# Patient Record
Sex: Male | Born: 1935
Health system: Southern US, Community
[De-identification: ages and names within clinical notes are randomized; demographics above are authoritative.]

## PROBLEM LIST (undated history)

## (undated) DIAGNOSIS — M1990 Unspecified osteoarthritis, unspecified site: Secondary | ICD-10-CM

## (undated) DIAGNOSIS — I10 Essential (primary) hypertension: Secondary | ICD-10-CM

## (undated) DIAGNOSIS — I2510 Atherosclerotic heart disease of native coronary artery without angina pectoris: Secondary | ICD-10-CM

## (undated) DIAGNOSIS — I219 Acute myocardial infarction, unspecified: Secondary | ICD-10-CM

## (undated) DIAGNOSIS — I209 Angina pectoris, unspecified: Secondary | ICD-10-CM

## (undated) DIAGNOSIS — I4892 Unspecified atrial flutter: Secondary | ICD-10-CM

## (undated) DIAGNOSIS — N189 Chronic kidney disease, unspecified: Secondary | ICD-10-CM

## (undated) DIAGNOSIS — E785 Hyperlipidemia, unspecified: Secondary | ICD-10-CM

## (undated) DIAGNOSIS — E7800 Pure hypercholesterolemia, unspecified: Secondary | ICD-10-CM

## (undated) HISTORY — DX: Pure hypercholesterolemia, unspecified: E78.00

## (undated) HISTORY — DX: Atherosclerotic heart disease of native coronary artery without angina pectoris: I25.10

## (undated) HISTORY — PX: OTHER SURGICAL HISTORY: SHX169

## (undated) HISTORY — DX: Hyperlipidemia, unspecified: E78.5

## (undated) HISTORY — DX: Unspecified atrial flutter: I48.92

---

## 2004-12-29 ENCOUNTER — Ambulatory Visit: Payer: Self-pay | Admitting: Cardiology

## 2006-04-18 ENCOUNTER — Ambulatory Visit: Payer: Self-pay | Admitting: Cardiology

## 2006-05-11 ENCOUNTER — Emergency Department (HOSPITAL_COMMUNITY): Admission: EM | Admit: 2006-05-11 | Discharge: 2006-05-11 | Payer: Self-pay | Admitting: Emergency Medicine

## 2006-05-24 ENCOUNTER — Emergency Department (HOSPITAL_COMMUNITY): Admission: EM | Admit: 2006-05-24 | Discharge: 2006-05-24 | Payer: Self-pay | Admitting: *Deleted

## 2007-02-11 ENCOUNTER — Ambulatory Visit: Payer: Self-pay | Admitting: Cardiology

## 2007-02-12 ENCOUNTER — Encounter: Payer: Self-pay | Admitting: Cardiology

## 2008-06-24 ENCOUNTER — Encounter: Payer: Self-pay | Admitting: Physician Assistant

## 2008-06-24 ENCOUNTER — Ambulatory Visit: Payer: Self-pay | Admitting: Cardiology

## 2008-06-30 ENCOUNTER — Encounter: Payer: Self-pay | Admitting: Physician Assistant

## 2008-09-22 ENCOUNTER — Encounter: Payer: Self-pay | Admitting: Cardiology

## 2009-10-18 ENCOUNTER — Ambulatory Visit: Payer: Self-pay | Admitting: Cardiology

## 2009-10-18 DIAGNOSIS — E7800 Pure hypercholesterolemia, unspecified: Secondary | ICD-10-CM

## 2009-10-18 DIAGNOSIS — I2510 Atherosclerotic heart disease of native coronary artery without angina pectoris: Secondary | ICD-10-CM

## 2009-10-25 ENCOUNTER — Encounter: Payer: Self-pay | Admitting: Cardiology

## 2009-10-28 ENCOUNTER — Encounter: Payer: Self-pay | Admitting: Cardiology

## 2010-01-03 ENCOUNTER — Encounter: Payer: Self-pay | Admitting: Cardiology

## 2010-01-03 ENCOUNTER — Encounter (INDEPENDENT_AMBULATORY_CARE_PROVIDER_SITE_OTHER): Payer: Self-pay | Admitting: *Deleted

## 2010-01-10 ENCOUNTER — Encounter: Payer: Self-pay | Admitting: Cardiology

## 2010-04-12 ENCOUNTER — Encounter: Payer: Self-pay | Admitting: Cardiology

## 2010-05-22 ENCOUNTER — Encounter: Payer: Self-pay | Admitting: Cardiology

## 2010-09-26 NOTE — Miscellaneous (Signed)
Summary: remove caduet  Clinical Lists Changes  Medications: Removed medication of CADUET 10-20 MG TABS (AMLODIPINE-ATORVASTATIN) Take 1 tablet by mouth once a day

## 2010-09-26 NOTE — Miscellaneous (Signed)
Summary: rx - amlodipine, lipitor  Clinical Lists Changes  Medications: Changed medication from AMLODIPINE BESYLATE 5 MG TABS (AMLODIPINE BESYLATE) Take 1 tablet by mouth once a day to AMLODIPINE BESYLATE 5 MG TABS (AMLODIPINE BESYLATE) Take 1 tablet by mouth once a day - Signed Added new medication of LIPITOR 80 MG TABS (ATORVASTATIN CALCIUM) Take 1 tab by mouth at bedtime - Signed Rx of AMLODIPINE BESYLATE 5 MG TABS (AMLODIPINE BESYLATE) Take 1 tablet by mouth once a day;  #30 x 6;  Signed;  Entered by: Hoover Brunette, LPN;  Authorized by: Lewayne Bunting, MD, [facility];  Method used: Electronically to [facility] [phone]*, [address] [place], [place], [place]  [address], Ph: [phone] or [phone], Fax: [phone] Rx of LIPITOR 80 MG TABS (ATORVASTATIN CALCIUM) Take 1 tab by mouth at bedtime;  #30 x 6;  Signed;  Entered by: Hoover Brunette, LPN;  Authorized by: Lewayne Bunting, MD, [facility];  Method used: Electronically to [facility] [phone]*, [address], [place], [place], [place]  [address], Ph: [phone] or [phone], Fax: [phone]    Prescriptions: LIPITOR 80 MG TABS (ATORVASTATIN CALCIUM) Take 1 tab by mouth at bedtime  #30 x 6   Entered by:   Hoover Brunette, LPN   Authorized by:   Lewayne Bunting, MD, [facility]   Signed by:   Hoover Brunette, LPN on 36/14/4315   Method used:   Electronically to        [facility] [phone]* (retail)       [address] [place], [place]  [address]       Ph: [phone] or [phone]       Fax: [phone]   RxID:   [id number] AMLODIPINE BESYLATE 5 MG TABS (AMLODIPINE BESYLATE) Take 1 tablet by mouth once a day  #30 x 6   Entered by:   Hoover Brunette, LPN   Authorized by:   Lewayne Bunting, MD, [facility]   Signed by:   Hoover Brunette, LPN on 92/42/6834   Method used:   Electronically to        [facility] [phone]* (retail)       [address] [place], [place]  [address]       Ph:  [phone] or [phone]       Fax: [phone]   RxID:   [id number]

## 2010-09-26 NOTE — Assessment & Plan Note (Signed)
Summary: 6 mo fu that was due in april   Visit Type:  Follow-up Primary Provider:  none  CC:  follow-up visit.  History of Present Illness: the patient is 77 year old male with multiple cardiac risk factors. In 2008 the patient an abnormal stress Cardiolite study but refused cardiac catheterization on multiple occasions. From cardiac standpoint is actually been doing quite well. He denies any recurrent chest pain shortness of breath orthopnea PND. The patient continues to work and experienced no limitations in his lifestyle. He reports no palpitations presyncope or syncope. The patient has not had any recent blood work. His vital signs today were stable. He does report that he has difficulty affording Caduet  Preventive Screening-Counseling & Management  Alcohol-Tobacco     Smoking Status: never  Current Problems (verified): 1)  Pure Hypercholesterolemia  (ICD-272.0) 2)  Coronary Atherosclerosis Native Coronary Artery  (ICD-414.01)  Current Medications (verified): 1)  Amlodipine Besylate 5 Mg Tabs (Amlodipine Besylate) .... Take 1 Tablet By Mouth Once A Day 2)  Caduet 10-20 Mg Tabs (Amlodipine-Atorvastatin) .... Take 1 Tablet By Mouth Once A Day 3)  Hydrochlorothiazide 25 Mg Tabs (Hydrochlorothiazide) .... Take 1 Tablet By Mouth Once A Day 4)  Isosorbide Dinitrate 20 Mg Tabs (Isosorbide Dinitrate) .... Take 1 Tablet By Mouth Twice A Day 5)  Metoprolol Tartrate 50 Mg Tabs (Metoprolol Tartrate) .... Take 1 Tablet By Mouth Twice A Day 6)  Fish Oil 1000 Mg Caps (Omega-3 Fatty Acids) .... Take 2 Tablet By Mouth Two Times A Day 7)  Aspir-Low 81 Mg Tbec (Aspirin) .... Take 1 Tablet By Mouth Once A Day 8)  Allopurinol 300 Mg Tabs (Allopurinol) .... Take 1 Tablet By Mouth Once A Day 9)  Doxazosin Mesylate 8 Mg Tabs (Doxazosin Mesylate) .... Take 1 Tablet By Mouth Once A Day  Allergies (verified): No Known Drug Allergies  Comments:  Nurse/Medical Assistant: The patient's medications and  allergies were reviewed with the patient and were updated in the Medication and Allergy Lists. List reviewed.  Past History:  Past Medical History: Last updated: 06/23/2008   Abnormal Cardiolite stress study with presumed coronary artery disease, asymptomatic patient declines cardiac catheterization in the past hypertension, uncontrolled obesity chronic venous insufficiency history of alcohol use.  Family History: noncontributory  Social History: Tobacco Use - No.  Smoking Status:  never  Review of Systems  The patient denies fatigue, malaise, fever, weight gain/loss, vision loss, decreased hearing, hoarseness, chest pain, palpitations, shortness of breath, prolonged cough, wheezing, sleep apnea, coughing up blood, abdominal pain, blood in stool, nausea, vomiting, diarrhea, heartburn, incontinence, blood in urine, muscle weakness, joint pain, leg swelling, rash, skin lesions, headache, fainting, dizziness, depression, anxiety, enlarged lymph nodes, easy bruising or bleeding, and environmental allergies.    Vital Signs:  Patient profile:   77 year old male Height:      71 inches Weight:      305 pounds BMI:     42.69 Pulse rate:   72 / minute BP sitting:   136 / 78  (left arm) Cuff size:   large  Vitals Entered By: Carlye Grippe (October 18, 2009 10:58 AM)  Nutrition Counseling: Patient's BMI is greater than 25 and therefore counseled on weight management options. CC: follow-up visit   Physical Exam  Additional Exam:  General: Well-developed, well-nourished in no distress head: Normocephalic and atraumatic eyes PERRLA/EOMI intact, conjunctiva and lids normal nose: No deformity or lesions mouth normal dentition, normal posterior pharynx neck: Supple, no JVD.  No masses,  thyromegaly or abnormal cervical nodes lungs: Normal breath sounds bilaterally without wheezing.  Normal percussion heart: regular rate and rhythm with normal S1 and S2, no S3 or S4.  PMI is normal.   No pathological murmurs abdomen: Normal bowel sounds, abdomen is soft and nontender without masses, organomegaly or hernias noted.  No hepatosplenomegaly musculoskeletal: Back normal, normal gait muscle strength and tone normal pulsus: Pulse is normal in all 4 extremities Extremities: No peripheral pitting edema neurologic: Alert and oriented x 3 skin: Intact without lesions or rashes cervical nodes: No significant adenopathy psychologic: Normal affect    EKG  Procedure date:  10/18/2009  Findings:      normal sinus rhythm. Heart rate 66 beats per minute nonspecific ST-T wave changes.  Impression & Recommendations:  Problem # 1:  CORONARY ATHEROSCLEROSIS NATIVE CORONARY ARTERY (ICD-414.01) patient had several years ago and abnormal cardioid study but he denies any chest pain. He continues to decline cardiac catheterization although there is no clear in addition to proceed with one currently. His updated medication list for this problem includes:    Amlodipine Besylate 5 Mg Tabs (Amlodipine besylate) .Marland Kitchen... Take 1 tablet by mouth once a day    Isosorbide Dinitrate 20 Mg Tabs (Isosorbide dinitrate) .Marland Kitchen... Take 1 tablet by mouth twice a day    Metoprolol Tartrate 50 Mg Tabs (Metoprolol tartrate) .Marland Kitchen... Take 1 tablet by mouth twice a day    Aspir-low 81 Mg Tbec (Aspirin) .Marland Kitchen... Take 1 tablet by mouth once a day  Orders: T-CBC No Diff ([id number]) T-Comprehensive Metabolic Panel (14782-95621) T-PSA (30865-78469) T-TSH (62952-84132) T-Lipid Profile (44010-27253)  Problem # 2:  PURE HYPERCHOLESTEROLEMIA (ICD-272.0) the patient did not have blood work in at least a year. He also does not have a primary care physician. I ordered blood work as listed below. His updated medication list for this problem includes:    Caduet 10-20 Mg Tabs (Amlodipine-atorvastatin) .Marland Kitchen... Take 1 tablet by mouth once a day  Orders: T-CBC No Diff ([id number]) T-Comprehensive Metabolic Panel  (59563-87564) T-PSA (33295-18841) T-TSH (66063-01601) T-Lipid Profile (09323-55732)  Other Orders: EKG w/ Interpretation (93000)  Patient Instructions: 1)  Your physician recommends that you continue on your current medications as directed. Please refer to the Current Medication list given to you today. 2)  Labs - can do next week.   3)  Follow up in  1 year.

## 2010-09-26 NOTE — Miscellaneous (Signed)
Summary: Orders Update - FLP,LFT  Clinical Lists Changes  Orders: Added new Test order of T-Lipid Profile (80061-22930) - Signed Added new Test order of T-Hepatic Function (80076-22960) - Signed 

## 2010-09-26 NOTE — Miscellaneous (Signed)
Summary: Orders Update - FLP/LFT  Clinical Lists Changes  Orders: Added new Test order of T-Lipid Profile (80061-22930) - Signed Added new Test order of T-Hepatic Function (80076-22960) - Signed 

## 2010-09-26 NOTE — Letter (Signed)
Summary: Generic Engineer, agricultural at [facility] [address]   [place], [place] [address]   Phone: [phone]  Fax: [phone]        Jan 03, 2010 MRN: [id number]    [facility] [address] [place], [place]  [address]    Dear [name],  According to our records, it is now time for your follow up lab work.  Please take the enclosed order to the [facility] at your earliest convenience.   Reminder:  Nothing to eat or drink after 12 midnight prior to labs.       Sincerely,  Hoover Brunette, LPN  This letter has been electronically signed by your physician.

## 2011-01-04 ENCOUNTER — Emergency Department (INDEPENDENT_AMBULATORY_CARE_PROVIDER_SITE_OTHER): Payer: Medicare Other

## 2011-01-04 ENCOUNTER — Emergency Department (HOSPITAL_BASED_OUTPATIENT_CLINIC_OR_DEPARTMENT_OTHER)
Admission: EM | Admit: 2011-01-04 | Discharge: 2011-01-04 | Disposition: A | Discharge status: Other Acute Inpatient Hospital | Payer: Medicare Other | Source: Home / Self Care | Attending: Emergency Medicine | Admitting: Emergency Medicine

## 2011-01-04 ENCOUNTER — Inpatient Hospital Stay (HOSPITAL_COMMUNITY)
Admission: AD | Admit: 2011-01-04 | Discharge: 2011-01-06 | DRG: 872 | Disposition: A | Discharge status: Home / Self Care | Payer: Medicare Other | Source: Other Acute Inpatient Hospital | Attending: Internal Medicine | Admitting: Internal Medicine

## 2011-01-04 DIAGNOSIS — R0602 Shortness of breath: Secondary | ICD-10-CM | POA: Insufficient documentation

## 2011-01-04 DIAGNOSIS — N400 Enlarged prostate without lower urinary tract symptoms: Secondary | ICD-10-CM | POA: Diagnosis present

## 2011-01-04 DIAGNOSIS — N390 Urinary tract infection, site not specified: Secondary | ICD-10-CM | POA: Insufficient documentation

## 2011-01-04 DIAGNOSIS — R509 Fever, unspecified: Secondary | ICD-10-CM | POA: Insufficient documentation

## 2011-01-04 DIAGNOSIS — A419 Sepsis, unspecified organism: Secondary | ICD-10-CM | POA: Diagnosis present

## 2011-01-04 DIAGNOSIS — I10 Essential (primary) hypertension: Secondary | ICD-10-CM | POA: Insufficient documentation

## 2011-01-04 DIAGNOSIS — M109 Gout, unspecified: Secondary | ICD-10-CM | POA: Diagnosis present

## 2011-01-04 DIAGNOSIS — R55 Syncope and collapse: Secondary | ICD-10-CM | POA: Insufficient documentation

## 2011-01-04 DIAGNOSIS — J984 Other disorders of lung: Secondary | ICD-10-CM | POA: Diagnosis present

## 2011-01-04 DIAGNOSIS — E876 Hypokalemia: Secondary | ICD-10-CM | POA: Diagnosis present

## 2011-01-04 DIAGNOSIS — E785 Hyperlipidemia, unspecified: Secondary | ICD-10-CM | POA: Diagnosis present

## 2011-01-04 DIAGNOSIS — I2510 Atherosclerotic heart disease of native coronary artery without angina pectoris: Secondary | ICD-10-CM | POA: Diagnosis present

## 2011-01-04 DIAGNOSIS — E860 Dehydration: Secondary | ICD-10-CM | POA: Diagnosis present

## 2011-01-04 DIAGNOSIS — A4151 Sepsis due to Escherichia coli [E. coli]: Principal | ICD-10-CM | POA: Diagnosis present

## 2011-01-04 DIAGNOSIS — R5381 Other malaise: Secondary | ICD-10-CM | POA: Diagnosis present

## 2011-01-04 LAB — COMPREHENSIVE METABOLIC PANEL
Albumin: 3.5 g/dL (ref 3.5–5.2)
Alkaline Phosphatase: 72 U/L (ref 39–117)
BUN: 16 mg/dL (ref 6–23)
Creatinine, Ser: 1.1 mg/dL (ref 0.4–1.5)
Glucose, Bld: 136 mg/dL — ABNORMAL HIGH (ref 70–99)
Potassium: 3.1 mEq/L — ABNORMAL LOW (ref 3.5–5.1)
Total Bilirubin: 2.1 mg/dL — ABNORMAL HIGH (ref 0.3–1.2)
Total Protein: 7.6 g/dL (ref 6.0–8.3)

## 2011-01-04 LAB — CBC
HCT: 39.6 % (ref 39.0–52.0)
MCH: 31 pg (ref 26.0–34.0)
MCV: 91 fL (ref 78.0–100.0)
Platelets: 156 10*3/uL (ref 150–400)
RDW: 13.6 % (ref 11.5–15.5)

## 2011-01-04 LAB — DIFFERENTIAL
Eosinophils Absolute: 0 10*3/uL (ref 0.0–0.7)
Eosinophils Relative: 0 % (ref 0–5)
Lymphocytes Relative: 10 % — ABNORMAL LOW (ref 12–46)
Lymphs Abs: 1.3 10*3/uL (ref 0.7–4.0)
Monocytes Absolute: 1.8 10*3/uL — ABNORMAL HIGH (ref 0.1–1.0)
Monocytes Relative: 13 % — ABNORMAL HIGH (ref 3–12)

## 2011-01-04 LAB — URINALYSIS, ROUTINE W REFLEX MICROSCOPIC
Glucose, UA: NEGATIVE mg/dL
Hgb urine dipstick: NEGATIVE
Protein, ur: 100 mg/dL — AB
Specific Gravity, Urine: 1.033 — ABNORMAL HIGH (ref 1.005–1.030)
Urobilinogen, UA: 2 mg/dL — ABNORMAL HIGH (ref 0.0–1.0)

## 2011-01-04 LAB — URINE MICROSCOPIC-ADD ON

## 2011-01-05 LAB — CBC
HCT: 38.1 % — ABNORMAL LOW (ref 39.0–52.0)
Hemoglobin: 12.7 g/dL — ABNORMAL LOW (ref 13.0–17.0)
MCH: 30.8 pg (ref 26.0–34.0)
MCV: 92.5 fL (ref 78.0–100.0)
RBC: 4.12 MIL/uL — ABNORMAL LOW (ref 4.22–5.81)
WBC: 11.8 10*3/uL — ABNORMAL HIGH (ref 4.0–10.5)

## 2011-01-05 LAB — BASIC METABOLIC PANEL
BUN: 14 mg/dL (ref 6–23)
Calcium: 8.4 mg/dL (ref 8.4–10.5)
Creatinine, Ser: 0.98 mg/dL (ref 0.4–1.5)
GFR calc non Af Amer: >60 mL/min (ref >60)
Glucose, Bld: 103 mg/dL — ABNORMAL HIGH (ref 70–99)
Potassium: 3 mEq/L — ABNORMAL LOW (ref 3.5–5.1)

## 2011-01-05 LAB — HEPATIC FUNCTION PANEL
ALT: 13 U/L (ref 0–53)
AST: 16 U/L (ref 0–37)
Alkaline Phosphatase: 67 U/L (ref 39–117)
Bilirubin, Direct: 0.4 mg/dL — ABNORMAL HIGH (ref 0.0–0.3)
Indirect Bilirubin: 1 mg/dL — ABNORMAL HIGH (ref 0.3–0.9)
Total Bilirubin: 1.4 mg/dL — ABNORMAL HIGH (ref 0.3–1.2)

## 2011-01-05 LAB — POTASSIUM: Potassium: 3.1 mEq/L — ABNORMAL LOW (ref 3.5–5.1)

## 2011-01-06 LAB — CBC
HCT: 39 % (ref 39.0–52.0)
Platelets: 157 10*3/uL (ref 150–400)
RBC: 4.17 MIL/uL — ABNORMAL LOW (ref 4.22–5.81)
RDW: 13.4 % (ref 11.5–15.5)
WBC: 6.9 10*3/uL (ref 4.0–10.5)

## 2011-01-06 LAB — BASIC METABOLIC PANEL
Chloride: 104 mEq/L (ref 96–112)
GFR calc non Af Amer: >60 mL/min (ref >60)
Glucose, Bld: 86 mg/dL (ref 70–99)
Potassium: 3.4 mEq/L — ABNORMAL LOW (ref 3.5–5.1)
Sodium: 140 mEq/L (ref 135–145)

## 2011-01-06 LAB — URINE CULTURE: Colony Count: >=100000

## 2011-01-09 NOTE — Assessment & Plan Note (Signed)
[facility]                          [facility] OFFICE NOTE   NAME:Marcus [name], Marcus [name]                        MRN:          [id number]  DATE:06/24/2008                            DOB:          November 06, 1932    PRIMARY CARDIOLOGIST:  Learta Codding, MD, [facility]   REASON FOR VISIT:  Annual followup.   Marcus [name] denies any interim development of angina pectoris, since last  seen here in June 2008.  He has multiple cardiac risk factors and  presumed CAD, with history of an abnormal stress Cardiolite study.  However, he has refused a cardiac catheterization on multiple occasions.   Of note, Marcus [name] has never smoked tobacco.  He has not had any blood  work since his last office visit.  At that time, he had normal  electrolytes and mild renal insufficiency with a creatinine of 1.4.  TSH  was normal.  Hemoglobin was normal, as well.   EKG today reveals NSR at 63 bpm with left axis deviation and nonspecific  ST abnormalities; no significant change from his previous study.   CURRENT MEDICATIONS:  1. Isosorbide 20 b.i.d.  2. Hydrochlorothiazide 25 daily.  3. Fish oil 1000 b.i.d.  4. Ecotrin 500 b.i.d.  5. Allopurinol 300 daily.  6. Caduet 10/20 daily.  7. Doxycin daily.  8. Metoprolol 50.   PHYSICAL EXAMINATION:  Blood pressure 153/79, pulse 69, regular, weight  309 (down 4).  GENERAL:  A 77 year old male, obese, sitting upright, in no distress.  HEENT:  Normocephalic, atraumatic.  NECK:  Palpable carotid pulses without bruits; no JVD at 90 degrees.  LUNGS:  Clear to auscultation in all fields.  HEART:  Regular rate and rhythm.  No significant murmurs.  No rubs.  ABDOMEN:  Protuberant, intact bowel sounds.  EXTREMITIES:  Palpable posterior tibialis pulses with 1+, bilateral  nonpitting edema.  NEUROLOGIC:  No focal deficit.   IMPRESSION:  1. Presumed coronary artery disease, quiescent.      a.     History of abnormal stress Cardiolite, June, 1998;  refused       cardiac catheterization on multiple occasions.  2. Hypertension.  3. Dyslipidemia.  4. Chronic venous insufficiency.  5. Obesity.   PLAN:  1. Add amlodipine 5 mg daily for more aggressive blood pressure      control.  2. Surveillance blood work with CMET, CBC, and fasting lipid profile.  3. Schedule return clinic and followup with myself and Dr. Andee Lineman in 6      months.      Gene Serpe, PA-C  Electronically Signed      Learta Codding, MD,FACC  Electronically Signed   GS/MedQ  DD: 06/24/2008  DT: 06/25/2008  Job #: [id number]

## 2011-01-09 NOTE — Assessment & Plan Note (Signed)
[facility]                          [facility] NOTE   NAME:Marcus [name], Marcus [name]                        MRN:          [id number]  DATE:02/11/2007                            DOB:          1933/08/03    HISTORY OF PRESENT ILLNESS:  The patient is a 77 year old male with a  history of abnormal Cardiolite study. The patient has presumed  underlying coronary artery disease but has refused on multiple occasions  a cardiac catheterization.  Again today, I raised the question about  repeat catheterization, the patient adamantly refuses.  He denies  however, any shortness of breath.  He has no chest pain.  His EKG in the  office demonstrated normal sinus rhythm, nonspecific ST-T wave changes.   MEDICATIONS:  1. Isosorbide mononitrate 20 mg p.o. b.i.d.  2. Metoprolol 100 mg p.o. q. day.  3. Hydrochlorothiazide 25 q. day.  4. Fish oil __________ p.o. q. day.  5. Ecotrin __________ mg p.o. b.i.d.  6. Allopurinol 300 mg q. day.  7. Caduet 10/20 daily.  8. Doxazosin 8 mg p.o. q. day.   PHYSICAL EXAMINATION:  VITAL SIGNS:  Blood pressure is 168/84, heart is  74 beats per minute.  Weight is 113 pounds.  NECK:  Normal carotid upstroke and carotid bruits.  LUNGS:  Clear breath sounds bilaterally.  HEART:  Regular rate and rhythm, normal S1, S2.  No murmurs, rubs or  gallops.  ABDOMEN:  Soft and tender. No rebound or guarding.  Good bowel sounds.  EXTREMITIES:  No cyanosis, clubbing or edema.   PROBLEM LIST:  1. Abnormal Cardiolite stress study with presumed coronary artery      disease, asymptomatic.  2. Patient declined catheterization in the past.  3. Hypertension, uncontrolled.  4. Obesity.  5. Chronic venous insufficiency.  6. History of alcohol use.   PLAN:  1. I have increased the patient's Imdur to 40 mg p.o. b.i.d. given his      elevated blood pressure.  2. The patient continues to decline any further workup with cardiac  catheterization.      Laboratory work will be done today to check the patient's liver      function tests and lipid panel.     Learta Codding, MD,FACC  Electronically Signed    GED/MedQ  DD: 02/11/2007  DT: 02/12/2007  Job #: [id number]

## 2011-01-10 LAB — CULTURE, BLOOD (ROUTINE X 2)
Culture  Setup Time: 201205101345
Culture: NO GROWTH
Culture: NO GROWTH

## 2011-01-11 NOTE — Discharge Summary (Signed)
NAME:  Marcus [name], Marcus [name]                 ACCOUNT NO.:  [id number]  MEDICAL RECORD NO.:  [id number]           PATIENT TYPE:  I  LOCATION:  1426                         FACILITY:  [facility]  PHYSICIAN:  Thad Ranger, MD       DATE OF BIRTH:  Feb 10, 1933  DATE OF ADMISSION:  01/04/2011 DATE OF DISCHARGE:                        DISCHARGE SUMMARY - REFERRING   PRIMARY CARE PHYSICIAN:  J. Darreld Mclean, M.D.  UROLOGIST:  Excell Seltzer. Annabell Howells, M.D.  DISCHARGE DIAGNOSES: 1. Escherichia coli urinary tract infection. 2. SIRS, improved . 3. Hypertension. 4. Hypokalemia. 5. History of coronary disease. 6. Benign prostatic hypertrophy. 7. History of gout. 8. Hypertension. 9. Dyslipidemia.  MEDICATIONS: 1. Keflex 500 mg p.o. b.i.d. for another 5 days. 2. Allopurinol 300 mg p.o. daily. 3. Amlodipine 5 mg p.o. daily. 4. Aspirin 81 mg p.o. daily. 5. Atorvastatin 80 mg p.o. daily. 6. Doxazosin 8 mg p.o. daily. 7. Fish oil 4 capsules p.o. b.i.d. 8. Metoprolol 50 mg p.o. b.i.d. 9. Ciprofloxacin 500 mg p.o. b.i.d. for 5 days for the sensitivities. 10.Isosorbide 20 mg p.o. b.i.d.  The patient was counseled to hold the following medication until his followup with the primary care physician:  Hydrochlorothiazide.  HISTORY OF PRESENT ILLNESS:  At the time of admission, Marcus [name] is a 77- year-old gentleman with past medical history of CAD, BPH who was having chills with fevers for the last 2 days prior to admission.  He had a temperature of 102.3 the day before the admission.  On the day of admission, he woke up and felt really weak to the point that he fell down.  He denied any loss of consciousness.  He remembered falling down due to weakness.  He also reported dysuria and urinary frequency for 2 to 3 days prior to the admission.  For details, please refer to admission note dictated by Dr. Zannie Cove on Jan 04, 2011.  RADIOLOGICAL DATA:  Chest x-ray, Jan 04, 2011, elevated right hemidiaphragm with  right base atelectasis, mild cardiomegaly.  LABORATORY DIAGNOSTIC DATA:  At the time of admission, CBC showed white count of 14.1, hemoglobin 13.5, hematocrit of 39.6, platelets 156.  CMP, sodium 134, potassium 3.1, BUN 16, and creatinine 1.1.  At the time of discharge, sodium 140, potassium 3.4, BUN 14, creatinine 0.9.  CBC showed improved white count at 6.9, hemoglobin 12.7, hematocrit 39.0 and platelets 157.  Blood culture remained negative till date.  Urine culture showed more than 100,000 colonies of E-coli pansensitive.  BRIEF HOSPITALIZATION COURSE:  Marcus [name] is a 77 year old male who presented with urinary tract infection and severe acute respiratory syndrome. 1. SIRS:.  The patient was noted to have UTI and with elevated white count and fevers.  He was admitted to     the monitored floor and also placed on IV fluids. 2. Escherichia coli UTI.  The patient was continued on Rocephin.     Urine culture did show E coli pansensitive and for sensitivities,     the patient will be discharged on ciprofloxacin for another 5 daysto complete the course for 7 days due to his history of BPH.  I     also recommended him to follow up with Dr. Annabell Howells within next 1 to 2     weeks. 3. Hypokalemia.  This was replaced. 4. Hypertension.  During the hospitalization, hydrochlorothiazide and     Imdur were placed on hold.  DISCHARGE FOLLOWUP:  With Dr. Hilda Lias within the next 1 to 2 weeks and Dr. Annabell Howells within next 1 to 2 weeks.  DISCHARGE PHYSICAL EXAMINATION:  VITAL SIGNS:  At the time of discharge, temperature 97.8, pulse  64, respirations 18, blood pressure 124/67, O2 sats 97% on room air. GENERAL:  The patient is alert, awake and oriented x3, not in acute distress. HEENT:  Anicteric sclerae.  Pink conjunctivae.  Pupils reactive to light and accommodation.  EOMI. NECK:  Supple.  No lymphadenopathy, no JVD. CVS:  S1, S2.  Clear.  Regular rate and rhythm. CHEST:  Clear to auscultation  bilaterally. ABDOMEN:  Soft, nontender, nondistended.  Normal bowel sounds. EXTREMITIES:  No cyanosis, clubbing or edema noted in upper or lower extremities bilaterally. NEURO:  No focal neurological deficits noted.  DISCHARGE TIME:  35 minutes.     Thad Ranger, MD     RR/MEDQ  D:  01/06/2011  T:  01/06/2011  Job:  [id number]  cc:   Teola Bradley, M.D. Fax: [phone]  Excell Seltzer. Annabell Howells, M.D. Fax: [phone]  Electronically Signed by Bharat Antillon  on 01/11/2011 01:34:59 PM

## 2011-01-12 NOTE — Assessment & Plan Note (Signed)
[facility]                            [facility]ICE NOTE   NAME:Marcus [name], Marcus [name]                        MRN:          [id number]  DATE:04/18/2006                            DOB:          03-05-1933    HISTORY OF PRESENT ILLNESS:  The patient is a 77 year old gentleman who  never smoked, who has been followed by Arnette Felts in the past.  It is  presumed that he has underlying coronary artery disease based on an abnormal  exercise Cardiolite study in 1998.  The patient, however, denies any  substernal chest pain.  He has no shortness of breath.  He does report lower  extremity edema.  His blood pressure is poorly controlled in the office  today and is measured 150/90.  His EKG, however, showed no acute ischemic  changes.   MEDICATIONS:  1. Isordil 20 mg p.o. t.i.d.  2. Toprol XL 100 mg p.o. q. day.  3. Allopurinol.  4. Fish oil.  5. Ecotrin.  6. Caduet 10/20.   PHYSICAL EXAMINATION:  VITAL SIGNS:  Blood pressure 150/90, heart rate 59.  NECK:  Normal carotid upstrokes, no carotid bruits.  LUNGS:  Clear breath sounds bilaterally.  HEART:  Regular rate and rhythm, normal S1, S2.  ABDOMEN:  Soft.  EXTREMITIES:  2+ peripheral pitting edema.  NEURO:  Patient is alert, grossly nonfocal.   EKG:  Sinus bradycardia, [place] Q-waves in V1, no acute ishemic change.   PROBLEM:  1. Probable coronary artery disease, asymptomatic.  2. Dyslipidemia.  3. Hypertension, uncontrolled.  4. Obesity.  5. Gout.  6. Chronic venous insufficiency.  7. History of alcohol use.   PLAN:  1. The patient is doing quite well.  His functional status is good.  He      has no chest pain or shortness of breath.  2. The patient continues to decline further workup based on his prior      abnormal Cardiolite stress study.  3. His blood pressure is poorly controlled, and I have added      hydrochlorothiazide to his medical regimen today at 25 mg a day.  The      next  visit, we can potentially discontinue Caduet and change it to a      generic medication, given his financial situation.   PLAN:  The patient will follow up with [place] in 6 months.                                   Learta Codding, MD, [facility]   GED/MedQ  DD:  04/18/2006  DT:  04/18/2006  Job #:  [id number]

## 2011-01-15 ENCOUNTER — Other Ambulatory Visit: Payer: Self-pay | Admitting: *Deleted

## 2011-01-15 MED ORDER — AMLODIPINE BESYLATE 5 MG PO TABS: 5.0000 mg | ORAL_TABLET | Freq: Every day | ORAL | Status: DC

## 2011-01-15 MED ORDER — ATORVASTATIN CALCIUM 80 MG PO TABS: 80.0000 mg | ORAL_TABLET | Freq: Every day | ORAL | Status: DC

## 2011-01-15 NOTE — H&P (Signed)
NAME:  Molstad, Nesanel                 ACCOUNT NO.:  [id number]  MEDICAL RECORD NO.:  [id number]           PATIENT TYPE:  I  LOCATION:  1426                         FACILITY:  [facility]  PHYSICIAN:  Zannie Cove, MD     DATE OF BIRTH:  [birth year]  DATE OF ADMISSION:  01/04/2011 DATE OF DISCHARGE:                             HISTORY & PHYSICAL   PRIMARY CARE PHYSICIAN:  J. Darreld Mclean, MD  CARDIOLOGIST:  Learta Codding, MD, [facility]  UROLOGIST:  Excell Seltzer. Annabell Howells, MD  CHIEF COMPLAINT:  Fever and weakness.  HISTORY OF PRESENT ILLNESS:  Marcus [name] is a 77 year old gentleman with a past history of presumed CAD, BPH, who for the last 2 days has been having severe chills associated with fevers.  He had a temperature of 102.3 yesterday, took Tylenol, and went to bed.  This morning, woke up and was feeling really weak, too weak to the point that he fell down. He denies loss of consciousness.  He remembers falling down due to weakness.  He also reports dysuria and urinary frequency for the last 2- 3 days and change in the color of his urine.  He has prior history of BPH and is followed by Dr. Bjorn Pippin for this.  In addition, he also reports lower back pain for the last couple of days.  PAST MEDICAL HISTORY: 1. Suspected CAD. 2. Hypertension. 3. Dyslipidemia. 4. BPH. 5. Gout.  MEDICATIONS: 1. Allopurinol 300 mg daily. 2. Doxazosin 8 mg daily. 3. Fish oil over-the-counter 4 capsules b.i.d. 4. Atorvastatin 80 mg daily. 5. Hydrochlorothiazide 25 mg daily. 6. Metoprolol tartrate 50 mg b.i.d. 7. Amlodipine 5 mg daily. 8. Isosorbide dinitrate 20 mg p.o. b.i.d. 9. Aspirin 81 mg daily.  ALLERGIES:  No known drug allergies.  SOCIAL HISTORY:  He is married, lives at home with his wife.  No history of alcohol or tobacco use.  FAMILY HISTORY:  Noncontributory.  REVIEW OF SYSTEMS:  Negative except per HPI.  PHYSICAL EXAMINATION:  VITAL SIGNS:  T-max was 101.4, blood pressure 145/57, the  lowest in the [facility] was 97/50, heart rate 92, respirations 20s, and saturating 95% on 2 L. GENERAL:  He is an obese gentleman laying in bed in no acute distress. HEENT:  Pupils round and reactive to light.  Extraocular movements intact.  Oral mucosa dry. NECK:  No JVD or lymphadenopathy. CARDIOVASCULAR SYSTEM:  S1-S2.  Regular rate and rhythm. LUNGS:  Clear to auscultation bilaterally. ABDOMEN:  Soft, obese, nontender with positive bowel sounds.  No organomegaly. EXTREMITIES:  No edema, clubbing, or cyanosis.  No CVA or flank tenderness. NEURO:  Nonfocal.  LABORATORY DATA:  White count of 14.1 with a left shift.  BMET with sodium of 134, potassium 3.1, chloride 94, bicarb 26, BUN 16, creatinine 1.1, and glucose 136.  Total bili is 2.1.  Calcium 9.5.  Lactic acid and procalcitonin within normal limits.  Urine is cloudy with positive nitrite, leukocyte esterase, numerous wbc's, and many bacteria.  Chest x-ray shows elevated right hemidiaphragm with right base atelectasis and mild cardiomegaly.  ASSESSMENT AND PLAN:  Mr. Dames  is a 77 year old gentleman with: 1. Sepsis. 2. Urinary tract infection. 3. Hypertension. 4. Coronary artery disease. 5. Hypokalemia.  PLAN:  We will start him on IV ceftriaxone along with IV fluids.  Check urine culture and tailor antibiotics appropriately.  We will continue most of his antihypertensives except hold his Imdur for now.  Also continue doxazosin.  Once he has clinically improved, will need followup with his urologist, Dr. Annabell Howells.  Further management as condition evolves.     Zannie Cove, MD     PJ/MEDQ  D:  01/04/2011  T:  01/04/2011  Job:  [id number]  cc:   Excell Seltzer. Annabell Howells, M.D. Teola Bradley, M.D.  Electronically Signed by Zannie Cove  on 01/15/2011 08:06:55 PM

## 2011-02-02 ENCOUNTER — Other Ambulatory Visit: Payer: Self-pay | Admitting: Cardiology

## 2011-02-14 ENCOUNTER — Other Ambulatory Visit: Payer: Self-pay | Admitting: Cardiology

## 2011-02-23 ENCOUNTER — Other Ambulatory Visit: Payer: Self-pay | Admitting: Cardiology

## 2011-02-27 ENCOUNTER — Other Ambulatory Visit: Payer: Self-pay | Admitting: *Deleted

## 2011-02-27 MED ORDER — ATORVASTATIN CALCIUM 80 MG PO TABS: 80.0000 mg | ORAL_TABLET | Freq: Every day | ORAL | Status: DC

## 2011-03-04 ENCOUNTER — Other Ambulatory Visit: Payer: Self-pay | Admitting: Cardiology

## 2011-03-26 ENCOUNTER — Other Ambulatory Visit: Payer: Self-pay | Admitting: *Deleted

## 2011-03-26 MED ORDER — ISOSORBIDE DINITRATE 20 MG PO TABS: 20.0000 mg | ORAL_TABLET | Freq: Two times a day (BID) | ORAL | Status: DC

## 2011-04-02 ENCOUNTER — Other Ambulatory Visit: Payer: Self-pay | Admitting: *Deleted

## 2011-04-02 MED ORDER — ATORVASTATIN CALCIUM 80 MG PO TABS: 80.0000 mg | ORAL_TABLET | Freq: Every day | ORAL | Status: DC

## 2011-04-25 ENCOUNTER — Encounter: Payer: Self-pay | Admitting: Cardiology

## 2011-04-26 ENCOUNTER — Encounter: Payer: Self-pay | Admitting: Cardiology

## 2011-04-26 ENCOUNTER — Ambulatory Visit (INDEPENDENT_AMBULATORY_CARE_PROVIDER_SITE_OTHER): Payer: Medicare Other | Admitting: Cardiology

## 2011-04-26 DIAGNOSIS — I2510 Atherosclerotic heart disease of native coronary artery without angina pectoris: Secondary | ICD-10-CM

## 2011-04-26 DIAGNOSIS — E7800 Pure hypercholesterolemia, unspecified: Secondary | ICD-10-CM

## 2011-04-26 DIAGNOSIS — R9430 Abnormal result of cardiovascular function study, unspecified: Secondary | ICD-10-CM

## 2011-04-26 NOTE — Patient Instructions (Addendum)
Continue all current medications. Your physician wants you to follow up in:  1 year.  You will receive a reminder letter in the mail one-two months in advance.  If you don't receive a letter, please call our office to schedule the follow up appointment   

## 2011-04-29 ENCOUNTER — Other Ambulatory Visit: Payer: Self-pay | Admitting: Cardiology

## 2011-04-29 DIAGNOSIS — R9430 Abnormal result of cardiovascular function study, unspecified: Secondary | ICD-10-CM | POA: Insufficient documentation

## 2011-04-29 NOTE — Assessment & Plan Note (Signed)
Continue risk factor modification including diet, therapeutic lifestyle changes and management of dyslipidemia per his primary care physician.

## 2011-04-29 NOTE — Assessment & Plan Note (Signed)
No definite diagnosis of coronary artery disease. The patient never had a cardiac catheterization and always has declined this

## 2011-04-29 NOTE — Progress Notes (Signed)
HPI The patient is a 77 year old male with multiple cardiac risk factors. In 2008 the patient an abnormal stress Cardiolite study did refuse cardiac catheterization on multiple occasions. The patient continues to decline a cardiac catheterization. Fortunately he has never smoked. He also reports no chest pain. He does have chronic dyspnea on exertion which could in large part be due to significant obesity. The patient did lose about 10 pounds since last year but still weighing 296 pounds.  We did a bedside echocardiogram today. The patient his ejection fraction is entirely within normal limits actually around 60-65%. There are no gross valvular abnormalities and interestingly his right heart structures are also within normal limits.  No Known Allergies  Current Outpatient Prescriptions on File Prior to Visit  Medication Sig Dispense Refill  . amLODipine (NORVASC) 5 MG tablet TAKE ONE TABLET BY MOUTH ONE TIME DAILY  30 tablet  1  . atorvastatin (LIPITOR) 80 MG tablet Take 1 tablet (80 mg total) by mouth daily.  30 tablet  0  . isosorbide dinitrate (ISORDIL) 20 MG tablet Take 1 tablet (20 mg total) by mouth 2 (two) times daily.  60 tablet  0  . metoprolol (LOPRESSOR) 50 MG tablet TAKE ONE TABLET BY MOUTH TWICE DAILY  60 tablet  1    Past Medical History  Diagnosis Date  . Coronary atherosclerosis of native coronary artery   . Pure hypercholesterolemia   . Nonspecific abnormal unspecified cardiovascular function study   . Other and unspecified hyperlipidemia     No past surgical history on file.  No family history on file.  History   Social History  . Marital Status: Married    Spouse Name: [name]    Number of Children: N/A  . Years of Education: N/A   Occupational History  . RETIRED    Social History Main Topics  . Smoking status: Never Smoker   . Smokeless tobacco: Never Used  . Alcohol Use: Not on file  . Drug Use: Not on file  . Sexually Active: Not on file   Other Topics  Concern  . Not on file   Social History Narrative  . No narrative on file   [id number] positives as outlined above. The remainder of the 18  point review of systems is negative  PHYSICAL EXAM BP 149/83  Pulse 66  Ht 5\' 11"  (1.803 m)  Wt 296 lb (134.265 kg)  BMI 41.28 kg/m2  General: Well-developed, well-nourished in no distress Head: Normocephalic and atraumatic Eyes:PERRLA/EOMI intact, conjunctiva and lids normal Ears: No deformity or lesions Mouth:normal dentition, normal posterior pharynx Neck: Supple, no JVD.  No masses, thyromegaly or abnormal cervical nodes Lungs: Normal breath sounds bilaterally without wheezing.  Normal percussion Cardiac: regular rate and rhythm with normal S1 and S2, no S3 or S4.  PMI is normal.  No pathological murmurs Abdomen: Obesity, Normal bowel sounds, abdomen is soft and nontender without masses, organomegaly or hernias noted.  No hepatosplenomegaly, midline hernia MSK: Back normal, normal gait muscle strength and tone normal Vascular: Pulse is normal in all 4 extremities Extremities: No peripheral pitting edema Neurologic: Alert and oriented x 3 Skin: Intact without lesions or rashes Lymphatics: No significant adenopathy Psychologic: Normal affect    ECG:NA  ASSESSMENT AND PLAN

## 2011-04-29 NOTE — Assessment & Plan Note (Signed)
Abnormal Cardiolite study: Patient declined cardiac catheterization ejection fraction within normal limits with no wall motion abnormalities and no chest pain. Patient is medically catheterization for many years. His ejection fraction however remains normal. I suspect a large part is dyspnea is secondary to deconditioning and obesity. Fortunately the patient does not smoke.

## 2011-05-28 ENCOUNTER — Other Ambulatory Visit: Payer: Self-pay | Admitting: Cardiology

## 2011-08-07 ENCOUNTER — Telehealth: Payer: Self-pay | Admitting: Oncology

## 2011-08-07 NOTE — Telephone Encounter (Signed)
Put patient's [employer] disability papers on nurse's desk

## 2011-08-22 ENCOUNTER — Encounter (HOSPITAL_BASED_OUTPATIENT_CLINIC_OR_DEPARTMENT_OTHER): Payer: Self-pay | Admitting: Student

## 2011-08-22 ENCOUNTER — Emergency Department (HOSPITAL_BASED_OUTPATIENT_CLINIC_OR_DEPARTMENT_OTHER)
Admission: EM | Admit: 2011-08-22 | Discharge: 2011-08-22 | Disposition: A | Discharge status: Home / Self Care | Payer: Medicare Other | Attending: Emergency Medicine | Admitting: Emergency Medicine

## 2011-08-22 DIAGNOSIS — E7800 Pure hypercholesterolemia, unspecified: Secondary | ICD-10-CM | POA: Insufficient documentation

## 2011-08-22 DIAGNOSIS — I2510 Atherosclerotic heart disease of native coronary artery without angina pectoris: Secondary | ICD-10-CM | POA: Insufficient documentation

## 2011-08-22 DIAGNOSIS — J111 Influenza due to unidentified influenza virus with other respiratory manifestations: Secondary | ICD-10-CM | POA: Insufficient documentation

## 2011-08-22 DIAGNOSIS — Z79899 Other long term (current) drug therapy: Secondary | ICD-10-CM | POA: Insufficient documentation

## 2011-08-22 DIAGNOSIS — I10 Essential (primary) hypertension: Secondary | ICD-10-CM | POA: Insufficient documentation

## 2011-08-22 HISTORY — DX: Essential (primary) hypertension: I10

## 2011-08-22 MED ORDER — OSELTAMIVIR PHOSPHATE 75 MG PO CAPS: 75.0000 mg | ORAL_CAPSULE | Freq: Two times a day (BID) | ORAL | Status: AC

## 2011-08-22 NOTE — ED Provider Notes (Addendum)
History     CSN: [id number]  Arrival date & time 08/22/11  7829   First MD Initiated Contact with Patient 08/22/11 1023      Chief Complaint  Patient presents with  . Influenza    (Consider location/radiation/quality/duration/timing/severity/associated sxs/prior treatment) The history is provided by the patient.   patient is 77 year old male with acute onset of flulike symptoms to include mild headache fever cough congestion cough is dry nonproductive mild sore throat no significant body aches. No nausea vomiting or diarrhea. Patient did not have flu shot this year.  Past Medical History  Diagnosis Date  . Coronary atherosclerosis of native coronary artery   . Pure hypercholesterolemia   . Nonspecific abnormal unspecified cardiovascular function study   . Other and unspecified hyperlipidemia   . Hypertension   . Gout     History reviewed. No pertinent past surgical history.  History reviewed. No pertinent family history.  History  Substance Use Topics  . Smoking status: Never Smoker   . Smokeless tobacco: Never Used  . Alcohol Use: Not on file      Review of Systems  Constitutional: Positive for fever.  HENT: Positive for congestion. Negative for neck pain.   Eyes: Negative for visual disturbance.  Respiratory: Positive for cough. Negative for shortness of breath.   Cardiovascular: Negative for chest pain.  Gastrointestinal: Negative for nausea, vomiting, abdominal pain and diarrhea.  Genitourinary: Negative for dysuria and hematuria.  Musculoskeletal: Negative for back pain.  Skin: Negative for rash.  Neurological: Positive for headaches.  Psychiatric/Behavioral: Negative for confusion.    Allergies  Review of patient's allergies indicates no known allergies.  Home Medications   Current Outpatient Rx  Name Route Sig Dispense Refill  . ALLOPURINOL 300 MG PO TABS Oral Take 300 mg by mouth daily.      Marland Kitchen AMLODIPINE BESYLATE 5 MG PO TABS  TAKE ONE TABLET BY  MOUTH ONE TIME DAILY 30 tablet 6  . ASPIRIN 81 MG PO TABS Oral Take 81 mg by mouth daily.      . ATORVASTATIN CALCIUM 80 MG PO TABS  TAKE ONE TABLET BY MOUTH ONE TIME DAILY 30 tablet 6  . DOXAZOSIN MESYLATE 8 MG PO TABS Oral Take 4-8 mg by mouth daily.     . OMEGA-3 FATTY ACIDS 1000 MG PO CAPS Oral Take 2 capsules by mouth 2 (two) times daily.      . ISOSORBIDE DINITRATE 20 MG PO TABS  TAKE ONE TABLET BY MOUTH TWICE DAILY 60 tablet 6  . METOPROLOL TARTRATE 50 MG PO TABS  TAKE ONE TABLET BY MOUTH TWICE DAILY 60 tablet 6  . OSELTAMIVIR PHOSPHATE 75 MG PO CAPS Oral Take 1 capsule (75 mg total) by mouth every 12 (twelve) hours. 10 capsule 0    BP 194/77  Pulse 72  Temp(Src) 98.1 F (36.7 C) (Oral)  Resp 16  Ht 5\' 11"  (1.803 m)  Wt 290 lb (131.543 kg)  BMI 40.45 kg/m2  SpO2 94%  Physical Exam  Nursing note and vitals reviewed. Constitutional: He is oriented to person, place, and time. He appears well-developed and well-nourished. No distress.  HENT:  Head: Normocephalic and atraumatic.  Mouth/Throat: Oropharynx is clear and moist.  Eyes: Conjunctivae and EOM are normal. Pupils are equal, round, and reactive to light.  Neck: Normal range of motion. Neck supple.  Cardiovascular: Normal rate, regular rhythm and normal heart sounds.   No murmur heard. Pulmonary/Chest: Effort normal and breath sounds normal.  Abdominal: Soft. Bowel  sounds are normal. There is no tenderness.  Musculoskeletal: Normal range of motion.  Lymphadenopathy:    He has no cervical adenopathy.  Neurological: He is oriented to person, place, and time. No cranial nerve deficit. Coordination normal.  Skin: Skin is warm. No rash noted. He is not diaphoretic.    ED Course  Procedures (including critical care time)  Labs Reviewed - No data to display No results found.   1. Influenza       MDM   Symptoms consistent with influenza no acute distress nontoxic, Since symptoms just started yesterday  Tamiflu may  be of benefit. Other treatment will be symptomatic, rest increase fluids over-the-counter Robitussin-DM for cough and/or Theraflu.         Shelda Jakes, MD 08/22/11 1203  Shelda Jakes, MD 08/22/11 [phone]

## 2011-08-22 NOTE — ED Notes (Signed)
Cold s/sx flu like s/sx

## 2011-08-23 ENCOUNTER — Encounter (HOSPITAL_BASED_OUTPATIENT_CLINIC_OR_DEPARTMENT_OTHER): Payer: Self-pay

## 2011-08-23 ENCOUNTER — Emergency Department (INDEPENDENT_AMBULATORY_CARE_PROVIDER_SITE_OTHER): Payer: Medicare Other

## 2011-08-23 ENCOUNTER — Emergency Department (HOSPITAL_BASED_OUTPATIENT_CLINIC_OR_DEPARTMENT_OTHER)
Admission: EM | Admit: 2011-08-23 | Discharge: 2011-08-23 | Disposition: A | Discharge status: Home / Self Care | Payer: Medicare Other | Attending: Emergency Medicine | Admitting: Emergency Medicine

## 2011-08-23 ENCOUNTER — Other Ambulatory Visit: Payer: Self-pay

## 2011-08-23 DIAGNOSIS — R51 Headache: Secondary | ICD-10-CM

## 2011-08-23 DIAGNOSIS — E7800 Pure hypercholesterolemia, unspecified: Secondary | ICD-10-CM | POA: Insufficient documentation

## 2011-08-23 DIAGNOSIS — R55 Syncope and collapse: Secondary | ICD-10-CM

## 2011-08-23 DIAGNOSIS — R42 Dizziness and giddiness: Secondary | ICD-10-CM

## 2011-08-23 DIAGNOSIS — I10 Essential (primary) hypertension: Secondary | ICD-10-CM | POA: Insufficient documentation

## 2011-08-23 DIAGNOSIS — E785 Hyperlipidemia, unspecified: Secondary | ICD-10-CM | POA: Insufficient documentation

## 2011-08-23 LAB — DIFFERENTIAL
Basophils Absolute: 0 10*3/uL (ref 0.0–0.1)
Basophils Relative: 0 % (ref 0–1)
Eosinophils Absolute: 0.1 10*3/uL (ref 0.0–0.7)
Eosinophils Relative: 3 % (ref 0–5)
Monocytes Absolute: 0.8 10*3/uL (ref 0.1–1.0)
Monocytes Relative: 19 % — ABNORMAL HIGH (ref 3–12)
Neutro Abs: 2.1 10*3/uL (ref 1.7–7.7)

## 2011-08-23 LAB — CBC
HCT: 39.1 % (ref 39.0–52.0)
MCH: 29.4 pg (ref 26.0–34.0)
MCHC: 32.7 g/dL (ref 30.0–36.0)
MCV: 89.7 fL (ref 78.0–100.0)
RDW: 13.6 % (ref 11.5–15.5)

## 2011-08-23 LAB — COMPREHENSIVE METABOLIC PANEL
AST: 19 U/L (ref 0–37)
Albumin: 3.3 g/dL — ABNORMAL LOW (ref 3.5–5.2)
BUN: 12 mg/dL (ref 6–23)
Calcium: 8.9 mg/dL (ref 8.4–10.5)
Creatinine, Ser: 1 mg/dL (ref 0.50–1.35)
Total Protein: 6.8 g/dL (ref 6.0–8.3)

## 2011-08-23 MED ORDER — SODIUM CHLORIDE 0.9 % IV SOLN
Freq: Once | INTRAVENOUS | Status: AC
  Administered 2011-08-23: 14:00:00 via INTRAVENOUS

## 2011-08-23 NOTE — ED Notes (Signed)
Secondary Assessment-  Pt reports he was seen in the ED yesterday, d/c'd home and has been taking PO meds as prescribed.  He took "the DM cough medicine" last pm and went to bed.  States he took "DM" again at 0930 this morning, took a nap and awakened confused and "flailing arms".  Wife reports he was ambulatory to living room, confused and she assisted him to the chair.  She states episode lasted a few minutes and "he just didn't act right".  Denies incontinence and no injuries occurred.  Pt reports a headache earlier today but denies at present time.

## 2011-08-23 NOTE — ED Provider Notes (Signed)
History     CSN: [id number]  Arrival date & time 08/23/11  1206   First MD Initiated Contact with Patient 08/23/11 1343      No chief complaint on file.   (Consider location/radiation/quality/duration/timing/severity/associated sxs/prior treatment) HPI Comments: Was seen yesterday for "flu-like symptoms".  Was given tamiflu and discharged.  This morning patient took meds and went back to sleep.  Woke up then became dizzy and felt like he was going to pass out.  Had a headache and felt disoriented.  Now feels back to normal.    The history is provided by the patient.    Past Medical History  Diagnosis Date  . Coronary atherosclerosis of native coronary artery   . Pure hypercholesterolemia   . Nonspecific abnormal unspecified cardiovascular function study   . Other and unspecified hyperlipidemia   . Hypertension   . Gout     History reviewed. No pertinent past surgical history.  No family history on file.  History  Substance Use Topics  . Smoking status: Never Smoker   . Smokeless tobacco: Never Used  . Alcohol Use: Not on file      Review of Systems  All other systems reviewed and are negative.    Allergies  Review of patient's allergies indicates no known allergies.  Home Medications   Current Outpatient Rx  Name Route Sig Dispense Refill  . ROBITUSSIN DM PO Oral Take by mouth.      . ALLOPURINOL 300 MG PO TABS Oral Take 300 mg by mouth daily.      Marland Kitchen AMLODIPINE BESYLATE 5 MG PO TABS  TAKE ONE TABLET BY MOUTH ONE TIME DAILY 30 tablet 6  . ASPIRIN 81 MG PO TABS Oral Take 81 mg by mouth daily.      . ATORVASTATIN CALCIUM 80 MG PO TABS  TAKE ONE TABLET BY MOUTH ONE TIME DAILY 30 tablet 6  . DOXAZOSIN MESYLATE 8 MG PO TABS Oral Take 4-8 mg by mouth daily.     . OMEGA-3 FATTY ACIDS 1000 MG PO CAPS Oral Take 2 capsules by mouth 2 (two) times daily.      . ISOSORBIDE DINITRATE 20 MG PO TABS  TAKE ONE TABLET BY MOUTH TWICE DAILY 60 tablet 6  . METOPROLOL TARTRATE  50 MG PO TABS  TAKE ONE TABLET BY MOUTH TWICE DAILY 60 tablet 6  . OSELTAMIVIR PHOSPHATE 75 MG PO CAPS Oral Take 1 capsule (75 mg total) by mouth every 12 (twelve) hours. 10 capsule 0    BP 123/63  Pulse 67  Temp(Src) 99.3 F (37.4 C) (Oral)  Resp 21  Ht 5\' 11"  (1.803 m)  Wt 290 lb (131.543 kg)  BMI 40.45 kg/m2  SpO2 93%  Physical Exam  Constitutional: He is oriented to person, place, and time. He appears well-developed and well-nourished. No distress.  HENT:  Head: Normocephalic and atraumatic.  Mouth/Throat: Oropharynx is clear and moist.  Eyes: EOM are normal. Pupils are equal, round, and reactive to light.  Neck: Normal range of motion. Neck supple.  Cardiovascular: Normal rate and regular rhythm.   No murmur heard. Pulmonary/Chest: Effort normal and breath sounds normal. No respiratory distress.  Abdominal: Soft. Bowel sounds are normal. He exhibits no distension.  Musculoskeletal: Normal range of motion. He exhibits no edema.  Neurological: He is alert and oriented to person, place, and time.  Skin: Skin is warm and dry. He is not diaphoretic.    ED Course  Procedures (including critical care time)  Labs Reviewed  CBC  DIFFERENTIAL  COMPREHENSIVE METABOLIC PANEL   No results found.   No diagnosis found.    MDM  Labs, EKG, Head CT all look okay.  Patient feels fine now.  I am unsure of cause of patient's symptoms, but sounds vasovagal.  Will discharge to home.          Geoffery Lyons, MD 08/23/11 [phone]

## 2011-08-23 NOTE — ED Notes (Signed)
Wife states pt woke up today was shaking, felt dizzy and almost fell out pf chair-pt states she took all of his meds-took a nap-felt "skaky, couldn't hardly stand and my left eye hurts"-pt is A/O at this time

## 2011-08-23 NOTE — ED Notes (Signed)
MD at bedside. 

## 2011-11-15 ENCOUNTER — Inpatient Hospital Stay (HOSPITAL_COMMUNITY)
Admission: EM | Admit: 2011-11-15 | Discharge: 2011-11-30 | DRG: 234 | Disposition: A | Discharge status: Home Health | Payer: Medicare Other | Source: Ambulatory Visit | Attending: Cardiothoracic Surgery | Admitting: Cardiothoracic Surgery

## 2011-11-15 ENCOUNTER — Emergency Department (HOSPITAL_COMMUNITY): Payer: Medicare Other

## 2011-11-15 ENCOUNTER — Encounter (HOSPITAL_COMMUNITY): Payer: Self-pay | Admitting: Emergency Medicine

## 2011-11-15 ENCOUNTER — Other Ambulatory Visit: Payer: Self-pay

## 2011-11-15 DIAGNOSIS — M109 Gout, unspecified: Secondary | ICD-10-CM | POA: Diagnosis present

## 2011-11-15 DIAGNOSIS — R5381 Other malaise: Secondary | ICD-10-CM | POA: Diagnosis present

## 2011-11-15 DIAGNOSIS — D62 Acute posthemorrhagic anemia: Secondary | ICD-10-CM | POA: Diagnosis not present

## 2011-11-15 DIAGNOSIS — E7800 Pure hypercholesterolemia, unspecified: Secondary | ICD-10-CM | POA: Diagnosis present

## 2011-11-15 DIAGNOSIS — I4891 Unspecified atrial fibrillation: Secondary | ICD-10-CM | POA: Diagnosis present

## 2011-11-15 DIAGNOSIS — Z6837 Body mass index (BMI) 37.0-37.9, adult: Secondary | ICD-10-CM

## 2011-11-15 DIAGNOSIS — R0989 Other specified symptoms and signs involving the circulatory and respiratory systems: Secondary | ICD-10-CM | POA: Diagnosis not present

## 2011-11-15 DIAGNOSIS — Z79899 Other long term (current) drug therapy: Secondary | ICD-10-CM

## 2011-11-15 DIAGNOSIS — R079 Chest pain, unspecified: Secondary | ICD-10-CM

## 2011-11-15 DIAGNOSIS — I480 Paroxysmal atrial fibrillation: Secondary | ICD-10-CM

## 2011-11-15 DIAGNOSIS — I214 Non-ST elevation (NSTEMI) myocardial infarction: Principal | ICD-10-CM | POA: Diagnosis present

## 2011-11-15 DIAGNOSIS — I200 Unstable angina: Secondary | ICD-10-CM

## 2011-11-15 DIAGNOSIS — J90 Pleural effusion, not elsewhere classified: Secondary | ICD-10-CM | POA: Diagnosis not present

## 2011-11-15 DIAGNOSIS — N400 Enlarged prostate without lower urinary tract symptoms: Secondary | ICD-10-CM | POA: Diagnosis present

## 2011-11-15 DIAGNOSIS — I2510 Atherosclerotic heart disease of native coronary artery without angina pectoris: Secondary | ICD-10-CM | POA: Diagnosis present

## 2011-11-15 DIAGNOSIS — M129 Arthropathy, unspecified: Secondary | ICD-10-CM | POA: Diagnosis present

## 2011-11-15 DIAGNOSIS — E119 Type 2 diabetes mellitus without complications: Secondary | ICD-10-CM | POA: Diagnosis present

## 2011-11-15 DIAGNOSIS — Z9981 Dependence on supplemental oxygen: Secondary | ICD-10-CM

## 2011-11-15 DIAGNOSIS — I10 Essential (primary) hypertension: Secondary | ICD-10-CM

## 2011-11-15 DIAGNOSIS — M7989 Other specified soft tissue disorders: Secondary | ICD-10-CM | POA: Diagnosis present

## 2011-11-15 DIAGNOSIS — E669 Obesity, unspecified: Secondary | ICD-10-CM | POA: Diagnosis present

## 2011-11-15 DIAGNOSIS — N189 Chronic kidney disease, unspecified: Secondary | ICD-10-CM | POA: Diagnosis present

## 2011-11-15 DIAGNOSIS — E785 Hyperlipidemia, unspecified: Secondary | ICD-10-CM | POA: Diagnosis present

## 2011-11-15 DIAGNOSIS — N289 Disorder of kidney and ureter, unspecified: Secondary | ICD-10-CM | POA: Diagnosis not present

## 2011-11-15 DIAGNOSIS — R0609 Other forms of dyspnea: Secondary | ICD-10-CM | POA: Diagnosis not present

## 2011-11-15 DIAGNOSIS — Z7982 Long term (current) use of aspirin: Secondary | ICD-10-CM

## 2011-11-15 DIAGNOSIS — I129 Hypertensive chronic kidney disease with stage 1 through stage 4 chronic kidney disease, or unspecified chronic kidney disease: Secondary | ICD-10-CM | POA: Diagnosis present

## 2011-11-15 HISTORY — DX: Acute myocardial infarction, unspecified: I21.9

## 2011-11-15 HISTORY — DX: Chronic kidney disease, unspecified: N18.9

## 2011-11-15 HISTORY — DX: Unspecified osteoarthritis, unspecified site: M19.90

## 2011-11-15 HISTORY — DX: Angina pectoris, unspecified: I20.9

## 2011-11-15 LAB — CARDIAC PANEL(CRET KIN+CKTOT+MB+TROPI)
CK, MB: 3 ng/mL (ref 0.3–4.0)
CK, MB: 4.5 ng/mL — ABNORMAL HIGH (ref 0.3–4.0)
CK, MB: 3.4 ng/mL (ref 0.3–4.0)
Relative Index: 3 — ABNORMAL HIGH (ref 0.0–2.5)
Relative Index: 4.5 — ABNORMAL HIGH (ref 0.0–2.5)
Relative Index: INVALID (ref 0.0–2.5)
Total CK: 100 U/L (ref 7–232)
Total CK: 100 U/L (ref 7–232)
Total CK: 96 U/L (ref 7–232)
Troponin I: <0.3 ng/mL (ref <0.30)
Troponin I: 1.08 ng/mL (ref <0.30)

## 2011-11-15 LAB — COMPREHENSIVE METABOLIC PANEL
ALT: 11 U/L (ref 0–53)
Albumin: 3.4 g/dL — ABNORMAL LOW (ref 3.5–5.2)
BUN: 11 mg/dL (ref 6–23)
Calcium: 9.1 mg/dL (ref 8.4–10.5)
GFR calc Af Amer: >90 mL/min (ref >90)
Glucose, Bld: 95 mg/dL (ref 70–99)
Sodium: 139 mEq/L (ref 135–145)
Total Protein: 6.9 g/dL (ref 6.0–8.3)

## 2011-11-15 LAB — DIFFERENTIAL
Lymphocytes Relative: 23 % (ref 12–46)
Monocytes Absolute: 0.6 10*3/uL (ref 0.1–1.0)
Monocytes Relative: 11 % (ref 3–12)
Neutro Abs: 3.4 10*3/uL (ref 1.7–7.7)

## 2011-11-15 LAB — CBC
HCT: 39.7 % (ref 39.0–52.0)
Hemoglobin: 13.9 g/dL (ref 13.0–17.0)
MCV: 88.8 fL (ref 78.0–100.0)
WBC: 5.3 10*3/uL (ref 4.0–10.5)

## 2011-11-15 LAB — D-DIMER, QUANTITATIVE: D-Dimer, Quant: 1.16 ug/mL-FEU — ABNORMAL HIGH (ref 0.00–0.48)

## 2011-11-15 LAB — POCT I-STAT TROPONIN I: Troponin i, poc: 0.07 ng/mL (ref 0.00–0.08)

## 2011-11-15 MED ORDER — SODIUM CHLORIDE 0.9 % IJ SOLN
3.0000 mL | Freq: Two times a day (BID) | INTRAMUSCULAR | Status: DC
  Administered 2011-11-15 – 2011-11-19 (×9): 3 mL via INTRAVENOUS

## 2011-11-15 MED ORDER — SODIUM CHLORIDE 0.9 % IJ SOLN: 3.0000 mL | INTRAMUSCULAR | Status: DC | PRN

## 2011-11-15 MED ORDER — ASPIRIN EC 81 MG PO TBEC
81.0000 mg | DELAYED_RELEASE_TABLET | Freq: Every day | ORAL | Status: DC
  Administered 2011-11-15: 81 mg via ORAL
  Filled 2011-11-15 (×2): qty 1

## 2011-11-15 MED ORDER — SODIUM CHLORIDE 0.9 % IJ SOLN
3.0000 mL | Freq: Two times a day (BID) | INTRAMUSCULAR | Status: DC
  Administered 2011-11-15: 3 mL via INTRAVENOUS

## 2011-11-15 MED ORDER — ZOLPIDEM TARTRATE 5 MG PO TABS
5.0000 mg | ORAL_TABLET | Freq: Every evening | ORAL | Status: DC | PRN
  Administered 2011-11-19: 5 mg via ORAL
  Filled 2011-11-15: qty 1

## 2011-11-15 MED ORDER — METOPROLOL TARTRATE 50 MG PO TABS
50.0000 mg | ORAL_TABLET | Freq: Two times a day (BID) | ORAL | Status: DC
  Administered 2011-11-15 – 2011-11-19 (×9): 50 mg via ORAL
  Filled 2011-11-15 (×11): qty 1

## 2011-11-15 MED ORDER — ASPIRIN 81 MG PO CHEW
324.0000 mg | CHEWABLE_TABLET | ORAL | Status: AC
  Administered 2011-11-16: 324 mg via ORAL
  Filled 2011-11-15: qty 4

## 2011-11-15 MED ORDER — DOXAZOSIN MESYLATE 8 MG PO TABS
8.0000 mg | ORAL_TABLET | Freq: Every day | ORAL | Status: DC
  Administered 2011-11-15 – 2011-11-19 (×5): 8 mg via ORAL
  Filled 2011-11-15 (×6): qty 1

## 2011-11-15 MED ORDER — HEPARIN (PORCINE) IN NACL 100-0.45 UNIT/ML-% IJ SOLN
1550.0000 [IU]/h | INTRAMUSCULAR | Status: DC
  Administered 2011-11-15 – 2011-11-16 (×2): 1650 [IU]/h via INTRAVENOUS
  Administered 2011-11-16: 1550 [IU]/h via INTRAVENOUS
  Filled 2011-11-15 (×3): qty 250

## 2011-11-15 MED ORDER — HEPARIN BOLUS VIA INFUSION
5000.0000 [IU] | Freq: Once | INTRAVENOUS | Status: AC
  Administered 2011-11-15: 5000 [IU] via INTRAVENOUS

## 2011-11-15 MED ORDER — SODIUM CHLORIDE 0.9 % IV SOLN
250.0000 mL | INTRAVENOUS | Status: DC | PRN
  Administered 2011-11-18: 1000 mL via INTRAVENOUS

## 2011-11-15 MED ORDER — DIAZEPAM 5 MG PO TABS
5.0000 mg | ORAL_TABLET | ORAL | Status: AC
  Administered 2011-11-16: 5 mg via ORAL
  Filled 2011-11-15: qty 1

## 2011-11-15 MED ORDER — ONDANSETRON HCL 4 MG/2ML IJ SOLN: 4.0000 mg | Freq: Four times a day (QID) | INTRAMUSCULAR | Status: DC | PRN

## 2011-11-15 MED ORDER — ALLOPURINOL 300 MG PO TABS
300.0000 mg | ORAL_TABLET | Freq: Every day | ORAL | Status: DC
  Administered 2011-11-15 – 2011-11-19 (×5): 300 mg via ORAL
  Filled 2011-11-15 (×6): qty 1

## 2011-11-15 MED ORDER — OMEGA-3 FATTY ACIDS 1000 MG PO CAPS: 2.0000 | ORAL_CAPSULE | Freq: Two times a day (BID) | ORAL | Status: DC

## 2011-11-15 MED ORDER — OMEGA-3-ACID ETHYL ESTERS 1 G PO CAPS
2.0000 g | ORAL_CAPSULE | Freq: Two times a day (BID) | ORAL | Status: DC
  Administered 2011-11-15 – 2011-11-18 (×7): 2 g via ORAL
  Filled 2011-11-15 (×9): qty 2

## 2011-11-15 MED ORDER — DILTIAZEM HCL 100 MG IV SOLR: 5.0000 mg/h | INTRAVENOUS | Status: DC

## 2011-11-15 MED ORDER — ISOSORBIDE DINITRATE 20 MG PO TABS
20.0000 mg | ORAL_TABLET | Freq: Two times a day (BID) | ORAL | Status: DC
  Administered 2011-11-15 – 2011-11-19 (×9): 20 mg via ORAL
  Filled 2011-11-15 (×11): qty 1

## 2011-11-15 MED ORDER — SODIUM CHLORIDE 0.9 % IV SOLN: 250.0000 mL | INTRAVENOUS | Status: DC | PRN

## 2011-11-15 MED ORDER — ATORVASTATIN CALCIUM 80 MG PO TABS
80.0000 mg | ORAL_TABLET | Freq: Every day | ORAL | Status: DC
  Administered 2011-11-15 – 2011-11-18 (×4): 80 mg via ORAL
  Filled 2011-11-15 (×5): qty 1

## 2011-11-15 MED ORDER — DILTIAZEM HCL 50 MG/10ML IV SOLN
25.0000 mg | Freq: Once | INTRAVENOUS | Status: AC
  Administered 2011-11-15: 25 mg via INTRAVENOUS
  Filled 2011-11-15 (×2): qty 5

## 2011-11-15 MED ORDER — ASPIRIN 81 MG PO TABS: 81.0000 mg | ORAL_TABLET | Freq: Every day | ORAL | Status: DC

## 2011-11-15 MED ORDER — ALPRAZOLAM 0.25 MG PO TABS: 0.2500 mg | ORAL_TABLET | Freq: Two times a day (BID) | ORAL | Status: DC | PRN

## 2011-11-15 MED ORDER — NITROGLYCERIN 0.4 MG SL SUBL: 0.4000 mg | SUBLINGUAL_TABLET | SUBLINGUAL | Status: DC | PRN

## 2011-11-15 MED ORDER — SODIUM CHLORIDE 0.9 % IV SOLN
INTRAVENOUS | Status: DC
  Administered 2011-11-15: 23:00:00 via INTRAVENOUS

## 2011-11-15 MED ORDER — DILTIAZEM HCL 100 MG IV SOLR
5.0000 mg/h | INTRAVENOUS | Status: DC
  Administered 2011-11-15 – 2011-11-20 (×7): 5 mg/h via INTRAVENOUS
  Filled 2011-11-15 (×9): qty 100

## 2011-11-15 MED ORDER — ACETAMINOPHEN 325 MG PO TABS: 650.0000 mg | ORAL_TABLET | ORAL | Status: DC | PRN

## 2011-11-15 NOTE — ED Notes (Signed)
Pt states he has been having chest pain off and on for the past three days.  States it comes and goes, states it feels like indigestion and discomfort.

## 2011-11-15 NOTE — ED Notes (Signed)
Pharmacist verified cardizem and heparin are IV compatible

## 2011-11-15 NOTE — Progress Notes (Signed)
ANTICOAGULATION CONSULT NOTE - Follow Up  Pharmacy Consult for  Heparin Indication:  Atrial Fib/RVR and Chest Pain  No Known Allergies  Patient Measurements: Height: 5\' 11"  (180.3 cm) Weight: 290 lb 9.1 oz (131.8 kg) IBW/kg (Calculated) : 75.3  Heparin Dosing Weight: 92 kg  Vital Signs: Temp: 99.4 F (37.4 C) (03/21 2212) Temp src: Oral (03/21 1627) BP: 160/83 mmHg (03/21 2300) Pulse Rate: 80  (03/21 2300)  Labs:  Basename 11/15/11 2228 11/15/11 1720 11/15/11 0909 11/15/11 0856  HGB -- -- -- 13.9  HCT -- -- -- 39.7  PLT -- -- -- 185  APTT -- -- -- --  LABPROT -- -- -- --  INR -- -- -- --  HEPARINUNFRC 0.64 -- -- --  CREATININE -- -- -- 0.83  CKTOTAL 96 100 100 --  CKMB 3.4 4.5* 3.0 --  TROPONINI 0.97* 1.08* <0.30 --   Estimated Creatinine Clearance: 103.2 ml/min (by C-G formula based on Cr of 0.83).  Medical History: Past Medical History  Diagnosis Date  . Coronary atherosclerosis of native coronary artery   . Pure hypercholesterolemia   . Nonspecific abnormal unspecified cardiovascular function study   . Other and unspecified hyperlipidemia   . Hypertension   . Gout   . Angina   . Myocardial infarction   . Dysrhythmia     atrial fibrilation  . Shortness of breath   . Chronic kidney disease     hx of BPH  . Arthritis     Medications:  Allopurinol 300 mg daily Amlodipine 5 mg daily Aspirin 81 mg daily Doxazosin 8 mg daily Fish Oil-Omega-3 fatty acids 2000 mg twice daily Isordil 20 mg twice daily Metoprolol 50 mg twice daily  Assessment: 78 yo male admitted with complaints of chest pain and found to have atrial fibrillation with RVR.  Heparin level (0.64) is at-goal on 1650 units/hr.   Goal of Therapy:  Heparin level 0.3-0.7 units/ml   Plan:  1. Continue IV heparin at 1650 units/hr.  2. Follow-up AM labs.   Lorre Munroe, PharmD 11/15/2011,11:36 PM

## 2011-11-15 NOTE — ED Notes (Signed)
Pharmacy called again requesting cardizem

## 2011-11-15 NOTE — H&P (Signed)
History and Physical   Patient ID: Marcus [name] MRN: [id number], DOB/AGE: 03-12-1933   Admit date: 11/15/2011 Date of Consult: 11/15/2011   Primary Physician: No primary provider on file. Primary Cardiologist: Marcus [name], Marcus Piper, MD  Pt. Profile: Marcus [name] is a 78yo male with PMHx significant for suspected CAD (not definitively diagnosed s/p abnormal stress Cardiolite study in 2008, refused cardiac cath multiple times), obesity, HTN, HL and gout who presents to [facility] ED today with complaints of chest pain, found to be in atrial fibrillation with RVR.   He had last seen Dr. Earnestine [name] on 04/26/11. He endorsed chronic DOE at that time believed to be secondary to deconditioning and obesity. A bedside echo was performed revealing LVEF 60-65% without gross valvular or structural abnormalities.   Problem List: Past Medical History  Diagnosis Date  . Coronary atherosclerosis of native coronary artery   . Pure hypercholesterolemia   . Nonspecific abnormal unspecified cardiovascular function study   . Other and unspecified hyperlipidemia   . Hypertension   . Gout     History reviewed. No pertinent past surgical history.   Allergies: No Known Allergies  HPI:   He reports a 3-4 day history of intermittent, dull SSCP chest pain lasting >1 hour at a times without radiation rated at a 3/10. This is associated with shortness of breath noting that this has worsened over the last week or so. He also reports associated DOE over the last month to the point where he cannot walk more than several yards or climb a ladder without becoming short of breath. This has also been associated with worsening weakness. He does endorse occasional palpitations that have remained unchanged and occur at anytime. Alleviating factors include sitting up on the side of the bed. Aggravating factors include expiration and after meals. He awoke around 4am this morning experiencing "indigestion-type hurting in the upper chest" described as  dull, rated at a 3-4 and constant with associated diaphoresis. He reports R>L swelling associated with a work injury. He notices this at the end of the day and upon waking in the morning the swelling is gone. He denies lightheadedness, n/v, fevers, chills, orthopnea, PND, cough, no urinary or bowel changes, tobacco, elicit drug use, sick contacts or unintentional weight loss. He states he drinks only water, and occasionally coffee. No history of blood clots.   Upon arrival to ED, EKG and telemetry revealed atrial fibrillation with RVR, HR in the 140s. CEs neg x 1. No electrolyte abnormalities. CBC WNL. pBNP elevated at 946.2. CXR revealing right basilar atelectasis related to R hemidiaphragm elevation, no acute findings otherwise. He was given a bolus of Cardizem and infusion with adequate rate-control.   Home Medications: Prior to Admission medications   Medication Sig Start Date End Date Taking? Authorizing Provider  allopurinol (ZYLOPRIM) 300 MG tablet Take 300 mg by mouth daily.     Yes Historical Provider, MD  amLODipine (NORVASC) 5 MG tablet Take 5 mg by mouth daily.   Yes Historical Provider, MD  aspirin 81 MG tablet Take 81 mg by mouth daily.     Yes Historical Provider, MD  atorvastatin (LIPITOR) 80 MG tablet Take 80 mg by mouth daily.   Yes Historical Provider, MD  doxazosin (CARDURA) 8 MG tablet Take 8 mg by mouth daily.    Yes Historical Provider, MD  fish oil-omega-3 fatty acids 1000 MG capsule Take 2 capsules by mouth 2 (two) times daily.     Yes Historical Provider, MD  isosorbide  dinitrate (ISORDIL) 20 MG tablet Take 20 mg by mouth 2 (two) times daily.   Yes Historical Provider, MD  metoprolol (LOPRESSOR) 50 MG tablet Take 50 mg by mouth 2 (two) times daily.   Yes Historical Provider, MD    Inpatient Medications:     . diltiazem  25 mg Intravenous Once    (Not in a hospital admission)  Family History  Problem Relation Age of Onset  . Colon cancer Son      History    Social History  . Marital Status: Married    Spouse Name: Marcus [name]    Number of Children: N/A  . Years of Education: N/A   Occupational History  . works with "heavy equipment"    Social History Main Topics  . Smoking status: Never Smoker   . Smokeless tobacco: Never Used  . Alcohol Use: Yes     Occasional   . Drug Use: No  . Sexually Active: Not on file   Other Topics Concern  . Not on file   Social History Narrative  . No narrative on file     Review of Systems: General: negative for chills, fever, night sweats or weight changes.  Cardiovascular: positive for chest pain, shortness of breath, dyspnea on exertion, edema, palpitations, negative for orthopnea, paroxysmal nocturnal dyspnea Dermatological: negative for rash Respiratory: negative for cough or wheezing Urologic: negative for hematuria Abdominal: negative for nausea, vomiting, diarrhea, bright red blood per rectum, melena, or hematemesis Neurologic: negative for visual changes, syncope, or dizziness All other systems reviewed and are otherwise negative except as noted above.  Physical Exam: Blood pressure 150/77, pulse 87, temperature 98.6 F (37 C), resp. rate 19, SpO2 93.00%.    General: Obese, in no acute distress. Head: Normocephalic, atraumatic, sclera non-icteric, no xanthomas Neck: Negative for carotid bruits. JVD not elevated. Lungs: Clear bilaterally to auscultation without wheezes, rales, or rhonchi. Breathing is unlabored. Heart: Irregularly irregular with clear S1 S2. No murmurs, rubs, or gallops appreciated. Abdomen: Soft, non-tender, non-distended with normoactive bowel sounds. No hepatomegaly. No rebound/guarding. No obvious abdominal masses. Msk: Strength and tone appears normal for age. Extremities: R LE edema, nonpitting. Round ecchymosis 4 x 4 cm on R posterior LE. No clubbing or cyanosis.  Distal pedal pulses are 2+ and equal bilaterally. Neuro: Alert and oriented X 3. Moves all extremities  spontaneously. Psych: Responds to questions appropriately with a normal affect.  Labs: Recent Labs  [facility] 11/15/11 0856   WBC 5.3   HGB 13.9   HCT 39.7   MCV 88.8   PLT 185    Lab 11/15/11 0856  NA 139  K 3.6  CL 102  CO2 26  BUN 11  CREATININE 0.83  CALCIUM 9.1  PROT 6.9  BILITOT 0.9  ALKPHOS 95  ALT 11  AST 18  AMYLASE --  LIPASE --  GLUCOSE 95    Recent Labs  Basename 11/15/11 0909   CKTOTAL 100   CKMB 3.0   CKMBINDEX --   TROPONINI <0.30    Radiology/Studies: Dg Chest Portable 1 View  11/15/2011  *RADIOLOGY REPORT*  Clinical Data: Chest pain for 3 days.  Hypertension and right leg swelling.  PORTABLE CHEST - 1 VIEW  Comparison: 01/04/2011.  Findings: 0926 hours.  There are persistent low lung volumes with asymmetric elevation of the right hemidiaphragm, similar to prior study.  Right basilar atelectasis appears unchanged.  There is no new airspace disease, pleural effusion or definite edema.  Heart size and mediastinal contours  are stable.  IMPRESSION: Stable examination with right basilar atelectasis related to right hemidiaphragm elevation.  No acute findings identified.  Original Report Authenticated By: Gerrianne Scale, M.D.    EKG:  11/15/11: atrial fibrillation with RVR, 135 bpm, LAD, ST depression V5, V6, I  08/23/11: sinus bradycardia, TWI V5, V6, aVL  ASSESSMENT AND PLAN:   1. Atrial fibrillation with RVR- noted on ED presentation, currently rate-controlled. Patient denies a history of arrhythmias. Notes occasional palpitations which has unchanged. He reports worsening chest pain with associated shortness of breath, worsening DOE (below). Sinus bradycardia noted on prior admission in 2012. Unsure as to the chronicity or exacerbation of the patient's atrial fibrillation- may be secondary to OHS/OSA, HTN or thyroid abnormality. He denies recent illness, alcohol/caffeine/drug use. Patient does have a suspected history of CAD evidenced by an abnormal  Cardiolite study in 2009. CHADSVASC score of of 3 currently.   - Admit to telemetry  - Start on heparin per pharmacy  - Continue Cardizem gtt  - 2D echocardiogram with contrast  - Heart healthy diet     2. Chest pain- noted on HPI, dull, intermittent occurring over the last 3-4 days. He reports associated dyspnea on exertion worsening over the last month exacerbated by walking short distances or climbing a ladder, which he had previously been able to perform. Cardiac work-up limited as he has declined cardiac catheterizations in the past. EKG in the ED revealed lateral ST depressions in the setting of a fib with RVR. CEs neg x 1. pBNP is elevated. CXR without evidence of pulmonary edema. His symptoms are concerning for unstable angina. He has some significant cardiac risk factors including HTN, HL and obesity. With limited cardiac work-up in the past, am more inclined to schedule for cardiac cath.   - Schedule for cath tomorrow  - Cycle CEs  - Daily ASA, continue BB, statin  - NTG SL PRN   3. RLE swelling- likely secondary to work-related injury. Chest pain non-pleuritic on description. Not tachypneic, O2 sat > 94-95%.   - Check D-dimer  3. HTN- related well-controlled with rate-control  - Continue Lopressor  4. HL  - Continue statin  Signed, R. Hurman Horn, PA-C 11/15/2011, 2:56 PM   Patient seen and examined.  Agree with findings of R. Arguello. Briefly, patient is a 78 year old with a history of suspected CAD (abnormal cardiolite, has refused cath in past) Presents with Increasing SOB/DOE and chest tightness.  Found to be in rapid afib.  Currently he denies CP On exam HR in 80s (afib, on IV diltiazem) Cardiac:  irreg irreg  No S3  Lungs are clear.  Ext show trace edema EKG with atrial fibrillation with RVR.  ST T wave changes concerning for ischemia.  I have discussed patients situation with patient and his family It would be important to r/o ischemia which may be exacerbated  by afib.  I would recomm L heart cath to define anatomy.  This has been suggested in past and patient has refused.  It will be important now since he will be placed on long term anticoagulation  IN regards to afib.  Rates are now controlled  Continue dilt for now.  Get echo.  Check TSH.  Heparin for now with eventual switch to oral agent.  Check lipids.  Keep on statin.

## 2011-11-15 NOTE — ED Provider Notes (Signed)
I saw and evaluated the patient, reviewed the resident's note and I agree with the findings and plan.   .Face to face Exam:  General:  Awake HEENT:  Atraumatic Resp:  Normal effort Abd:  Nondistended Neuro:No focal weakness Lymph: No adenopathy   CRITICAL CARE Performed by: Nelva Nay L   Total critical care time: 30 min  Critical care time was exclusive of separately billable procedures and treating other patients.  Critical care was necessary to treat or prevent imminent or life-threatening deterioration.  Critical care was time spent personally by me on the following activities: development of treatment plan with patient and/or surrogate as well as nursing, discussions with consultants, evaluation of patient's response to treatment, examination of patient, obtaining history from patient or surrogate, ordering and performing treatments and interventions, ordering and review of laboratory studies, ordering and review of radiographic studies, pulse oximetry and re-evaluation of patient's condition.   Nelia Shi, MD 11/15/11 1005

## 2011-11-15 NOTE — Progress Notes (Signed)
Pt arrived to 4731-1 via stretcher from ED. Heparin infusing at 16.73ml/hr and Cardizem infusing at 5 ml/hr.  Pt oriented to rm and hospital policies.  Pt has no complaints.  Will con't to monitor pt and will carry out new orders.

## 2011-11-15 NOTE — Progress Notes (Signed)
ANTICOAGULATION CONSULT NOTE - Initial Consult  Pharmacy Consult for  Heparin Indication:  Atrial Fib/RVR and Chest Pain  No Known Allergies  Patient Measurements: Height: 5\' 11"  (180.3 cm) Weight: 290 lb (131.543 kg) IBW/kg (Calculated) : 75.3  Heparin Dosing Weight: 92 kg  Vital Signs: Temp: 98.6 F (37 C) (03/21 0837) BP: 150/77 mmHg (03/21 1430) Pulse Rate: 87  (03/21 1430)  Labs:  Basename 11/15/11 0909 11/15/11 0856  HGB -- 13.9  HCT -- 39.7  PLT -- 185  APTT -- --  LABPROT -- --  INR -- --  HEPARINUNFRC -- --  CREATININE -- 0.83  CKTOTAL 100 --  CKMB 3.0 --  TROPONINI <0.30 --   Estimated Creatinine Clearance: 103.1 ml/min (by C-G formula based on Cr of 0.83).  Medical History: Past Medical History  Diagnosis Date  . Coronary atherosclerosis of native coronary artery   . Pure hypercholesterolemia   . Nonspecific abnormal unspecified cardiovascular function study   . Other and unspecified hyperlipidemia   . Hypertension   . Gout     Medications:  Allopurinol 300 mg daily Amlodipine 5 mg daily Aspirin 81 mg daily Doxazosin 8 mg daily Fish Oil-Omega-3 fatty acids 2000 mg twice daily Isordil 20 mg twice daily Metoprolol 50 mg twice daily  Assessment: 78 yo male admitted with complaints of chest pain and found to have atrial fibrillation with RVR.  He has a history of CAD, hypercholesterolemia, hypertension as well.  Per the patient, he has had no bleeding complications over the last couple of weeks and has no history of GI issues.  He is morbidly obese and we will use weight adjusted dosing for him.  Goal of Therapy:  Heparin level 0.3-0.7 units/ml   Plan:  Will give a Heparin bolus of 5000 units IV x 1, begin IV heparin drip at 1650 units/hr.   Obtain a heparin level 6 hours after starting to ensure therapeutic response.  Nadara Mustard, Ilda Basset D., MS Clinical Pharmacist Pager:  [phone]  11/15/2011,3:19 PM

## 2011-11-15 NOTE — ED Notes (Signed)
Date: 11/15/2011  Rate: 135  Rhythm: atrial fibrillation  QRS Axis: normal  Intervals: normal  ST/T Wave abnormalities: nonspecific ST/T changes  Conduction Disutrbances:none  Narrative Interpretation: Atrial fibrillation with rapid ventricular response low voltage. Poor R wave progression with Q waves in V1 and V2 consistent with old anteroseptal myocardial infarction. ST depression in the inferior oral leads with nonspecific T wave flattening consistent with ischemia. Ischemic changes may be related to rate. When compared with ECG of 08/23/2011, atrial fibrillation and ST depression are new.  Old EKG Reviewed: changes noted    Dione Booze, MD 11/15/11 [phone]

## 2011-11-15 NOTE — ED Notes (Signed)
Chest pain x 3 days has edema  Rt leg some sob

## 2011-11-15 NOTE — ED Provider Notes (Signed)
History     CSN: [id number]  Arrival date & time 11/15/11  2130   First MD Initiated Contact with Patient 11/15/11 [phone]      Chief Complaint  Patient presents with  . Chest Pain    (Consider location/radiation/quality/duration/timing/severity/associated sxs/prior treatment) HPI The patient is a 78 yo man, history of possible CAD (abnl cardiolite, no prior cath), HTN, HL, and gout, presenting with chest pain.  The patient notes a 2-3 day history of "indigestion", described as a midsternal dull burning sensation, 3/10 in severity, no radiation, which comes and goes throughout the day 3-4 times/day in 30-min to 3-hr episodes.  The pain is not worsened by exertion, and is not associated with nausea or diaphoresis.  The patient reports some worsening SOB with exertion, but the SOB does not necessarily correlate with the chest pain.  The pain is worse during the exhalation phase of deep breathing, and appears to be worsened by food.  It is alleviated by lying down.  It seemed to be worse this morning than usual, prompting admission.  The patient also notes occasional palpitations from time to time, but is not experiencing palpitations now.  No fevers, chills, abd pain, n/v.  The patient follows with [facility], and is noted to have an abnormal stress cardioview in 2008, but has always declined catheterization.  Echo shows EF 60-65%, no valvular abnormalities.  The patient also notes a multiple-year history of SOB, which has gradually worsened over the last month, previously able to walk 2 blocks, now only able to walk across the room.  He also notes a 1-week history of right leg edema, following a work injury, with focal medial right lower leg swelling, discoloration, and pain, as well as generalized right lower extremity edema to the knee.  He has no history of DVT.    Past Medical History  Diagnosis Date  . Coronary atherosclerosis of native coronary artery   . Pure hypercholesterolemia     . Nonspecific abnormal unspecified cardiovascular function study   . Other and unspecified hyperlipidemia   . Hypertension   . Gout     History reviewed. No pertinent past surgical history.  No family history on file. No family history of MI or CAD  History  Substance Use Topics  . Smoking status: Never Smoker   . Smokeless tobacco: Never Used  . Alcohol Use: Not on file     Review of Systems ROS General: no fevers, chills, changes in weight, changes in appetite Skin: no rash HEENT: no blurry vision, hearing changes, sore throat Pulm: no dyspnea, coughing, wheezing CV: see HPI Abd: no abdominal pain, nausea/vomiting, diarrhea/constipation GU: no dysuria, hematuria, polyuria Ext: no arthralgias, myalgias Neuro: no weakness, numbness, or tingling  Allergies  Review of patient's allergies indicates no known allergies.  Home Medications   Current Outpatient Rx  Name Route Sig Dispense Refill  . ALLOPURINOL 300 MG PO TABS Oral Take 300 mg by mouth daily.      Marland Kitchen AMLODIPINE BESYLATE 5 MG PO TABS Oral Take 5 mg by mouth daily.    . ASPIRIN 81 MG PO TABS Oral Take 81 mg by mouth daily.      . ATORVASTATIN CALCIUM 80 MG PO TABS Oral Take 80 mg by mouth daily.    Marland Kitchen DOXAZOSIN MESYLATE 8 MG PO TABS Oral Take 8 mg by mouth daily.     . OMEGA-3 FATTY ACIDS 1000 MG PO CAPS Oral Take 2 capsules by mouth 2 (two) times daily.      Marland Kitchen  ISOSORBIDE DINITRATE 20 MG PO TABS Oral Take 20 mg by mouth 2 (two) times daily.    Marland Kitchen METOPROLOL TARTRATE 50 MG PO TABS Oral Take 50 mg by mouth 2 (two) times daily.      BP 158/97  Pulse 146  Temp 98.6 F (37 C)  Resp 20  SpO2 96%  Physical Exam General: alert, cooperative, and in no apparent distress HEENT: pupils equal round and reactive to light, vision grossly intact, oropharynx clear and non-erythematous  Neck: supple, no lymphadenopathy Lungs: clear to ascultation bilaterally, normal work of respiration, no wheezes, rales, ronchi Heart:  tachycardic, irregular rhythm, no m/g/r Abdomen: soft, non-tender, non-distended, normal bowel sounds Extremities: RLE with 2+ pitting edema to the level of the knee, also with medial area of increased edema, hyperpigmentation, and pain with deep palpation.  LLE with non-pitting edema. Neurologic: alert & oriented X3, cranial nerves II-XII intact, strength grossly intact, sensation intact to light touch  ED Course  Procedures (including critical care time)   Labs Reviewed  COMPREHENSIVE METABOLIC PANEL  CBC  DIFFERENTIAL  TSH  PRO B NATRIURETIC PEPTIDE  CARDIAC PANEL(CRET KIN+CKTOT+MB+TROPI)   No results found.   No diagnosis found.  EKG: afib with RVR, possible ST depressions seen V4-V6  MDM  The patient is a 78 yo man, history of possible CAD, HTN, HL, and gout, presenting with chest pain, found to be in afib with RVR.  # afib with RVR/chest pain - patient presents with afib on EKG, with HR in the 140's.  BP stable/elevated.  Patient notes atypical chest pain, though has possible ST depressions seen on EKG anterior leads.  Patient has history of abnormal cardiolite study in past, but no prior cath or regional wall motion abnormalities on echo.  Risk factors of HTN, HL, and age.  No smoking or FH. -diltiazem injection; consider infusion after assessing response to injection -cbc, cmp, CE, pro-BNP  # SOB/LE edema - may represent component of CHF (though EF = 60-65%).  Focal LE edema may represent hematoma vs DVT (Wells score for DVT = 3, indicating high probability). -will focus on acute afib at this time, will defer decision for LE doppler or CTA for now -pro-BNP  # Dispo - will observe and obtain initial labs, likely admit.   Linward Headland, MD 11/15/11 1610  Linward Headland, MD 11/15/11 [phone]

## 2011-11-16 ENCOUNTER — Other Ambulatory Visit: Payer: Self-pay

## 2011-11-16 ENCOUNTER — Encounter (HOSPITAL_COMMUNITY)
Admission: EM | Disposition: A | Discharge status: Home Health | Payer: Self-pay | Source: Ambulatory Visit | Attending: Cardiothoracic Surgery

## 2011-11-16 DIAGNOSIS — I2510 Atherosclerotic heart disease of native coronary artery without angina pectoris: Secondary | ICD-10-CM

## 2011-11-16 HISTORY — PX: LEFT AND RIGHT HEART CATHETERIZATION WITH CORONARY ANGIOGRAM: SHX5449

## 2011-11-16 LAB — BASIC METABOLIC PANEL
BUN: 9 mg/dL (ref 6–23)
GFR calc non Af Amer: 77 mL/min — ABNORMAL LOW (ref >90)
Glucose, Bld: 130 mg/dL — ABNORMAL HIGH (ref 70–99)
Potassium: 3.5 mEq/L (ref 3.5–5.1)

## 2011-11-16 LAB — POCT I-STAT 3, VENOUS BLOOD GAS (G3P V)
Acid-base deficit: 4 mmol/L — ABNORMAL HIGH (ref 0.0–2.0)
Bicarbonate: 22.7 mEq/L (ref 20.0–24.0)
TCO2: 24 mmol/L (ref 0–100)
pH, Ven: 7.274 (ref 7.250–7.300)

## 2011-11-16 LAB — HEPARIN LEVEL (UNFRACTIONATED): Heparin Unfractionated: 0.75 IU/mL — ABNORMAL HIGH (ref 0.30–0.70)

## 2011-11-16 LAB — LIPID PANEL
Cholesterol: 107 mg/dL (ref 0–200)
HDL: 38 mg/dL — ABNORMAL LOW (ref >39)
Total CHOL/HDL Ratio: 2.8 RATIO
Triglycerides: 96 mg/dL (ref <150)

## 2011-11-16 LAB — CBC
HCT: 36.4 % — ABNORMAL LOW (ref 39.0–52.0)
Hemoglobin: 12.3 g/dL — ABNORMAL LOW (ref 13.0–17.0)
MCHC: 33.8 g/dL (ref 30.0–36.0)

## 2011-11-16 LAB — CARDIAC PANEL(CRET KIN+CKTOT+MB+TROPI): Total CK: 83 U/L (ref 7–232)

## 2011-11-16 LAB — POCT I-STAT 3, ART BLOOD GAS (G3+)
Acid-Base Excess: 2 mmol/L (ref 0.0–2.0)
O2 Saturation: 99 %
pO2, Arterial: 125 mmHg — ABNORMAL HIGH (ref 80.0–100.0)

## 2011-11-16 SURGERY — LEFT AND RIGHT HEART CATHETERIZATION WITH CORONARY ANGIOGRAM
Anesthesia: LOCAL

## 2011-11-16 MED ORDER — ACETAMINOPHEN 325 MG PO TABS: 650.0000 mg | ORAL_TABLET | ORAL | Status: DC | PRN

## 2011-11-16 MED ORDER — HEPARIN (PORCINE) IN NACL 2-0.9 UNIT/ML-% IJ SOLN
INTRAMUSCULAR | Status: AC
  Filled 2011-11-16: qty 2000

## 2011-11-16 MED ORDER — FENTANYL CITRATE 0.05 MG/ML IJ SOLN
INTRAMUSCULAR | Status: AC
  Filled 2011-11-16: qty 2

## 2011-11-16 MED ORDER — SODIUM CHLORIDE 0.9 % IV SOLN
INTRAVENOUS | Status: AC
  Administered 2011-11-16: 12:00:00 via INTRAVENOUS

## 2011-11-16 MED ORDER — NITROGLYCERIN 0.2 MG/ML ON CALL CATH LAB
INTRAVENOUS | Status: AC
  Filled 2011-11-16: qty 1

## 2011-11-16 MED ORDER — LIDOCAINE HCL (PF) 1 % IJ SOLN
INTRAMUSCULAR | Status: AC
  Filled 2011-11-16: qty 30

## 2011-11-16 MED ORDER — ONDANSETRON HCL 4 MG/2ML IJ SOLN: 4.0000 mg | Freq: Four times a day (QID) | INTRAMUSCULAR | Status: DC | PRN

## 2011-11-16 MED ORDER — ASPIRIN EC 325 MG PO TBEC
325.0000 mg | DELAYED_RELEASE_TABLET | Freq: Every day | ORAL | Status: DC
  Administered 2011-11-17 – 2011-11-18 (×2): 325 mg via ORAL
  Filled 2011-11-16 (×4): qty 1

## 2011-11-16 MED ORDER — MIDAZOLAM HCL 2 MG/2ML IJ SOLN
INTRAMUSCULAR | Status: AC
  Filled 2011-11-16: qty 2

## 2011-11-16 MED ORDER — HEPARIN (PORCINE) IN NACL 100-0.45 UNIT/ML-% IJ SOLN
1550.0000 [IU]/h | INTRAMUSCULAR | Status: DC
  Administered 2011-11-16 – 2011-11-20 (×6): 1550 [IU]/h via INTRAVENOUS
  Filled 2011-11-16 (×9): qty 250

## 2011-11-16 NOTE — Progress Notes (Signed)
ANTICOAGULATION CONSULT NOTE - Follow Up  Pharmacy Consult for  Heparin Indication:  Atrial Fib/RVR and Chest Pain  No Known Allergies  Patient Measurements: Height: 5\' 11"  (180.3 cm) Weight: 281 lb 1.4 oz (127.5 kg) (scale C) IBW/kg (Calculated) : 75.3  Heparin Dosing Weight: 92 kg  Vital Signs: Temp: 98.6 F (37 C) (03/22 0449) Temp src: Oral (03/22 0449) BP: 135/75 mmHg (03/22 0449) Pulse Rate: 66  (03/22 0449)  Labs:  Basename 11/16/11 0412 11/15/11 2228 11/15/11 1720 11/15/11 0856  HGB 12.3* -- -- 13.9  HCT 36.4* -- -- 39.7  PLT 172 -- -- 185  APTT -- -- -- --  LABPROT 15.1 -- -- --  INR 1.17 -- -- --  HEPARINUNFRC 0.75* 0.64 -- --  CREATININE 0.99 -- -- 0.83  CKTOTAL 83 96 100 --  CKMB 2.7 3.4 4.5* --  TROPONINI 0.62* 0.97* 1.08* --   Estimated Creatinine Clearance: 85 ml/min (by C-G formula based on Cr of 0.99).  Medical History: Past Medical History  Diagnosis Date  . Coronary atherosclerosis of native coronary artery   . Pure hypercholesterolemia   . Nonspecific abnormal unspecified cardiovascular function study   . Other and unspecified hyperlipidemia   . Hypertension   . Gout   . Angina   . Myocardial infarction   . Dysrhythmia     atrial fibrilation  . Shortness of breath   . Chronic kidney disease     hx of BPH  . Arthritis     Medications:  Allopurinol 300 mg daily Amlodipine 5 mg daily Aspirin 81 mg daily Doxazosin 8 mg daily Fish Oil-Omega-3 fatty acids 2000 mg twice daily Isordil 20 mg twice daily Metoprolol 50 mg twice daily  Assessment: 78 yo male admitted with complaints of chest pain and found to have atrial fibrillation with RVR.  Heparin level (0.75) is above-goal on 1650 units/hr.   Goal of Therapy:  Heparin level 0.3-0.7 units/ml   Plan:  1. Decrease IV heparin to 1550 units/hr. 2. Heparin level in 8 hours.   Lorre Munroe, PharmD 11/16/2011,5:16 AM

## 2011-11-16 NOTE — CV Procedure (Signed)
Cardiac Catheterization Procedure Note  Name: Marcus [name] MRN: [id number] DOB: 1933-04-16  Procedure: Right Heart Cath, Left Heart Cath, Selective Coronary Angiography, LV angiography  Indication: Elevated troponin, progressive angina, atrial fib with RVR. This 78 year old gentleman has a history of presumed coronary artery disease based on anginal symptoms and abnormal stress test in the past. He has refused cardiac catheterization up until now. He presented this hospital admission with atrial fibrillation and worsening chest pain. He ruled in for a non-ST elevation infarction with an elevated troponin.   Procedural Details: The right groin was prepped, draped, and anesthetized with 1% lidocaine. Using the modified Seldinger technique a 5 French sheath was placed in the right femoral artery and a 7 French sheath was placed in the right femoral vein. A Swan-Ganz catheter was used for the right heart catheterization. Standard protocol was followed for recording of right heart pressures and sampling of oxygen saturations. Fick cardiac output was calculated. Standard Judkins catheters were used for selective coronary angiography and left ventriculography. There were no immediate procedural complications. The patient was transferred to the post catheterization recovery area for further monitoring.  Procedural Findings: Hemodynamics RA 9 RV 30/9 PA 34/13 with a mean of 24 PCWP  LV 112/9 AO 109/71 with a mean of 87  Oxygen saturations: PA 59% AO 99%  Cardiac Output (Fick) 4.9  Cardiac Index (Fick) 2.0   Coronary angiography: Coronary dominance: right  Left mainstem: Widely patent with mild irregularity and nonobstructive distal left main stenosis.  Left anterior descending (LAD): The LAD is diffusely irregular. The mid LAD beyond a large first diagonal branch has a severe 95-99% stenosis with an ulcerated area on the distal portion of the lesion. Beyond that the LAD is an excellent  target vessel with diffuse irregularity but is a fairly large caliber vessel that reaches the left ventricular apex.  Left circumflex (LCx): The left circumflex is patent. The vessel has diffuse irregularity with nonobstructive stenosis in the range of 50% throughout the midportion of the circumflex. It gives off a large obtuse marginal branch and a second OM distribution.  Right coronary artery (RCA): The right coronary artery is dominant. The vessel has severe proximal stenosis of 75%. Just after the origin of the acute marginal branch there is subtotal occlusion of the right coronary artery with 99% stenosis. The vessel has severe diffuse disease beyond that with TIMI 2 antegrade flow. The PDA and posterolateral branches are patent.  Left ventriculography: Left ventricular systolic function is normal, LVEF is estimated at 55-65%, there is no significant mitral regurgitation   Final Conclusions:   1. Severe mid LAD stenosis 2. Severe diffuse mid RCA stenosis with subtotal occlusion in the mid vessel 3. Mild to moderate nonobstructive left circumflex stenosis 4. Preserved left ventricular function  Recommendations: The patient has severe two-vessel coronary artery disease with preserved LV function. Importantly, he also has newly diagnosed atrial fibrillation and will require long-term anticoagulation for prevention of thromboembolism. He has a history of poor medical compliance and has not wanted to proceed with cardiac catheterization in the past despite recommendations to do so. I am concerned about his ability to adhere to a long-term program of dual antiplatelet therapy or anticoagulation therapy. While PCI would be technically feasible, I think that this patient may be better served with coronary bypass surgery for complete revascularization and this would also take away the need for a combination of both antiplatelet and anticoagulant therapy following revascularization. I going to request a  cardiac surgery consultation for review. Because of his critical coronary disease, he will be restarted on IV heparin following the procedure. Of note, the patient is not having active chest discomfort.   Tonny Bollman 11/16/2011, 11:54 AM

## 2011-11-16 NOTE — Progress Notes (Signed)
Patient's heart rate increased to the 150s non-sustaining. No complaints and no signs and symptoms of pain. Patient was up using the urinal. PA notified and no new orders given. Will continue to monitor patient for further changes in condition.

## 2011-11-16 NOTE — Progress Notes (Signed)
[name] was 46 & asympmatic.  Dr. Mayford Knife notified.  Will send DR.  To round on him in the morning.  Marcus [name]

## 2011-11-16 NOTE — Progress Notes (Signed)
[name] 160 denies any pain or discomfort.  BP `132/82, P74  Dr. Mayford Knife informed,  Will continue to monitor.

## 2011-11-16 NOTE — Progress Notes (Signed)
ANTICOAGULATION CONSULT NOTE - Follow Up Consult  Pharmacy Consult for heparin Indication: Afib, CP, s/p cath awaiting possible OHS  No Known Allergies  Patient Measurements: Height: 5\' 11"  (180.3 cm) Weight: 281 lb 1.4 oz (127.5 kg) (scale C) IBW/kg (Calculated) : 75.3  Heparin Dosing Weight: 92kg  Vital Signs: Temp: 98.6 F (37 C) (03/22 0449) Temp src: Oral (03/22 0449) BP: 143/93 mmHg (03/22 0933) Pulse Rate: 62  (03/22 1108)  Labs:  Basename 11/16/11 0412 11/15/11 2228 11/15/11 1720 11/15/11 0856  HGB 12.3* -- -- 13.9  HCT 36.4* -- -- 39.7  PLT 172 -- -- 185  APTT -- -- -- --  LABPROT 15.1 -- -- --  INR 1.17 -- -- --  HEPARINUNFRC 0.75* 0.64 -- --  CREATININE 0.99 -- -- 0.83  CKTOTAL 83 96 100 --  CKMB 2.7 3.4 4.5* --  TROPONINI 0.62* 0.97* 1.08* --   Estimated Creatinine Clearance: 85 ml/min (by C-G formula based on Cr of 0.99).  Assessment: 38 yom admitted with complaints of CP and found have be in afib w/ RVR. Pt started on IV heparin. This AM heparin level was high and dose was adjusted. Pt was taken to the cath lab prior to being able to assess new dose. Now s/p cath. MD reported pt with severe 2-vessel coronary artery disease with preserved LV function. Due to this along with history of medical non-compliance, plan to consult cardiac surgery for possible CABG. Heparin is to restart 8 hours post-sheath removal.   Goal of Therapy:  Heparin level 0.3-0.7 units/ml   Plan:  1. Restart heparin at previous rate of 1550units/hr at 2100 tonight 2. Check a heparin level at 0500 on 3/23 3. Daily heparin level and CBC 4. F/u surgery plans  Marcus [name], Drake Leach 11/16/2011,1:58 PM

## 2011-11-16 NOTE — Interval H&P Note (Signed)
History and Physical Interval Note:  11/16/2011 11:10 AM  Marcus [name]  has presented today for surgery, with the diagnosis of Chest pain  The various methods of treatment have been discussed with the patient and family. After consideration of risks, benefits and other options for treatment, the patient has consented to  Procedure(s) (LRB): LEFT AND RIGHT HEART CATHETERIZATION WITH CORONARY ANGIOGRAM (N/A) as a surgical intervention .  The patients' history has been reviewed, patient examined, no change in status, stable for surgery.  I have reviewed the patients' chart and labs.  Questions were answered to the patient's satisfaction.     Tonny Bollman  11/16/2011 11:10 AM

## 2011-11-16 NOTE — Progress Notes (Signed)
Pt had 2.35 second pause.  Asymptomatic . Dr.  Mayford Knife notified,  Will cont. To monitor.  Marcus [name].

## 2011-11-16 NOTE — Progress Notes (Signed)
UR Completed. Simmons, Charlise Giovanetti F [phone]  

## 2011-11-17 DIAGNOSIS — Z01810 Encounter for preprocedural cardiovascular examination: Secondary | ICD-10-CM

## 2011-11-17 DIAGNOSIS — I2510 Atherosclerotic heart disease of native coronary artery without angina pectoris: Secondary | ICD-10-CM

## 2011-11-17 DIAGNOSIS — I200 Unstable angina: Secondary | ICD-10-CM

## 2011-11-17 LAB — CBC
HCT: 36.8 % — ABNORMAL LOW (ref 39.0–52.0)
Hemoglobin: 12.4 g/dL — ABNORMAL LOW (ref 13.0–17.0)
MCH: 30.5 pg (ref 26.0–34.0)
MCV: 90.6 fL (ref 78.0–100.0)
RBC: 4.06 MIL/uL — ABNORMAL LOW (ref 4.22–5.81)

## 2011-11-17 LAB — BASIC METABOLIC PANEL
BUN: 8 mg/dL (ref 6–23)
CO2: 27 mEq/L (ref 19–32)
Calcium: 8.7 mg/dL (ref 8.4–10.5)
Creatinine, Ser: 0.99 mg/dL (ref 0.50–1.35)
Glucose, Bld: 91 mg/dL (ref 70–99)

## 2011-11-17 LAB — GLUCOSE, CAPILLARY

## 2011-11-17 MED ORDER — ASPIRIN 81 MG PO CHEW
CHEWABLE_TABLET | ORAL | Status: AC
  Filled 2011-11-17: qty 3

## 2011-11-17 MED ORDER — LISINOPRIL 5 MG PO TABS
5.0000 mg | ORAL_TABLET | Freq: Every day | ORAL | Status: DC
  Administered 2011-11-17 – 2011-11-18 (×2): 5 mg via ORAL
  Filled 2011-11-17 (×5): qty 1

## 2011-11-17 NOTE — Progress Notes (Signed)
Subjective:  Patient had cath yesterday.  Will need CABG.  Awaiting surgery consult. No chest pain overnight.  Remains on IV heparin.  Objective:  Vital Signs in the last 24 hours: Temp:  [98 F (36.7 C)-98.4 F (36.9 C)] 98.2 F (36.8 C) (03/23 0525) Pulse Rate:  [62-101] 85  (03/23 0525) Resp:  [18-20] 20  (03/22 2104) BP: (122-170)/(72-97) 142/82 mmHg (03/23 0544) SpO2:  [92 %-98 %] 92 % (03/23 0525) Weight:  [127.279 kg (280 lb 9.6 oz)] 127.279 kg (280 lb 9.6 oz) (03/23 0525)  Intake/Output from previous day: 03/22 0701 - 03/23 0700 In: 1388 [P.O.:840; I.V.:548] Out: 901 [Urine:900; Stool:1] Intake/Output from this shift: Total I/O In: 320 [P.O.:320] Out: -      . allopurinol  300 mg Oral Daily  . aspirin EC  325 mg Oral Daily  . atorvastatin  80 mg Oral q1800  . diazepam  5 mg Oral On Call  . doxazosin  8 mg Oral Daily  . fentaNYL      . heparin      . isosorbide dinitrate  20 mg Oral BID  . lidocaine      . metoprolol  50 mg Oral BID  . midazolam      . nitroGLYCERIN      . omega-3 acid ethyl esters  2 g Oral BID  . sodium chloride  3 mL Intravenous Q12H  . DISCONTD: aspirin EC  81 mg Oral Daily  . DISCONTD: sodium chloride  3 mL Intravenous Q12H      . sodium chloride 50 mL/hr at 11/16/11 1215  . diltiazem (CARDIZEM) infusion 5 mg/hr (11/16/11 2338)  . heparin 1,550 Units/hr (11/17/11 0709)  . DISCONTD: sodium chloride 100 mL/hr at 11/15/11 2254  . DISCONTD: heparin 1,550 Units/hr (11/16/11 1610)    Physical Exam: The patient appears to be in no distress.  Head and neck exam reveals that the pupils are equal and reactive.  The extraocular movements are full.  There is no scleral icterus.  Mouth and pharynx are benign.  No lymphadenopathy.  No carotid bruits.  The jugular venous pressure is normal.  Thyroid is not enlarged or tender.  Chest is clear to percussion and auscultation.  No rales or rhonchi.  Expansion of the chest is symmetrical.  Heart  reveals no abnormal lift or heave.  First and second heart sounds are normal.  There is no murmur gallop rub or click.  The abdomen is soft and nontender.  Bowel sounds are normoactive.  There is no hepatosplenomegaly or mass.  There are no abdominal bruits.  No groin hematoma.   Extremities reveal no phlebitis or edema.  Pedal pulses are good.  There is no cyanosis or clubbing.  Neurologic exam is normal strength and no lateralizing weakness.  No sensory deficits.  Integument reveals no rash  Lab Results:  Basename 11/17/11 0625 11/16/11 0412  WBC 5.4 5.9  HGB 12.4* 12.3*  PLT 167 172    Basename 11/17/11 0625 11/16/11 0412  NA 140 138  K 3.6 3.5  CL 106 103  CO2 27 28  GLUCOSE 91 130*  BUN 8 9  CREATININE 0.99 0.99    Basename 11/16/11 0412 11/15/11 2228  TROPONINI 0.62* 0.97*   Hepatic Function Panel  Basename 11/15/11 0856  PROT 6.9  ALBUMIN 3.4*  AST 18  ALT 11  ALKPHOS 95  BILITOT 0.9  BILIDIR --  IBILI --    Basename 11/16/11 0412  CHOL 107  No results found for this basename: PROTIME in the last 72 hours  Imaging: Imaging results have been reviewed  Cardiac Studies: See cath report. Telemetry shows atrial fib. Assessment/Plan:  Patient Active Hospital Problem List: Atrial fibrillation with RVR (11/15/2011)   Assessment: Rate controlled   Plan: Continue IV heparin Pure hypercholesterolemia (10/18/2009)   Assessment: Continue lipitor  HTN (hypertension) (11/15/2011)   Assessment: Stable   Plan: Continue metoprolol. Add ACEi Chest pain (11/15/2011)   Assessment: No pain overnight   Plan: CABG   LOS: 2 days    Cassell Clement 11/17/2011, 10:39 AM

## 2011-11-17 NOTE — Consult Note (Signed)
CARDIOTHORACIC SURGERY CONSULTATION REPORT  PCP is No primary provider on file. Referring Provider is Tonny Bollman, MD   Reason for consultation:  Severe 2-vessel CAD  HPI:  Patient is a 78 year old male from [place], [place] with no previous history of coronary artery disease although he has been suspected to have it based upon an abnormal stress Cardiolite exam in 2008. The patient also has risk factors including history of hypertension and hyperlipidemia.  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 The patient does admit that for several months he has had progressive exertional shortness of breath. Over the last several days prior to admission the patient developed increased exertional shortness of breath as well as intermittent episodes of substernal chest discomfort. Presented to the emergency department 2 days ago with ongoing chest discomfort and was found to be in atrial fibrillation with rapid ventricular response.  He was admitted to the hospital and ruled out for acute myocardial infarction based on serial cardiac enzymes, although troponin levels were mildly elevated. Atrial fibrillation was treated with intravenous heparin and Cardizem drip for rate control, and the patient's symptoms resolved although he remains in atrial fibrillation at this time. The patient underwent cardiac catheterization yesterday by Dr. Excell Seltzer and was found to have severe two-vessel coronary artery disease with preserved left ventricular systolic function. The patient is felt to be relatively poor candidate for percutaneous coronary intervention do to concerns regarding whether or not he would remain compliant with antiplatelet therapy and given the potential concurrent need for anticoagulation with Coumadin because of atrial fibrillation. Cardiothoracic surgical consultation has been requested.  Past  Medical History  Diagnosis Date  . Coronary atherosclerosis of native coronary artery   . Pure hypercholesterolemia   . Nonspecific abnormal unspecified cardiovascular function study   . Other and unspecified hyperlipidemia   . Hypertension   . Gout   . Angina   . Myocardial infarction   . Dysrhythmia     atrial fibrilation  . Shortness of breath   . Chronic kidney disease     hx of BPH  . Arthritis     Past Surgical History  Procedure Date  . Cataracts     Family History  Problem Relation Age of Onset  . Colon cancer Son     Social History History  Substance Use Topics  . Smoking status: Never Smoker   . Smokeless tobacco: Never Used  . Alcohol Use: Yes     Occasional     Prior to Admission medications   Medication Sig Start Date End Date Taking? Authorizing Provider  allopurinol (ZYLOPRIM) 300 MG tablet Take 300 mg by mouth daily.     Yes Historical Provider, MD  amLODipine (NORVASC) 5 MG tablet Take 5 mg by mouth daily.   Yes Historical Provider, MD  aspirin 81 MG tablet Take 81 mg by mouth daily.     Yes Historical Provider, MD  atorvastatin (LIPITOR) 80 MG tablet Take 80 mg by mouth daily.   Yes Historical Provider, MD  doxazosin (CARDURA) 8 MG tablet Take 8 mg by mouth daily.    Yes Historical Provider, MD  fish oil-omega-3 fatty acids 1000 MG capsule Take 2 capsules by mouth 2 (two) times daily.     Yes Historical Provider, MD  isosorbide dinitrate (ISORDIL) 20 MG tablet Take 20 mg by mouth 2 (two) times daily.   Yes  Historical Provider, MD  metoprolol (LOPRESSOR) 50 MG tablet Take 50 mg by mouth 2 (two) times daily.   Yes Historical Provider, MD    Current Facility-Administered Medications  Medication Dose Route Frequency Provider Last Rate Last Dose  . 0.9 %  sodium chloride infusion  250 mL Intravenous PRN Roger A Arguello, PA-C      . 0.9 %  sodium chloride infusion   Intravenous Continuous Tonny Bollman, MD 50 mL/hr at 11/16/11 1215    . acetaminophen  (TYLENOL) tablet 650 mg  650 mg Oral Q4H PRN Roger A Arguello, PA-C      . allopurinol (ZYLOPRIM) tablet 300 mg  300 mg Oral Daily Roger A Arguello, PA-C   300 mg at 11/17/11 0946  . ALPRAZolam Prudy Feeler) tablet 0.25 mg  0.25 mg Oral BID PRN Roger A Arguello, PA-C      . aspirin 81 MG chewable tablet           . aspirin EC tablet 325 mg  325 mg Oral Daily Tonny Bollman, MD      . atorvastatin (LIPITOR) tablet 80 mg  80 mg Oral q1800 Roger A Arguello, PA-C   80 mg at 11/16/11 1714  . diltiazem (CARDIZEM) 100 mg in dextrose 5 % 100 mL infusion  5 mg/hr Intravenous Titrated Nelia Shi, MD 5 mL/hr at 11/16/11 2338 5 mg/hr at 11/16/11 2338  . doxazosin (CARDURA) tablet 8 mg  8 mg Oral Daily Roger A Arguello, PA-C   8 mg at 11/17/11 0946  . heparin ADULT infusion 100 units/mL (25000 units/250 mL)  1,550 Units/hr Intravenous Continuous Drake Leach Rumbarger, PHARMD 15.5 mL/hr at 11/17/11 0709 1,550 Units/hr at 11/17/11 0709  . isosorbide dinitrate (ISORDIL) tablet 20 mg  20 mg Oral BID Roger A Arguello, PA-C   20 mg at 11/17/11 0947  . lisinopril (PRINIVIL,ZESTRIL) tablet 5 mg  5 mg Oral Daily Cassell Clement, MD      . metoprolol (LOPRESSOR) tablet 50 mg  50 mg Oral BID Roger A Arguello, PA-C   50 mg at 11/17/11 0947  . nitroGLYCERIN (NITROSTAT) SL tablet 0.4 mg  0.4 mg Sublingual Q5 Min x 3 PRN Roger A Arguello, PA-C      . omega-3 acid ethyl esters (LOVAZA) capsule 2 g  2 g Oral BID Roger A Arguello, PA-C   2 g at 11/17/11 0948  . ondansetron (ZOFRAN) injection 4 mg  4 mg Intravenous Q6H PRN Roger A Arguello, PA-C      . sodium chloride 0.9 % injection 3 mL  3 mL Intravenous Q12H Roger A Arguello, PA-C   3 mL at 11/17/11 0954  . sodium chloride 0.9 % injection 3 mL  3 mL Intravenous PRN Roger A Arguello, PA-C      . zolpidem (AMBIEN) tablet 5 mg  5 mg Oral QHS PRN Roger A Arguello, PA-C        No Known Allergies  Review of Systems:  General:  normal appetite, normal energy   Respiratory:  no  cough, no wheezing, no hemoptysis, no pain with inspiration or cough, + exertional shortness of breath   Cardiac:  + chest pain or tightness prior to admission but none since, + exertional SOB, no resting SOB, no PND, no orthopnea, + chronic LE edema R>L, no palpitations, no syncope  GI:   no difficulty swallowing, no hematochezia, no hematemesis, no melena, + constipation, no diarrhea   GU:   no dysuria, no urgency, no frequency  Musculoskeletal: + mild arthritis right hand, left shoulder, hips - ambulates well without any assistance  Vascular:  no pain suggestive of claudication   Neuro:   no symptoms suggestive of TIA's, no seizures, no headaches, no peripheral neuropathy   Endocrine:  Negative   HEENT:  no loose teeth or painful teeth,  no recent vision changes  Psych:   no anxiety, no depression    Physical Exam:   BP 109/60  Pulse 56  Temp(Src) 98.1 F (36.7 C) (Oral)  Resp 20  Ht 5\' 11"  (1.803 m)  Wt 127.279 kg (280 lb 9.6 oz)  BMI 39.14 kg/m2  SpO2 92%  General:    well-appearing  HEENT:  Unremarkable   Neck:   no JVD, no bruits, no adenopathy   Chest:   clear to auscultation, symmetrical breath sounds, no wheezes, no rhonchi   CV:   Irregular rate and rhythm, no murmur   Abdomen:  soft, non-tender, no masses   Extremities:  warm, well-perfused, pulses not palpable, bilateral LE edema  Rectal/GU  Deferred  Neuro:   Grossly non-focal and symmetrical throughout  Skin:   Clean and dry, no rashes, no breakdown  Diagnostic Tests:   Cardiac Catheterization Procedure Note  Name: Marcus [name]  MRN: [id number]  DOB: 1933/07/31  Procedure: Right Heart Cath, Left Heart Cath, Selective Coronary Angiography, LV angiography  Indication: Elevated troponin, progressive angina, atrial fib with RVR. This 78 year old gentleman has a history of presumed coronary artery disease based on anginal symptoms and abnormal stress test in the past. He has refused cardiac catheterization up until  now. He presented this hospital admission with atrial fibrillation and worsening chest pain. He ruled in for a non-ST elevation infarction with an elevated troponin.  Procedural Details: The right groin was prepped, draped, and anesthetized with 1% lidocaine. Using the modified Seldinger technique a 5 French sheath was placed in the right femoral artery and a 7 French sheath was placed in the right femoral vein. A Swan-Ganz catheter was used for the right heart catheterization. Standard protocol was followed for recording of right heart pressures and sampling of oxygen saturations. Fick cardiac output was calculated. Standard Judkins catheters were used for selective coronary angiography and left ventriculography. There were no immediate procedural complications. The patient was transferred to the post catheterization recovery area for further monitoring.  Procedural Findings:  Hemodynamics  RA 9  RV 30/9  PA 34/13 with a mean of 24  PCWP  LV 112/9  AO 109/71 with a mean of 87  Oxygen saturations:  PA 59%  AO 99%  Cardiac Output (Fick) 4.9  Cardiac Index (Fick) 2.0  Coronary angiography:  Coronary dominance: right  Left mainstem: Widely patent with mild irregularity and nonobstructive distal left main stenosis.  Left anterior descending (LAD): The LAD is diffusely irregular. The mid LAD beyond a large first diagonal branch has a severe 95-99% stenosis with an ulcerated area on the distal portion of the lesion. Beyond that the LAD is an excellent target vessel with diffuse irregularity but is a fairly large caliber vessel that reaches the left ventricular apex.  Left circumflex (LCx): The left circumflex is patent. The vessel has diffuse irregularity with nonobstructive stenosis in the range of 50% throughout the midportion of the circumflex. It gives off a large obtuse marginal branch and a second OM distribution.  Right coronary artery (RCA): The right coronary artery is dominant. The vessel has  severe proximal stenosis of 75%. Just after the origin  of the acute marginal branch there is subtotal occlusion of the right coronary artery with 99% stenosis. The vessel has severe diffuse disease beyond that with TIMI 2 antegrade flow. The PDA and posterolateral branches are patent.  Left ventriculography: Left ventricular systolic function is normal, LVEF is estimated at 55-65%, there is no significant mitral regurgitation  Final Conclusions:  1. Severe mid LAD stenosis  2. Severe diffuse mid RCA stenosis with subtotal occlusion in the mid vessel  3. Mild to moderate nonobstructive left circumflex stenosis  4. Preserved left ventricular function     Impression:  Severe two-vessel coronary artery disease with preserved left ventricular function. The patient presents with unstable angina in the setting of new onset persistent atrial fibrillation. Based upon coronary anatomy percutaneous coronary intervention would be feasible. However, I agree that surgical revascularization would probably provide better long-term result both in terms of symptomatic relief and improved long-term survival. Concomitant Maze procedure could be considered at the time of surgery. Overall the patient appears to be a reasonably good candidate for surgical revascularization.  Plan:  I reviewed at length the indications, risks, and potential benefits of coronary artery bypass grafting with the patient and his entire family this afternoon. Alternative treatment strategies been discussed in detail including a detailed comparison between percutaneous coronary intervention and coronary artery bypass grafting. The possibility of concomitant Maze procedure was discussed. All of their questions been addressed. The patient is interested in proceeding with surgery in the near future. It is possible that either myself or Dr Donata Clay could schedule surgery for Tuesday. We will followup on Monday to make final plans. I would favor  starting the patient on amiodarone preoperatively.    Salvatore Decent. Cornelius Moras, MD 11/17/2011 4:30 PM

## 2011-11-17 NOTE — Progress Notes (Signed)
ANTICOAGULATION CONSULT NOTE - Follow Up Consult  Pharmacy Consult for heparin Indication: Afib, CP, s/p cath awaiting possible OHS  No Known Allergies  Patient Measurements: Height: 5\' 11"  (180.3 cm) Weight: 280 lb 9.6 oz (127.279 kg) (scale c) IBW/kg (Calculated) : 75.3  Heparin Dosing Weight: 92kg  Vital Signs: Temp: 98.2 F (36.8 C) (03/23 0525) BP: 142/82 mmHg (03/23 0544) Pulse Rate: 85  (03/23 0525)  Labs:  Marcus [name] 11/17/11 0625 11/16/11 0412 11/15/11 2228 11/15/11 1720 11/15/11 0856  HGB 12.4* 12.3* -- -- --  HCT 36.8* 36.4* -- -- 39.7  PLT 167 172 -- -- 185  APTT -- -- -- -- --  LABPROT -- 15.1 -- -- --  INR -- 1.17 -- -- --  HEPARINUNFRC 0.50 0.75* 0.64 -- --  CREATININE 0.99 0.99 -- -- 0.83  CKTOTAL -- 83 96 100 --  CKMB -- 2.7 3.4 4.5* --  TROPONINI -- 0.62* 0.97* 1.08* --   Estimated Creatinine Clearance: 84.9 ml/min (by C-G formula based on Cr of 0.99).  Assessment: 78 yo M admitted with complaints of CP and found have be in afib w/ RVR. Pt started on IV heparin. Pt s/p cath with 2v disease, awaiting OHS consult.  Heparin resumed last PM 8 hours after sheath removal.  Heparin level this AM is therapeutic on 1550 units/hr.    Goal of Therapy:  Heparin level 0.3-0.7 units/ml   Plan:  1. Continue heparin at 1550units/hr.   2.  Continue daily heparin level and CBC 3. F/u surgery plans   [employer], Pharm.D., BCPS Clinical Pharmacist Pager [phone] 11/17/2011 10:56 AM

## 2011-11-17 NOTE — Progress Notes (Addendum)
VASCULAR LAB PRELIMINARY  PRELIMINARY  PRELIMINARY  PRELIMINARY  Pre-op Cardiac Surgery  Carotid Findings:  Bilateral:  No evidence of hemodynamically significant internal carotid artery stenosis.   Vertebral artery flow is antegrade.      Upper Extremity Right Left  Brachial Pressures 101 tri 106 tri  Radial Waveforms tri tri  Ulnar Waveforms tri tri  Palmar Arch (Allen's Test) obliterates with radial compression. Normal with ulnar comression obliterates with radial compression. Normal with ulnar comression   Findings: Bilateral:  Doppler obliterates with radial compression; normal with ulnar compression.     Lower  Extremity Right Left  Dorsalis Pedis    Anterior Tibial    Posterior Tibial    Ankle/Brachial Indices      Findings:  Bilateral palpable pulses.    Terance Hart, RVT 11/17/2011, 2:00 PM 11/19/2011 2:11 PM

## 2011-11-18 ENCOUNTER — Inpatient Hospital Stay (HOSPITAL_COMMUNITY): Payer: Medicare Other

## 2011-11-18 DIAGNOSIS — I059 Rheumatic mitral valve disease, unspecified: Secondary | ICD-10-CM

## 2011-11-18 LAB — HEPARIN LEVEL (UNFRACTIONATED): Heparin Unfractionated: 0.47 IU/mL (ref 0.30–0.70)

## 2011-11-18 LAB — BLOOD GAS, ARTERIAL
Acid-Base Excess: 3 mmol/L — ABNORMAL HIGH (ref 0.0–2.0)
Bicarbonate: 27.3 mEq/L — ABNORMAL HIGH (ref 20.0–24.0)
Drawn by: 34779
TCO2: 28.7 mmol/L (ref 0–100)
pCO2 arterial: 43.8 mmHg (ref 35.0–45.0)
pO2, Arterial: 65 mmHg — ABNORMAL LOW (ref 80.0–100.0)

## 2011-11-18 LAB — LIPID PANEL
Cholesterol: 99 mg/dL (ref 0–200)
HDL: 38 mg/dL — ABNORMAL LOW (ref >39)
Total CHOL/HDL Ratio: 2.6 RATIO
Triglycerides: 101 mg/dL (ref <150)
VLDL: 20 mg/dL (ref 0–40)

## 2011-11-18 LAB — COMPREHENSIVE METABOLIC PANEL
ALT: 9 U/L (ref 0–53)
AST: 16 U/L (ref 0–37)
Albumin: 2.9 g/dL — ABNORMAL LOW (ref 3.5–5.2)
Alkaline Phosphatase: 74 U/L (ref 39–117)
Chloride: 104 mEq/L (ref 96–112)
Potassium: 3.6 mEq/L (ref 3.5–5.1)
Total Bilirubin: 0.7 mg/dL (ref 0.3–1.2)

## 2011-11-18 LAB — CBC
Platelets: 165 10*3/uL (ref 150–400)
RBC: 4.03 MIL/uL — ABNORMAL LOW (ref 4.22–5.81)
RDW: 13.9 % (ref 11.5–15.5)
WBC: 5.3 10*3/uL (ref 4.0–10.5)

## 2011-11-18 LAB — URINE MICROSCOPIC-ADD ON

## 2011-11-18 LAB — URINALYSIS, ROUTINE W REFLEX MICROSCOPIC
Glucose, UA: NEGATIVE mg/dL
Ketones, ur: NEGATIVE mg/dL
Nitrite: NEGATIVE
Specific Gravity, Urine: 1.014 (ref 1.005–1.030)
pH: 5.5 (ref 5.0–8.0)

## 2011-11-18 LAB — PROTIME-INR
INR: 1.15 (ref 0.00–1.49)
Prothrombin Time: 14.9 seconds (ref 11.6–15.2)

## 2011-11-18 LAB — APTT: aPTT: 102 seconds — ABNORMAL HIGH (ref 24–37)

## 2011-11-18 MED ORDER — AMIODARONE HCL 200 MG PO TABS
400.0000 mg | ORAL_TABLET | Freq: Two times a day (BID) | ORAL | Status: DC
  Administered 2011-11-18 – 2011-11-30 (×23): 400 mg via ORAL
  Filled 2011-11-18 (×27): qty 2

## 2011-11-18 NOTE — Progress Notes (Signed)
Subjective:  Patient is awaiting CABG.  No chest pain. Remains in atrial fib. Will start preop loading with amiodarone as requested by Dr. Cornelius Moras.  Objective:  Vital Signs in the last 24 hours: Temp:  [98.1 F (36.7 C)-98.8 F (37.1 C)] 98.3 F (36.8 C) (03/24 0603) Pulse Rate:  [56-79] 70  (03/24 0603) Resp:  [18-20] 20  (03/24 0603) BP: (109-146)/(53-81) 146/81 mmHg (03/24 0603) SpO2:  [92 %-98 %] 98 % (03/24 0603) Weight:  [127.461 kg (281 lb)] 127.461 kg (281 lb) (03/24 0603)  Intake/Output from previous day: 03/23 0701 - 03/24 0700 In: 1705.3 [P.O.:1080; I.V.:625.3] Out: 2400 [Urine:2400] Intake/Output from this shift:       . allopurinol  300 mg Oral Daily  . amiodarone  400 mg Oral BID  . aspirin      . aspirin EC  325 mg Oral Daily  . atorvastatin  80 mg Oral q1800  . doxazosin  8 mg Oral Daily  . isosorbide dinitrate  20 mg Oral BID  . lisinopril  5 mg Oral Daily  . metoprolol  50 mg Oral BID  . omega-3 acid ethyl esters  2 g Oral BID  . sodium chloride  3 mL Intravenous Q12H      . diltiazem (CARDIZEM) infusion 5 mg/hr (11/17/11 1649)  . heparin 1,550 Units/hr (11/18/11 0234)    Physical Exam: The patient appears to be in no distress.  Head and neck exam reveals that the pupils are equal and reactive.  The extraocular movements are full.  There is no scleral icterus.  Mouth and pharynx are benign.  No lymphadenopathy.  No carotid bruits.  The jugular venous pressure is normal.  Thyroid is not enlarged or tender.  Chest is clear to percussion and auscultation.  No rales or rhonchi.  Expansion of the chest is symmetrical.  Heart reveals no abnormal lift or heave.  First and second heart sounds are normal.  There is no murmur gallop rub or click.  The abdomen is soft and nontender.  Bowel sounds are normoactive.  There is no hepatosplenomegaly or mass.  There are no abdominal bruits.  No groin hematoma.   Extremities reveal no phlebitis or edema.  Pedal  pulses are good.  There is no cyanosis or clubbing.  Neurologic exam is normal strength and no lateralizing weakness.  No sensory deficits.  Integument reveals no rash  Lab Results:  Basename 11/18/11 0535 11/17/11 0625  WBC 5.3 5.4  HGB 12.3* 12.4*  PLT 165 167    Basename 11/18/11 0535 11/17/11 0625  NA 140 140  K 3.6 3.6  CL 104 106  CO2 27 27  GLUCOSE 90 91  BUN 11 8  CREATININE 1.08 0.99    Basename 11/16/11 0412 11/15/11 2228  TROPONINI 0.62* 0.97*   Hepatic Function Panel  Basename 11/18/11 0535  PROT 5.9*  ALBUMIN 2.9*  AST 16  ALT 9  ALKPHOS 74  BILITOT 0.7  BILIDIR --  IBILI --    Basename 11/18/11 0535  CHOL 99   No results found for this basename: PROTIME in the last 72 hours  Imaging: Imaging results have been reviewed  Cardiac Studies: See cath report. Telemetry shows atrial fib. Assessment/Plan:  Patient Active Hospital Problem List: Atrial fibrillation with RVR (11/15/2011)   Assessment: Rate controlled   Plan: Continue IV heparin Pure hypercholesterolemia (10/18/2009)   Assessment: Continue lipitor  HTN (hypertension) (11/15/2011)   Assessment: Stable   Plan: Continue metoprolol. Add ACEi Chest  pain (11/15/2011)   Assessment: No pain overnight   Plan: CABG probably on Tuesday. Start loading with amio now. Check baseline TSH in am   LOS: 3 days    Cassell Clement 11/18/2011, 11:23 AM

## 2011-11-18 NOTE — Progress Notes (Signed)
  Echocardiogram 2D Echocardiogram has been performed.  Maize Brittingham, Real Cons 11/18/2011, 8:41 AM

## 2011-11-18 NOTE — Progress Notes (Signed)
ANTICOAGULATION CONSULT NOTE - Follow Up Consult  Pharmacy Consult for heparin Indication: Afib, CP, s/p cath awaiting possible OHS  No Known Allergies  Patient Measurements: Height: 5\' 11"  (180.3 cm) Weight: 281 lb (127.461 kg) (c scale) IBW/kg (Calculated) : 75.3  Heparin Dosing Weight: 92kg  Vital Signs: Temp: 98.3 F (36.8 C) (03/24 0603) Temp src: Oral (03/24 0603) BP: 146/81 mmHg (03/24 0603) Pulse Rate: 70  (03/24 0603)  Labs:  Alvira Philips 11/18/11 0535 11/17/11 4782 11/16/11 0412 11/15/11 2228 11/15/11 1720  HGB 12.3* 12.4* -- -- --  HCT 37.3* 36.8* 36.4* -- --  PLT 165 167 172 -- --  APTT 102* -- -- -- --  LABPROT 14.9 -- 15.1 -- --  INR 1.15 -- 1.17 -- --  HEPARINUNFRC 0.47 0.50 0.75* -- --  CREATININE 1.08 0.99 0.99 -- --  CKTOTAL -- -- 83 96 100  CKMB -- -- 2.7 3.4 4.5*  TROPONINI -- -- 0.62* 0.97* 1.08*   Estimated Creatinine Clearance: 77.9 ml/min (by C-G formula based on Cr of 1.08).  Assessment: 78 yo M admitted with complaints of CP and found have be in afib w/ RVR. Pt started on IV heparin. Pt s/p cath with 2v disease.  Family considering OHS vs cath/PCI decision.  Heparin level remains therapeutic on 1550 units/hr.    Goal of Therapy:  Heparin level 0.3-0.7 units/ml   Plan:  1. Continue heparin at 1550units/hr.   2. Continue daily heparin level and CBC 3. F/u surgery plans   [employer], Pharm.D., BCPS Clinical Pharmacist Pager [phone] 11/18/2011 10:39 AM

## 2011-11-19 ENCOUNTER — Inpatient Hospital Stay (HOSPITAL_COMMUNITY): Payer: Medicare Other

## 2011-11-19 ENCOUNTER — Other Ambulatory Visit: Payer: Self-pay

## 2011-11-19 DIAGNOSIS — I2510 Atherosclerotic heart disease of native coronary artery without angina pectoris: Secondary | ICD-10-CM

## 2011-11-19 DIAGNOSIS — E7800 Pure hypercholesterolemia, unspecified: Secondary | ICD-10-CM

## 2011-11-19 DIAGNOSIS — Z01810 Encounter for preprocedural cardiovascular examination: Secondary | ICD-10-CM

## 2011-11-19 LAB — TSH: TSH: 5.326 u[IU]/mL — ABNORMAL HIGH (ref 0.350–4.500)

## 2011-11-19 LAB — HEPARIN LEVEL (UNFRACTIONATED): Heparin Unfractionated: 0.5 IU/mL (ref 0.30–0.70)

## 2011-11-19 LAB — PREPARE RBC (CROSSMATCH)

## 2011-11-19 MED ORDER — ALBUTEROL SULFATE (5 MG/ML) 0.5% IN NEBU
2.5000 mg | INHALATION_SOLUTION | Freq: Once | RESPIRATORY_TRACT | Status: AC
  Administered 2011-11-19: 2.5 mg via RESPIRATORY_TRACT

## 2011-11-19 MED ORDER — CHLORHEXIDINE GLUCONATE 4 % EX LIQD
60.0000 mL | Freq: Once | CUTANEOUS | Status: DC
  Filled 2011-11-19 (×2): qty 60

## 2011-11-19 MED ORDER — DEXTROSE 5 % IV SOLN
30.0000 ug/min | INTRAVENOUS | Status: DC
  Filled 2011-11-19: qty 2

## 2011-11-19 MED ORDER — ATORVASTATIN CALCIUM 20 MG PO TABS
20.0000 mg | ORAL_TABLET | Freq: Every day | ORAL | Status: DC
  Administered 2011-11-19 – 2011-11-29 (×9): 20 mg via ORAL
  Filled 2011-11-19 (×12): qty 1

## 2011-11-19 MED ORDER — METOPROLOL TARTRATE 12.5 MG HALF TABLET
12.5000 mg | ORAL_TABLET | Freq: Once | ORAL | Status: AC
  Administered 2011-11-20: 12.5 mg via ORAL
  Filled 2011-11-19: qty 1

## 2011-11-19 MED ORDER — SODIUM CHLORIDE 0.9 % IV SOLN
INTRAVENOUS | Status: AC
  Administered 2011-11-20: 69.8 mL/h via INTRAVENOUS
  Filled 2011-11-19: qty 40

## 2011-11-19 MED ORDER — DEXTROSE 5 % IV SOLN
1.5000 g | INTRAVENOUS | Status: AC
  Administered 2011-11-20: 1.5 g via INTRAVENOUS
  Administered 2011-11-20: .75 g via INTRAVENOUS
  Filled 2011-11-19: qty 1.5

## 2011-11-19 MED ORDER — EPINEPHRINE HCL 1 MG/ML IJ SOLN
0.5000 ug/min | INTRAVENOUS | Status: DC
  Filled 2011-11-19: qty 4

## 2011-11-19 MED ORDER — BISACODYL 5 MG PO TBEC
5.0000 mg | DELAYED_RELEASE_TABLET | Freq: Once | ORAL | Status: AC
  Administered 2011-11-19: 5 mg via ORAL
  Filled 2011-11-19: qty 1

## 2011-11-19 MED ORDER — DIAZEPAM 5 MG PO TABS: 5.0000 mg | ORAL_TABLET | ORAL | Status: DC | PRN

## 2011-11-19 MED ORDER — TEMAZEPAM 15 MG PO CAPS: 15.0000 mg | ORAL_CAPSULE | Freq: Once | ORAL | Status: AC | PRN

## 2011-11-19 MED ORDER — DEXTROSE 5 % IV SOLN
750.0000 mg | INTRAVENOUS | Status: DC
  Filled 2011-11-19: qty 750

## 2011-11-19 MED ORDER — CHLORHEXIDINE GLUCONATE 4 % EX LIQD
60.0000 mL | Freq: Once | CUTANEOUS | Status: AC
  Administered 2011-11-20: 4 via TOPICAL
  Filled 2011-11-19: qty 60

## 2011-11-19 MED ORDER — POTASSIUM CHLORIDE 2 MEQ/ML IV SOLN
80.0000 meq | INTRAVENOUS | Status: DC
  Filled 2011-11-19: qty 40

## 2011-11-19 MED ORDER — SODIUM CHLORIDE 0.9 % IV SOLN
INTRAVENOUS | Status: AC
  Administered 2011-11-20: 1.4 [IU]/h via INTRAVENOUS
  Filled 2011-11-19: qty 1

## 2011-11-19 MED ORDER — VANCOMYCIN HCL 1000 MG IV SOLR
1500.0000 mg | INTRAVENOUS | Status: AC
  Administered 2011-11-20: 1500 mg via INTRAVENOUS
  Filled 2011-11-19: qty 1500

## 2011-11-19 MED ORDER — DEXMEDETOMIDINE HCL 100 MCG/ML IV SOLN
0.1000 ug/kg/h | INTRAVENOUS | Status: AC
  Administered 2011-11-20: .3 ug/kg/h via INTRAVENOUS
  Filled 2011-11-19: qty 4

## 2011-11-19 MED ORDER — NITROGLYCERIN IN D5W 200-5 MCG/ML-% IV SOLN
2.0000 ug/min | INTRAVENOUS | Status: AC
  Administered 2011-11-20: 10 ug/min via INTRAVENOUS
  Filled 2011-11-19: qty 250

## 2011-11-19 MED ORDER — DOPAMINE-DEXTROSE 3.2-5 MG/ML-% IV SOLN
2.0000 ug/kg/min | INTRAVENOUS | Status: AC
  Administered 2011-11-20: 3 ug/kg/min via INTRAVENOUS
  Filled 2011-11-19: qty 250

## 2011-11-19 MED ORDER — VERAPAMIL HCL 2.5 MG/ML IV SOLN
INTRAVENOUS | Status: AC
  Administered 2011-11-20: 09:00:00
  Filled 2011-11-19 (×2): qty 2.5

## 2011-11-19 MED ORDER — MAGNESIUM SULFATE 50 % IJ SOLN
40.0000 meq | INTRAMUSCULAR | Status: DC
  Filled 2011-11-19: qty 10

## 2011-11-19 MED ORDER — IOHEXOL 300 MG/ML  SOLN
80.0000 mL | Freq: Once | INTRAMUSCULAR | Status: AC | PRN
  Administered 2011-11-19: 80 mL via INTRAVENOUS

## 2011-11-19 MED ORDER — VANCOMYCIN HCL 1000 MG IV SOLR
1500.0000 mg | INTRAVENOUS | Status: DC
  Filled 2011-11-19: qty 1500

## 2011-11-19 NOTE — Progress Notes (Signed)
Subjective:  Currently in PFT lab where I saw him.  No chest pain.  Awaiting surgery.    Objective:  Vital Signs in the last 24 hours: Temp:  [97.9 F (36.6 C)-98.3 F (36.8 C)] 98.3 F (36.8 C) (03/25 0532) Pulse Rate:  [63-68] 63  (03/25 0532) Resp:  [16-18] 18  (03/25 0532) BP: (101-133)/(68-78) 129/78 mmHg (03/25 0532) SpO2:  [96 %-99 %] 96 % (03/25 0532) Weight:  [282 lb (127.914 kg)] 282 lb (127.914 kg) (03/25 0532)  Intake/Output from previous day: 03/24 0701 - 03/25 0700 In: 841 [P.O.:480; I.V.:361] Out: 1400 [Urine:1400]   Physical Exam: General: Looks a bit tired.  Unshaved at present.   Head:  Normocephalic and atraumatic. Lungs: Clear to auscultation. Heart: irregularly, irregular rhythm without murmur.    Pulses: Pulses normal in all 4 extremities. Extremities: No clubbing or cyanosis. No edema. Neurologic: Alert and oriented x 3.    Lab Results:  Basename 11/18/11 0535 11/17/11 0625  WBC 5.3 5.4  HGB 12.3* 12.4*  PLT 165 167    Basename 11/18/11 0535 11/17/11 0625  NA 140 140  K 3.6 3.6  CL 104 106  CO2 27 27  GLUCOSE 90 91  BUN 11 8  CREATININE 1.08 0.99   No results found for this basename: TROPONINI:2,CK,MB:2 in the last 72 hours Hepatic Function Panel  Basename 11/18/11 0535  PROT 5.9*  ALBUMIN 2.9*  AST 16  ALT 9  ALKPHOS 74  BILITOT 0.7  BILIDIR --  IBILI --    Basename 11/18/11 0535  CHOL 99   No results found for this basename: PROTIME in the last 72 hours  Imaging: Dg Chest 2 View  11/18/2011  *RADIOLOGY REPORT*  Clinical Data: Preoperative examination (heart surgery)  CHEST - 2 VIEW  Comparison: 11/15/2011; 01/04/2011  Findings:  Grossly unchanged enlarged cardiac silhouette and mediastinal contours with persistent obscuration of the right heart border secondary to elevation of the right hemidiaphragm and right perihilar heterogeneous opacities. Grossly unchanged left basilar heterogeneous opacities.  No pleural effusion or  pneumothorax. Multilevel thoracic spine flowing osteophytosis with preservation of disc spaces, suggestive of DISH.  Bilateral glenohumeral joint degenerative change.  No acute osseous abnormalities.  IMPRESSION: Stable findings of an elevated right hemidiaphragm with right perihilar heterogeneous opacities.  While these findings are stable to the most remote examination performed 12/2010, they are abnormal.  If more remote examinations are not available, further evaluation may be obtained with a chest CT as clinically indicated.  This was made a call report.  Original Report Authenticated By: Waynard Reeds, M.D.    EKG:  Telemetry reviewed.  Rate varies, got fast last pm, now on dilt drip at 5 mg/hr with controlled rate.    Assessment/Plan:  Patient Active Hospital Problem List: Atrial fibrillation with RVR (11/15/2011)   Assessment: currently on an amio load with moderate dose of metoprolol.   Plan: watch rate with load.  dilt may need to be tapered off.  Pure hypercholesterolemia (10/18/2009)   Assessment: will need statin   Plan: start atorvastatin at 20mg  day HTN (hypertension) (11/15/2011)   Assessment: controlled   Plan: continue meds Chest pain (11/15/2011)   Assessment: awaiting CABG tomorrow   Plan: see orders.        Shawnie Pons, MD, [facility], FSCAI 11/19/2011, 9:14 AM

## 2011-11-19 NOTE — Progress Notes (Signed)
   CARE MANAGEMENT NOTE 11/19/2011  Patient:  Marcus [name], Marcus [name]   Account Number:  [id number]  Date Initiated:  11/19/2011  Documentation initiated by:  GRAVES-BIGELOW,Kyri Shader  Subjective/Objective Assessment:   Pt admitted with cp and a fib with rvr. Plan for CABG with maze procedure 11-20-11.     Action/Plan:   Anticipated DC Date:  11/25/2011   Anticipated DC Plan:  HOME W HOME HEALTH SERVICES      DC Planning Services  CM consult      Choice offered to / List presented to:             Status of service:  In process, will continue to follow Medicare Important Message given?   (If response is "NO", the following Medicare IM given date fields will be blank) Date Medicare IM given:   Date Additional Medicare IM given:    Discharge Disposition:    Per UR Regulation:    If discussed at Long Length of Stay Meetings, dates discussed:    Comments:  11-19-11 1443 Tomi Bamberger, RN,BSN [phone] CM will continue to monitor for d/c disposition needs.

## 2011-11-19 NOTE — Progress Notes (Signed)
ANTICOAGULATION CONSULT NOTE - Follow Up Consult  Pharmacy Consult for heparin Indication: Afib, CP, s/p cath awaiting possible OHS  No Known Allergies  Patient Measurements: Height: 5\' 11"  (180.3 cm) Weight: 282 lb (127.914 kg) (c scale) IBW/kg (Calculated) : 75.3  Heparin Dosing Weight: 92kg  Vital Signs: Temp: 98.3 F (36.8 C) (03/25 0532) Temp src: Oral (03/25 0532) BP: 129/78 mmHg (03/25 0532) Pulse Rate: 63  (03/25 0532)  Labs:  Marcus Marcus [name] 11/19/11 1610 11/18/11 0535 11/17/11 0625  HGB -- 12.3* 12.4*  HCT -- 37.3* 36.8*  PLT -- 165 167  APTT -- 102* --  LABPROT -- 14.9 --  INR -- 1.15 --  HEPARINUNFRC 0.50 0.47 0.50  CREATININE -- 1.08 0.99  CKTOTAL -- -- --  CKMB -- -- --  TROPONINI -- -- --   Estimated Creatinine Clearance: 78 ml/min (by C-G formula based on Cr of 1.08).  Assessment: 78 yo Marcus [name] admitted with complaints of CP and found have be in afib w/ RVR. Pt started on IV heparin. Pt s/p cath with 2v disease.  Pt. to have Maze procedure tomorrow.  Heparin level remains therapeutic on 1550 units/hr.    Goal of Therapy:  Heparin level 0.3-0.7 units/ml   Plan:  1. Continue heparin at 1550units/hr.   2. Continue daily heparin level and CBC 3. Surgery tomorrow   Marcus Marcus [name], [place].D., MS Clinical Pharmacist Pager [phone] 11/19/2011 11:11 AM

## 2011-11-19 NOTE — Progress Notes (Signed)
Problems 1 severe multivessel coronary disease with unstable angina 2 paroxysmal atrial fibrillation with rapid ventricular response 3 obesity BMI greater than 35.0 4 hypertension 5 gout 6 no known drug allergies  Exam Heart rate 60 per minute on IV Cardizem drip and oral amiodarone, blood pressure 110/70 temperature 36.5 weight 282 pounds Gen. appearance obese Caucasian male somewhat disheveled but appropriate in affect Chest breath sounds clear Cardiac atrial fibrillation no murmur or gallop Abdomen soft obese Extremities mild edema nonpalpable pedal pulses Neurologic no focal motor weakness  Laboratory data Carotid plexus ultrasound shows no significant carotid artery disease PFTs and been completed and are pending, ABGs are within normal limits Chest x-ray shows elevation of the right hemidiaphragm with right hilar fullness, CT scan of the chest pending to rule out neoplasm Nondiabetic, hemoglobin A1c pending  Plan--multivessel coronary surgical reactivation with combined maze procedure tomorrow. Procedure discussed with patient including indications benefits and risks. He understands the requirement for a permanent pacemaker after maze is approximately 5-8%. He understands the risk of bleeding and blood transfusion requirement and walks up blood transfusion. We'll followup CT scan of the chest to rule out neoplasm as a cause for elevation of the right hemidiaphragm and plan on CABG with maze tomorrow if CT scan is satisfactory.

## 2011-11-20 ENCOUNTER — Encounter (HOSPITAL_COMMUNITY): Payer: Self-pay | Admitting: Anesthesiology

## 2011-11-20 ENCOUNTER — Encounter (HOSPITAL_COMMUNITY)
Admission: EM | Disposition: A | Discharge status: Home Health | Payer: Self-pay | Source: Ambulatory Visit | Attending: Cardiothoracic Surgery

## 2011-11-20 ENCOUNTER — Inpatient Hospital Stay (HOSPITAL_COMMUNITY): Payer: Medicare Other

## 2011-11-20 ENCOUNTER — Inpatient Hospital Stay (HOSPITAL_COMMUNITY): Payer: Medicare Other | Admitting: Anesthesiology

## 2011-11-20 DIAGNOSIS — I2510 Atherosclerotic heart disease of native coronary artery without angina pectoris: Secondary | ICD-10-CM

## 2011-11-20 DIAGNOSIS — I4891 Unspecified atrial fibrillation: Secondary | ICD-10-CM

## 2011-11-20 HISTORY — PX: MAZE: SHX5063

## 2011-11-20 HISTORY — PX: CORONARY ARTERY BYPASS GRAFT: SHX141

## 2011-11-20 LAB — CBC
HCT: 34.3 % — ABNORMAL LOW (ref 39.0–52.0)
HCT: 36 % — ABNORMAL LOW (ref 39.0–52.0)
HCT: 36.2 % — ABNORMAL LOW (ref 39.0–52.0)
Hemoglobin: 11.4 g/dL — ABNORMAL LOW (ref 13.0–17.0)
Hemoglobin: 12.4 g/dL — ABNORMAL LOW (ref 13.0–17.0)
MCH: 30.6 pg (ref 26.0–34.0)
MCH: 30.9 pg (ref 26.0–34.0)
MCHC: 33.2 g/dL (ref 30.0–36.0)
MCHC: 33.9 g/dL (ref 30.0–36.0)
MCHC: 34.3 g/dL (ref 30.0–36.0)
MCV: 92.2 fL (ref 78.0–100.0)
MCV: 90.3 fL (ref 78.0–100.0)
Platelets: 159 10*3/uL (ref 150–400)
Platelets: 128 10*3/uL — ABNORMAL LOW (ref 150–400)
RBC: 3.72 MIL/uL — ABNORMAL LOW (ref 4.22–5.81)
RBC: 4.01 MIL/uL — ABNORMAL LOW (ref 4.22–5.81)
RDW: 14.1 % (ref 11.5–15.5)
RDW: 14 % (ref 11.5–15.5)
RDW: 13.7 % (ref 11.5–15.5)
WBC: 5.9 10*3/uL (ref 4.0–10.5)
WBC: 8.5 10*3/uL (ref 4.0–10.5)
WBC: 8.2 10*3/uL (ref 4.0–10.5)

## 2011-11-20 LAB — POCT I-STAT 4, (NA,K, GLUC, HGB,HCT)
Glucose, Bld: 116 mg/dL — ABNORMAL HIGH (ref 70–99)
HCT: 31 % — ABNORMAL LOW (ref 39.0–52.0)
HCT: 27 % — ABNORMAL LOW (ref 39.0–52.0)
HCT: 25 % — ABNORMAL LOW (ref 39.0–52.0)
HCT: 36 % — ABNORMAL LOW (ref 39.0–52.0)
Hemoglobin: 10.9 g/dL — ABNORMAL LOW (ref 13.0–17.0)
Hemoglobin: 10.5 g/dL — ABNORMAL LOW (ref 13.0–17.0)
Hemoglobin: 8.5 g/dL — ABNORMAL LOW (ref 13.0–17.0)
Hemoglobin: 12.2 g/dL — ABNORMAL LOW (ref 13.0–17.0)
Potassium: 3.5 mEq/L (ref 3.5–5.1)
Potassium: 5 mEq/L (ref 3.5–5.1)
Potassium: 4.4 mEq/L (ref 3.5–5.1)
Potassium: 4.2 mEq/L (ref 3.5–5.1)
Sodium: 139 mEq/L (ref 135–145)
Sodium: 135 mEq/L (ref 135–145)
Sodium: 137 mEq/L (ref 135–145)

## 2011-11-20 LAB — HEMOGLOBIN A1C
Hgb A1c MFr Bld: 5.8 % — ABNORMAL HIGH (ref <5.7)
Mean Plasma Glucose: 120 mg/dL — ABNORMAL HIGH (ref <117)

## 2011-11-20 LAB — BASIC METABOLIC PANEL
BUN: 12 mg/dL (ref 6–23)
CO2: 29 mEq/L (ref 19–32)
Calcium: 8.8 mg/dL (ref 8.4–10.5)
Chloride: 103 mEq/L (ref 96–112)
Creatinine, Ser: 1.09 mg/dL (ref 0.50–1.35)
GFR calc Af Amer: 74 mL/min — ABNORMAL LOW (ref >90)
GFR calc non Af Amer: 63 mL/min — ABNORMAL LOW (ref >90)
Glucose, Bld: 90 mg/dL (ref 70–99)
Potassium: 3.5 mEq/L (ref 3.5–5.1)
Sodium: 138 mEq/L (ref 135–145)

## 2011-11-20 LAB — POCT I-STAT 3, ART BLOOD GAS (G3+)
Bicarbonate: 25.6 mEq/L — ABNORMAL HIGH (ref 20.0–24.0)
Bicarbonate: 26.9 mEq/L — ABNORMAL HIGH (ref 20.0–24.0)
O2 Saturation: 100 %
O2 Saturation: 100 %
O2 Saturation: 98 %
O2 Saturation: 90 %
Patient temperature: 35.1
Patient temperature: 37.2
TCO2: 30 mmol/L (ref 0–100)
TCO2: 26 mmol/L (ref 0–100)
pCO2 arterial: 46.9 mmHg — ABNORMAL HIGH (ref 35.0–45.0)
pCO2 arterial: 28.9 mmHg — ABNORMAL LOW (ref 35.0–45.0)
pCO2 arterial: 55.1 mmHg — ABNORMAL HIGH (ref 35.0–45.0)
pH, Arterial: 7.392 (ref 7.350–7.450)
pH, Arterial: 7.555 — ABNORMAL HIGH (ref 7.350–7.450)
pO2, Arterial: 316 mmHg — ABNORMAL HIGH (ref 80.0–100.0)
pO2, Arterial: 115 mmHg — ABNORMAL HIGH (ref 80.0–100.0)
pO2, Arterial: 66 mmHg — ABNORMAL LOW (ref 80.0–100.0)

## 2011-11-20 LAB — MAGNESIUM: Magnesium: 2.2 mg/dL (ref 1.5–2.5)

## 2011-11-20 LAB — CREATININE, SERUM
Creatinine, Ser: 0.99 mg/dL (ref 0.50–1.35)
GFR calc Af Amer: 89 mL/min — ABNORMAL LOW (ref >90)
GFR calc non Af Amer: 77 mL/min — ABNORMAL LOW (ref >90)

## 2011-11-20 LAB — HEMOGLOBIN AND HEMATOCRIT, BLOOD
HCT: 27.2 % — ABNORMAL LOW (ref 39.0–52.0)
Hemoglobin: 9.3 g/dL — ABNORMAL LOW (ref 13.0–17.0)

## 2011-11-20 LAB — PLATELET COUNT: Platelets: 121 10*3/uL — ABNORMAL LOW (ref 150–400)

## 2011-11-20 LAB — PROTIME-INR
INR: 1.38 (ref 0.00–1.49)
Prothrombin Time: 17.2 seconds — ABNORMAL HIGH (ref 11.6–15.2)

## 2011-11-20 LAB — APTT: aPTT: 31 seconds (ref 24–37)

## 2011-11-20 LAB — SURGICAL PCR SCREEN: Staphylococcus aureus: NEGATIVE

## 2011-11-20 LAB — POCT I-STAT, CHEM 8
BUN: 11 mg/dL (ref 6–23)
Chloride: 105 mEq/L (ref 96–112)
Creatinine, Ser: 1.1 mg/dL (ref 0.50–1.35)
Sodium: 138 mEq/L (ref 135–145)

## 2011-11-20 LAB — HEPARIN LEVEL (UNFRACTIONATED): Heparin Unfractionated: 0.44 IU/mL (ref 0.30–0.70)

## 2011-11-20 SURGERY — CORONARY ARTERY BYPASS GRAFTING (CABG)
Anesthesia: General | Site: Chest | Wound class: Clean

## 2011-11-20 MED ORDER — BISACODYL 5 MG PO TBEC
10.0000 mg | DELAYED_RELEASE_TABLET | Freq: Every day | ORAL | Status: DC
  Administered 2011-11-21 – 2011-11-28 (×8): 10 mg via ORAL
  Filled 2011-11-20 (×2): qty 2
  Filled 2011-11-20: qty 1
  Filled 2011-11-20 (×4): qty 2
  Filled 2011-11-20: qty 1
  Filled 2011-11-20: qty 2

## 2011-11-20 MED ORDER — ALBUMIN HUMAN 5 % IV SOLN
250.0000 mL | INTRAVENOUS | Status: AC | PRN
  Administered 2011-11-20 – 2011-11-21 (×4): 250 mL via INTRAVENOUS
  Filled 2011-11-20 (×2): qty 250

## 2011-11-20 MED ORDER — SODIUM CHLORIDE 0.9 % IV SOLN
0.4000 ug/kg/h | Freq: Once | INTRAVENOUS | Status: AC
  Administered 2011-11-20: 0.7 ug/kg/h via INTRAVENOUS
  Filled 2011-11-20: qty 2

## 2011-11-20 MED ORDER — MORPHINE SULFATE 2 MG/ML IJ SOLN: 1.0000 mg | INTRAMUSCULAR | Status: AC | PRN

## 2011-11-20 MED ORDER — SODIUM CHLORIDE 0.9 % IJ SOLN
OROMUCOSAL | Status: DC | PRN
  Administered 2011-11-20: 09:00:00 via TOPICAL

## 2011-11-20 MED ORDER — SODIUM CHLORIDE 0.9 % IV SOLN
0.1000 ug/kg/h | INTRAVENOUS | Status: DC
  Filled 2011-11-20 (×2): qty 2

## 2011-11-20 MED ORDER — PHENYLEPHRINE HCL 10 MG/ML IJ SOLN
0.0000 ug/min | INTRAVENOUS | Status: DC
  Administered 2011-11-21: 75 ug/min via INTRAVENOUS
  Administered 2011-11-21: 35 ug/min via INTRAVENOUS
  Filled 2011-11-20 (×3): qty 2

## 2011-11-20 MED ORDER — MIDAZOLAM HCL 2 MG/2ML IJ SOLN
INTRAMUSCULAR | Status: AC
  Filled 2011-11-20: qty 2

## 2011-11-20 MED ORDER — GLYCOPYRROLATE 0.2 MG/ML IJ SOLN
INTRAMUSCULAR | Status: DC | PRN
  Administered 2011-11-20 (×3): 0.2 mg via INTRAVENOUS

## 2011-11-20 MED ORDER — POTASSIUM CHLORIDE 10 MEQ/50ML IV SOLN
10.0000 meq | INTRAVENOUS | Status: AC
Start: 2011-11-20 — End: 2011-11-20

## 2011-11-20 MED ORDER — MILRINONE IN DEXTROSE 200-5 MCG/ML-% IV SOLN
0.2000 ug/kg/min | INTRAVENOUS | Status: DC
  Administered 2011-11-20: 0.375 ug/kg/min via INTRAVENOUS
  Administered 2011-11-20: 0.3 ug/kg/min via INTRAVENOUS
  Filled 2011-11-20 (×3): qty 100

## 2011-11-20 MED ORDER — FENTANYL CITRATE 0.05 MG/ML IJ SOLN
INTRAMUSCULAR | Status: DC | PRN
  Administered 2011-11-20: 50 ug via INTRAVENOUS
  Administered 2011-11-20: 150 ug via INTRAVENOUS
  Administered 2011-11-20: 500 ug via INTRAVENOUS
  Administered 2011-11-20: 100 ug via INTRAVENOUS
  Administered 2011-11-20: 450 ug via INTRAVENOUS
  Administered 2011-11-20 (×2): 250 ug via INTRAVENOUS
  Administered 2011-11-20: 150 ug via INTRAVENOUS
  Administered 2011-11-20: 250 ug via INTRAVENOUS
  Administered 2011-11-20: 100 ug via INTRAVENOUS
  Administered 2011-11-20: 250 ug via INTRAVENOUS

## 2011-11-20 MED ORDER — MIDAZOLAM HCL 2 MG/2ML IJ SOLN: 2.0000 mg | INTRAMUSCULAR | Status: DC | PRN

## 2011-11-20 MED ORDER — BISACODYL 10 MG RE SUPP: 10.0000 mg | Freq: Every day | RECTAL | Status: DC

## 2011-11-20 MED ORDER — ASPIRIN EC 325 MG PO TBEC
325.0000 mg | DELAYED_RELEASE_TABLET | Freq: Every day | ORAL | Status: DC
  Administered 2011-11-21 – 2011-11-30 (×10): 325 mg via ORAL
  Filled 2011-11-20 (×10): qty 1

## 2011-11-20 MED ORDER — VECURONIUM BROMIDE 10 MG IV SOLR
INTRAVENOUS | Status: DC | PRN
  Administered 2011-11-20 (×2): 10 mg via INTRAVENOUS
  Administered 2011-11-20: 7 mg via INTRAVENOUS
  Administered 2011-11-20: 3 mg via INTRAVENOUS

## 2011-11-20 MED ORDER — SODIUM CHLORIDE 0.9 % IV SOLN
10.0000 mg | INTRAVENOUS | Status: DC | PRN
  Administered 2011-11-20: 50 ug/min via INTRAVENOUS

## 2011-11-20 MED ORDER — METOPROLOL TARTRATE 25 MG/10 ML ORAL SUSPENSION
12.5000 mg | Freq: Two times a day (BID) | ORAL | Status: DC
  Administered 2011-11-27: 12.5 mg
  Filled 2011-11-20 (×15): qty 5

## 2011-11-20 MED ORDER — VANCOMYCIN HCL 1000 MG IV SOLR
1000.0000 mg | Freq: Once | INTRAVENOUS | Status: AC
  Administered 2011-11-20: 1000 mg via INTRAVENOUS
  Filled 2011-11-20: qty 1000

## 2011-11-20 MED ORDER — OXYCODONE HCL 5 MG PO TABS: 5.0000 mg | ORAL_TABLET | ORAL | Status: DC | PRN

## 2011-11-20 MED ORDER — DOXAZOSIN MESYLATE 4 MG PO TABS
4.0000 mg | ORAL_TABLET | Freq: Every day | ORAL | Status: DC
  Administered 2011-11-22 – 2011-11-30 (×9): 4 mg via ORAL
  Filled 2011-11-20 (×10): qty 1

## 2011-11-20 MED ORDER — METOPROLOL TARTRATE 1 MG/ML IV SOLN
2.5000 mg | INTRAVENOUS | Status: DC | PRN
  Administered 2011-11-23 – 2011-11-27 (×2): 5 mg via INTRAVENOUS
  Filled 2011-11-20 (×2): qty 5

## 2011-11-20 MED ORDER — NITROGLYCERIN IN D5W 200-5 MCG/ML-% IV SOLN: 0.0000 ug/min | INTRAVENOUS | Status: DC

## 2011-11-20 MED ORDER — 0.9 % SODIUM CHLORIDE (POUR BTL) OPTIME
TOPICAL | Status: DC | PRN
  Administered 2011-11-20: 6000 mL

## 2011-11-20 MED ORDER — MILRINONE IN DEXTROSE 200-5 MCG/ML-% IV SOLN
INTRAVENOUS | Status: DC | PRN
  Administered 2011-11-20: 0.375 ug/kg/min via INTRAVENOUS

## 2011-11-20 MED ORDER — MORPHINE SULFATE 4 MG/ML IJ SOLN
INTRAMUSCULAR | Status: AC
  Administered 2011-11-20: 4 mg via INTRAVENOUS
  Filled 2011-11-20: qty 1

## 2011-11-20 MED ORDER — SODIUM CHLORIDE 0.45 % IV SOLN
INTRAVENOUS | Status: DC
  Administered 2011-11-21: 21:00:00 via INTRAVENOUS
  Administered 2011-11-22: 500 mL via INTRAVENOUS

## 2011-11-20 MED ORDER — LIDOCAINE HCL (CARDIAC) 20 MG/ML IV SOLN
INTRAVENOUS | Status: DC | PRN
  Administered 2011-11-20: 10 mg via INTRAVENOUS

## 2011-11-20 MED ORDER — SODIUM CHLORIDE 0.9 % IV SOLN
INTRAVENOUS | Status: DC
  Administered 2011-11-20: 21:00:00 via INTRAVENOUS
  Administered 2011-11-20: 1.7 [IU]/h via INTRAVENOUS
  Filled 2011-11-20 (×2): qty 1

## 2011-11-20 MED ORDER — ACETAMINOPHEN 650 MG RE SUPP
650.0000 mg | RECTAL | Status: AC
  Administered 2011-11-20: 650 mg via RECTAL

## 2011-11-20 MED ORDER — DEXTROSE 5 % IV SOLN
1.5000 g | Freq: Two times a day (BID) | INTRAVENOUS | Status: AC
  Administered 2011-11-21 – 2011-11-22 (×4): 1.5 g via INTRAVENOUS
  Filled 2011-11-20 (×4): qty 1.5

## 2011-11-20 MED ORDER — MORPHINE SULFATE 4 MG/ML IJ SOLN
2.0000 mg | INTRAMUSCULAR | Status: DC | PRN
Start: 2011-11-20 — End: 2011-11-21
  Administered 2011-11-20 (×2): 4 mg via INTRAVENOUS
  Administered 2011-11-21: 2 mg via INTRAVENOUS
  Filled 2011-11-20: qty 1

## 2011-11-20 MED ORDER — HEPARIN SODIUM (PORCINE) 1000 UNIT/ML IJ SOLN
INTRAMUSCULAR | Status: DC | PRN
  Administered 2011-11-20: 5000 [IU] via INTRAVENOUS
  Administered 2011-11-20: 10000 [IU] via INTRAVENOUS
  Administered 2011-11-20: 18000 [IU] via INTRAVENOUS

## 2011-11-20 MED ORDER — MIDAZOLAM HCL 5 MG/5ML IJ SOLN
INTRAMUSCULAR | Status: DC | PRN
  Administered 2011-11-20 (×3): 2 mg via INTRAVENOUS
  Administered 2011-11-20: 1 mg via INTRAVENOUS
  Administered 2011-11-20 (×2): 2 mg via INTRAVENOUS
  Administered 2011-11-20: 3 mg via INTRAVENOUS

## 2011-11-20 MED ORDER — ALBUTEROL SULFATE (5 MG/ML) 0.5% IN NEBU
2.5000 mg | INHALATION_SOLUTION | Freq: Four times a day (QID) | RESPIRATORY_TRACT | Status: DC | PRN
  Administered 2011-11-20 – 2011-11-21 (×2): 2.5 mg via RESPIRATORY_TRACT
  Filled 2011-11-20 (×2): qty 0.5

## 2011-11-20 MED ORDER — HEMOSTATIC AGENTS (NO CHARGE) OPTIME
TOPICAL | Status: DC | PRN
  Administered 2011-11-20: 1 via TOPICAL

## 2011-11-20 MED ORDER — PANTOPRAZOLE SODIUM 40 MG PO TBEC
40.0000 mg | DELAYED_RELEASE_TABLET | Freq: Every day | ORAL | Status: DC
  Administered 2011-11-22 – 2011-11-30 (×9): 40 mg via ORAL
  Filled 2011-11-20 (×10): qty 1

## 2011-11-20 MED ORDER — DOPAMINE-DEXTROSE 3.2-5 MG/ML-% IV SOLN
0.0000 ug/kg/min | INTRAVENOUS | Status: DC
  Administered 2011-11-21 – 2011-11-22 (×2): 5 ug/kg/min via INTRAVENOUS
  Filled 2011-11-20 (×2): qty 250

## 2011-11-20 MED ORDER — ALLOPURINOL 300 MG PO TABS
300.0000 mg | ORAL_TABLET | Freq: Every day | ORAL | Status: DC
  Administered 2011-11-22 – 2011-11-30 (×9): 300 mg via ORAL
  Filled 2011-11-20 (×10): qty 1

## 2011-11-20 MED ORDER — LACTATED RINGERS IV SOLN
INTRAVENOUS | Status: DC | PRN
  Administered 2011-11-20 (×2): via INTRAVENOUS

## 2011-11-20 MED ORDER — PROTAMINE SULFATE 10 MG/ML IV SOLN
INTRAVENOUS | Status: DC | PRN
  Administered 2011-11-20: 50 mg via INTRAVENOUS
  Administered 2011-11-20 (×2): 100 mg via INTRAVENOUS
  Administered 2011-11-20: 90 mg via INTRAVENOUS
  Administered 2011-11-20: 10 mg via INTRAVENOUS

## 2011-11-20 MED ORDER — LACTATED RINGERS IV SOLN
INTRAVENOUS | Status: DC | PRN
  Administered 2011-11-20: 07:00:00 via INTRAVENOUS

## 2011-11-20 MED ORDER — SODIUM CHLORIDE 0.9 % IV SOLN: INTRAVENOUS | Status: DC

## 2011-11-20 MED ORDER — FAMOTIDINE IN NACL 20-0.9 MG/50ML-% IV SOLN
20.0000 mg | Freq: Two times a day (BID) | INTRAVENOUS | Status: AC
  Administered 2011-11-20: 20 mg via INTRAVENOUS

## 2011-11-20 MED ORDER — INSULIN REGULAR BOLUS VIA INFUSION
0.0000 [IU] | Freq: Three times a day (TID) | INTRAVENOUS | Status: DC
  Filled 2011-11-20: qty 10

## 2011-11-20 MED ORDER — ASPIRIN 81 MG PO CHEW: 324.0000 mg | CHEWABLE_TABLET | Freq: Every day | ORAL | Status: DC

## 2011-11-20 MED ORDER — CALCIUM CHLORIDE 10 % IV SOLN
INTRAVENOUS | Status: DC | PRN
  Administered 2011-11-20: 0.5 g via INTRAVENOUS
  Administered 2011-11-20: .4 g via INTRAVENOUS
  Administered 2011-11-20: .1 g via INTRAVENOUS

## 2011-11-20 MED ORDER — ACETAMINOPHEN 500 MG PO TABS: 1000.0000 mg | ORAL_TABLET | Freq: Four times a day (QID) | ORAL | Status: DC

## 2011-11-20 MED ORDER — ONDANSETRON HCL 4 MG/2ML IJ SOLN
4.0000 mg | Freq: Four times a day (QID) | INTRAMUSCULAR | Status: DC | PRN
  Administered 2011-11-21 – 2011-11-23 (×4): 4 mg via INTRAVENOUS
  Filled 2011-11-20 (×5): qty 2

## 2011-11-20 MED ORDER — ACETAMINOPHEN 160 MG/5ML PO SOLN: 975.0000 mg | Freq: Four times a day (QID) | ORAL | Status: DC

## 2011-11-20 MED ORDER — ACETAMINOPHEN 500 MG PO TABS
1000.0000 mg | ORAL_TABLET | Freq: Four times a day (QID) | ORAL | Status: AC
  Administered 2011-11-21 – 2011-11-25 (×17): 1000 mg via ORAL
  Filled 2011-11-20 (×21): qty 2

## 2011-11-20 MED ORDER — DOCUSATE SODIUM 100 MG PO CAPS
200.0000 mg | ORAL_CAPSULE | Freq: Every day | ORAL | Status: DC
  Administered 2011-11-22 – 2011-11-30 (×8): 200 mg via ORAL
  Filled 2011-11-20 (×10): qty 2

## 2011-11-20 MED ORDER — LACTATED RINGERS IV SOLN
INTRAVENOUS | Status: DC | PRN
  Administered 2011-11-20 (×2): via INTRAVENOUS

## 2011-11-20 MED ORDER — METOPROLOL TARTRATE 12.5 MG HALF TABLET
12.5000 mg | ORAL_TABLET | Freq: Two times a day (BID) | ORAL | Status: DC
  Administered 2011-11-21 – 2011-11-26 (×10): 12.5 mg via ORAL
  Filled 2011-11-20 (×15): qty 1

## 2011-11-20 MED ORDER — LACTATED RINGERS IV SOLN: 500.0000 mL | Freq: Once | INTRAVENOUS | Status: AC | PRN

## 2011-11-20 MED ORDER — SODIUM CHLORIDE 0.9 % IV SOLN
250.0000 mL | INTRAVENOUS | Status: DC
  Administered 2011-11-21: 250 mL via INTRAVENOUS

## 2011-11-20 MED ORDER — ACETAMINOPHEN 160 MG/5ML PO SOLN: 650.0000 mg | ORAL | Status: AC

## 2011-11-20 MED ORDER — ACETAMINOPHEN 160 MG/5ML PO SOLN
975.0000 mg | Freq: Four times a day (QID) | ORAL | Status: AC
  Filled 2011-11-20: qty 40.6

## 2011-11-20 MED ORDER — MAGNESIUM SULFATE 40 MG/ML IJ SOLN
4.0000 g | Freq: Once | INTRAMUSCULAR | Status: AC
  Administered 2011-11-20: 4 g via INTRAVENOUS
  Filled 2011-11-20: qty 100

## 2011-11-20 MED ORDER — PROPOFOL 10 MG/ML IV EMUL
INTRAVENOUS | Status: DC | PRN
  Administered 2011-11-20: 20 mg via INTRAVENOUS
  Administered 2011-11-20: 100 mg via INTRAVENOUS

## 2011-11-20 MED ORDER — SODIUM CHLORIDE 0.9 % IJ SOLN
3.0000 mL | Freq: Two times a day (BID) | INTRAMUSCULAR | Status: DC
  Administered 2011-11-21 – 2011-11-26 (×8): 3 mL via INTRAVENOUS

## 2011-11-20 MED ORDER — LACTATED RINGERS IV SOLN
INTRAVENOUS | Status: DC
  Administered 2011-11-21: 06:00:00 via INTRAVENOUS

## 2011-11-20 MED ORDER — SODIUM CHLORIDE 0.9 % IJ SOLN
3.0000 mL | INTRAMUSCULAR | Status: DC | PRN
  Administered 2011-11-24: 3 mL via INTRAVENOUS

## 2011-11-20 SURGICAL SUPPLY — 141 items
ADAPTER CARDIO PERF ANTE/RETRO (ADAPTER) ×3 IMPLANT
APPLIER CLIP 9.375 MED OPEN (MISCELLANEOUS)
APPLIER CLIP 9.375 SM OPEN (CLIP)
ATTRACTOMAT 16X20 MAGNETIC DRP (DRAPES) ×3 IMPLANT
BAG DECANTER FOR FLEXI CONT (MISCELLANEOUS) ×3 IMPLANT
BANDAGE ELASTIC 4 VELCRO ST LF (GAUZE/BANDAGES/DRESSINGS) ×3 IMPLANT
BANDAGE ELASTIC 6 VELCRO ST LF (GAUZE/BANDAGES/DRESSINGS) ×3 IMPLANT
BANDAGE GAUZE ELAST BULKY 4 IN (GAUZE/BANDAGES/DRESSINGS) ×3 IMPLANT
BASKET HEART  (ORDER IN 25'S) (MISCELLANEOUS) ×1
BASKET HEART (ORDER IN 25'S) (MISCELLANEOUS) ×1
BASKET HEART (ORDER IN 25S) (MISCELLANEOUS) ×1 IMPLANT
BLADE SAW STERNAL (BLADE) ×3 IMPLANT
BLADE SURG 11 STRL SS (BLADE) ×3 IMPLANT
BLADE SURG 12 STRL SS (BLADE) ×3 IMPLANT
BLADE SURG ROTATE 9660 (MISCELLANEOUS) IMPLANT
CANISTER SUCTION 2500CC (MISCELLANEOUS) ×3 IMPLANT
CANNULA GUNDRY RCSP 15FR (MISCELLANEOUS) ×3 IMPLANT
CARDIOBLATE CARDIAC ABLATION (MISCELLANEOUS)
CATH CPB KIT VANTRIGT (MISCELLANEOUS) ×3 IMPLANT
CATH ROBINSON RED A/P 18FR (CATHETERS) ×9 IMPLANT
CATH THORACIC 28FR (CATHETERS) IMPLANT
CATH THORACIC 28FR RT ANG (CATHETERS) IMPLANT
CATH THORACIC 36FR (CATHETERS) IMPLANT
CATH THORACIC 36FR RT ANG (CATHETERS) ×6 IMPLANT
CLIP APPLIE 9.375 MED OPEN (MISCELLANEOUS) IMPLANT
CLIP APPLIE 9.375 SM OPEN (CLIP) IMPLANT
CLIP FOGARTY SPRING 6M (CLIP) ×3 IMPLANT
CLIP TI MEDIUM 24 (CLIP) IMPLANT
CLIP TI WIDE RED SMALL 24 (CLIP) ×3 IMPLANT
CLOTH BEACON ORANGE TIMEOUT ST (SAFETY) ×3 IMPLANT
CONN 1/2X1/2X1/2  BEN (MISCELLANEOUS) ×2
CONN 1/2X1/2X1/2 BEN (MISCELLANEOUS) ×1 IMPLANT
CONN 3/8X1/2 ST GISH (MISCELLANEOUS) ×6 IMPLANT
CONN Y 3/8X3/8X3/8  BEN (MISCELLANEOUS)
CONN Y 3/8X3/8X3/8 BEN (MISCELLANEOUS) IMPLANT
COVER SURGICAL LIGHT HANDLE (MISCELLANEOUS) ×6 IMPLANT
CRADLE DONUT ADULT HEAD (MISCELLANEOUS) ×3 IMPLANT
DERMABOND ADVANCED (GAUZE/BANDAGES/DRESSINGS) ×2
DERMABOND ADVANCED .7 DNX12 (GAUZE/BANDAGES/DRESSINGS) ×1 IMPLANT
DEVICE CARDIOBLATE CARDIAC ABL (MISCELLANEOUS) IMPLANT
DRAIN CHANNEL 32F RND 10.7 FF (WOUND CARE) ×3 IMPLANT
DRAPE CARDIOVASCULAR INCISE (DRAPES) ×2
DRAPE INCISE IOBAN 66X45 STRL (DRAPES) ×6 IMPLANT
DRAPE SLUSH MACHINE 52X66 (DRAPES) IMPLANT
DRAPE SLUSH/WARMER DISC (DRAPES) IMPLANT
DRAPE SRG 135X102X78XABS (DRAPES) ×1 IMPLANT
DRSG COVADERM 4X14 (GAUZE/BANDAGES/DRESSINGS) ×3 IMPLANT
ELECT BLADE 4.0 EZ CLEAN MEGAD (MISCELLANEOUS) ×3
ELECT BLADE 6.5 EXT (BLADE) ×3 IMPLANT
ELECT CAUTERY BLADE 6.4 (BLADE) ×3 IMPLANT
ELECT REM PT RETURN 9FT ADLT (ELECTROSURGICAL) ×9
ELECTRODE BLDE 4.0 EZ CLN MEGD (MISCELLANEOUS) ×1 IMPLANT
ELECTRODE REM PT RTRN 9FT ADLT (ELECTROSURGICAL) ×3 IMPLANT
GLOVE BIO SURGEON STRL SZ 6 (GLOVE) ×6 IMPLANT
GLOVE BIO SURGEON STRL SZ 6.5 (GLOVE) ×10 IMPLANT
GLOVE BIO SURGEON STRL SZ7 (GLOVE) ×6 IMPLANT
GLOVE BIO SURGEON STRL SZ7.5 (GLOVE) ×12 IMPLANT
GLOVE BIO SURGEONS STRL SZ 6.5 (GLOVE) ×5
GLOVE BIOGEL PI IND STRL 6 (GLOVE) ×3 IMPLANT
GLOVE BIOGEL PI IND STRL 6.5 (GLOVE) ×3 IMPLANT
GLOVE BIOGEL PI IND STRL 7.0 (GLOVE) ×2 IMPLANT
GLOVE BIOGEL PI INDICATOR 6 (GLOVE) ×6
GLOVE BIOGEL PI INDICATOR 6.5 (GLOVE) ×6
GLOVE BIOGEL PI INDICATOR 7.0 (GLOVE) ×4
GLOVE EUDERMIC 7 POWDERFREE (GLOVE) IMPLANT
GLOVE ORTHO TXT STRL SZ7.5 (GLOVE) IMPLANT
GLOVE SS BIOGEL STRL SZ 6.5 (GLOVE) ×2 IMPLANT
GLOVE SUPERSENSE BIOGEL SZ 6.5 (GLOVE) ×4
GOWN STRL NON-REIN LRG LVL3 (GOWN DISPOSABLE) ×30 IMPLANT
HEMOSTAT POWDER SURGIFOAM 1G (HEMOSTASIS) ×12 IMPLANT
HEMOSTAT SURGICEL 2X14 (HEMOSTASIS) ×3 IMPLANT
INSERT FOGARTY 61MM (MISCELLANEOUS) IMPLANT
INSERT FOGARTY XLG (MISCELLANEOUS) IMPLANT
KIT BASIN OR (CUSTOM PROCEDURE TRAY) ×3 IMPLANT
KIT ROOM TURNOVER OR (KITS) ×3 IMPLANT
KIT SUCTION CATH 14FR (SUCTIONS) ×3 IMPLANT
KIT VASOVIEW W/TROCAR VH 2000 (KITS) ×3 IMPLANT
LEAD PACING MYOCARDI (MISCELLANEOUS) ×3 IMPLANT
LINE VENT (MISCELLANEOUS) ×3 IMPLANT
MARKER GRAFT CORONARY BYPASS (MISCELLANEOUS) ×9 IMPLANT
NS IRRIG 1000ML POUR BTL (IV SOLUTION) ×15 IMPLANT
PACK OPEN HEART (CUSTOM PROCEDURE TRAY) ×3 IMPLANT
PAD ARMBOARD 7.5X6 YLW CONV (MISCELLANEOUS) ×6 IMPLANT
PENCIL BUTTON HOLSTER BLD 10FT (ELECTRODE) ×3 IMPLANT
PROBE CRYO2-ABLATION MALLABLE (MISCELLANEOUS) IMPLANT
PUNCH AORTIC ROTATE 4.0MM (MISCELLANEOUS) IMPLANT
PUNCH AORTIC ROTATE 4.5MM 8IN (MISCELLANEOUS) ×3 IMPLANT
PUNCH AORTIC ROTATE 5MM 8IN (MISCELLANEOUS) IMPLANT
SET CARDIOPLEGIA MPS 5001102 (MISCELLANEOUS) ×6 IMPLANT
SOLUTION ANTI FOG 6CC (MISCELLANEOUS) IMPLANT
SPONGE GAUZE 4X4 12PLY (GAUZE/BANDAGES/DRESSINGS) ×6 IMPLANT
SPONGE INTESTINAL PEANUT (DISPOSABLE) IMPLANT
SPONGE LAP 18X18 X RAY DECT (DISPOSABLE) ×9 IMPLANT
SPONGE LAP 4X18 X RAY DECT (DISPOSABLE) IMPLANT
SURGIFLO TRUKIT (HEMOSTASIS) ×3 IMPLANT
SUT BONE WAX W31G (SUTURE) ×3 IMPLANT
SUT MNCRL AB 4-0 PS2 18 (SUTURE) ×3 IMPLANT
SUT PROLENE 3 0 SH DA (SUTURE) IMPLANT
SUT PROLENE 3 0 SH1 36 (SUTURE) ×3 IMPLANT
SUT PROLENE 4 0 RB 1 (SUTURE) ×4
SUT PROLENE 4 0 SH DA (SUTURE) ×9 IMPLANT
SUT PROLENE 4-0 RB1 .5 CRCL 36 (SUTURE) ×2 IMPLANT
SUT PROLENE 5 0 C 1 36 (SUTURE) IMPLANT
SUT PROLENE 6 0 C 1 30 (SUTURE) ×6 IMPLANT
SUT PROLENE 6 0 CC (SUTURE) IMPLANT
SUT PROLENE 7 0 BV 1 (SUTURE) IMPLANT
SUT PROLENE 7 0 BV1 MDA (SUTURE) IMPLANT
SUT PROLENE 7 0 DA (SUTURE) IMPLANT
SUT PROLENE 7.0 RB 3 (SUTURE) ×12 IMPLANT
SUT PROLENE 8 0 BV175 6 (SUTURE) ×3 IMPLANT
SUT PROLENE BLUE 7 0 (SUTURE) ×6 IMPLANT
SUT PROLENE POLY MONO (SUTURE) IMPLANT
SUT SILK  1 MH (SUTURE)
SUT SILK 1 MH (SUTURE) IMPLANT
SUT SILK 2 0 SH CR/8 (SUTURE) ×3 IMPLANT
SUT SILK 3 0 SH CR/8 (SUTURE) IMPLANT
SUT STEEL 6MS V (SUTURE) ×6 IMPLANT
SUT STEEL STERNAL CCS#1 18IN (SUTURE) IMPLANT
SUT STEEL SZ 6 DBL 3X14 BALL (SUTURE) ×6 IMPLANT
SUT VIC AB 1 CTX 18 (SUTURE) IMPLANT
SUT VIC AB 1 CTX 36 (SUTURE) ×8
SUT VIC AB 1 CTX36XBRD ANBCTR (SUTURE) ×4 IMPLANT
SUT VIC AB 2-0 CT1 27 (SUTURE) ×2
SUT VIC AB 2-0 CT1 TAPERPNT 27 (SUTURE) ×1 IMPLANT
SUT VIC AB 2-0 CTX 27 (SUTURE) IMPLANT
SUT VIC AB 3-0 SH 27 (SUTURE)
SUT VIC AB 3-0 SH 27X BRD (SUTURE) IMPLANT
SUT VIC AB 3-0 X1 27 (SUTURE) IMPLANT
SUT VICRYL 4-0 PS2 18IN ABS (SUTURE) IMPLANT
SUTURE E-PAK OPEN HEART (SUTURE) ×3 IMPLANT
SYR 3ML LL SCALE MARK (SYRINGE) ×3 IMPLANT
SYS ATRICLIP LAA EXCLUSION 45 (CLIP) IMPLANT
SYSTEM SAHARA CHEST DRAIN ATS (WOUND CARE) ×3 IMPLANT
TAPE CLOTH SURG 4X10 WHT LF (GAUZE/BANDAGES/DRESSINGS) ×3 IMPLANT
TOWEL NATURAL 10PK STERILE (DISPOSABLE) ×3 IMPLANT
TOWEL OR 17X24 6PK STRL BLUE (TOWEL DISPOSABLE) ×6 IMPLANT
TOWEL OR 17X26 10 PK STRL BLUE (TOWEL DISPOSABLE) ×6 IMPLANT
TRAY FOLEY IC TEMP SENS 16FR (CATHETERS) ×3 IMPLANT
TUBING INSUFFLATION 10FT LAP (TUBING) ×3 IMPLANT
UNDERPAD 30X30 INCONTINENT (UNDERPADS AND DIAPERS) ×3 IMPLANT
WATER STERILE IRR 1000ML POUR (IV SOLUTION) ×6 IMPLANT

## 2011-11-20 NOTE — Progress Notes (Signed)
Pt's BP 59, BP 127/70.  Converted to SB from A. Fib.  Flavia Shipper NP informed.  Instructed to keep pt on current cardem.  Will continue to monitor.

## 2011-11-20 NOTE — Preoperative (Signed)
Beta Blockers   Reason not to administer Beta Blockers:Not Applicable, took Beta Blocker this am

## 2011-11-20 NOTE — OR Nursing (Signed)
Leg procedure start at 08:29. Separate time out conducted with Dr. Donata Clay at 08:53. Chest procedure start at 09:00.

## 2011-11-20 NOTE — Progress Notes (Signed)
[facility] BRIEF SICU PROGRESS NOTE  Day of Surgery  S/P Procedure(s) (LRB): CORONARY ARTERY BYPASS GRAFTING (CABG) (N/A) MAZE (N/A)   Extubated uneventfully AAI paced with stable BP and C.I. > 2 Chest tube output low UOP > 100 mL/hr Labs okay  Plan: Continue routine early postop  Aysen Shieh H 11/20/2011 10:40 PM

## 2011-11-20 NOTE — Anesthesia Preprocedure Evaluation (Signed)
Anesthesia Evaluation  Patient identified by MRN, date of birth, ID band Patient awake    Reviewed: Allergy & Precautions, H&P , NPO status , Patient's Chart, lab work & pertinent test results  Airway Mallampati: I TM Distance: >3 FB Neck ROM: full    Dental  (+) Poor Dentition   Pulmonary shortness of breath,          Cardiovascular hypertension, + angina + CAD and + Past MI + dysrhythmias Atrial Fibrillation Rhythm:regular Rate:Normal     Neuro/Psych    GI/Hepatic   Endo/Other    Renal/GU      Musculoskeletal   Abdominal   Peds  Hematology   Anesthesia Other Findings   Reproductive/Obstetrics                           Anesthesia Physical Anesthesia Plan  ASA: III  Anesthesia Plan: General ETT   Post-op Pain Management:    Induction: Intravenous  Airway Management Planned: Oral ETT  Additional Equipment: Arterial line, CVP, PA Cath and TEE  Intra-op Plan:   Post-operative Plan: Post-operative intubation/ventilation  Informed Consent: I have reviewed the patients History and Physical, chart, labs and discussed the procedure including the risks, benefits and alternatives for the proposed anesthesia with the patient or authorized representative who has indicated his/her understanding and acceptance.     Plan Discussed with: Anesthesiologist, CRNA and Surgeon  Anesthesia Plan Comments:         Anesthesia Quick Evaluation

## 2011-11-20 NOTE — Procedures (Signed)
Extubation Procedure Note  Patient Details:   Name: Marcus [name] DOB: 08-08-1933 MRN: [id number]   Airway Documentation:     Evaluation  O2 sats: stable throughout Complications: No apparent complications Patient did tolerate procedure well. Bilateral Breath Sounds: Clear;Diminished   Yes  Filbert Schilder 11/20/2011, 9:42 PM  Extubated patient per MD order to a 6 L nasal cannula.  No evidence of stridor present.  NIF 22  VC 0.7  Patient able to breath around a deflated cuff

## 2011-11-20 NOTE — Transfer of Care (Signed)
Immediate Anesthesia Transfer of Care Note  Patient: Marcus [name]  Procedure(s) Performed: Procedure(s) (LRB): CORONARY ARTERY BYPASS GRAFTING (CABG) (N/A) MAZE (N/A)  Patient Location: ICU  Anesthesia Type: General  Level of Consciousness: Patient remains intubated per anesthesia plan  Airway & Oxygen Therapy: Patient remains intubated per anesthesia plan  Post-op Assessment: Report given to PACU RN  Post vital signs: Reviewed  Complications: No apparent anesthesia complications

## 2011-11-20 NOTE — Brief Op Note (Signed)
11/15/2011 - 11/20/2011  12:01 PM  PATIENT:  Marcus [name]  78 y.o. male  PRE-OPERATIVE DIAGNOSIS:  1.Coronary Artery Disease. 2. Paroxymal Atrial Fibrillation.   POST-OPERATIVE DIAGNOSIS:  1.Coronary Artery Disease 2.Paroxymal Artial Fibrillation.   PROCEDURE:  Procedure(s) (LRB): CORONARY ARTERY BYPASS GRAFTING (CABG) x 3 (LIMA to LAD, SVG to OM, and SVG to Acute Marginal) with EVH from the right thigh;Left sided MAZE with oversew of the LA appendage  SURGEON:  Surgeon(s) and Role:    * Kerin Perna, MD - Primary  PHYSICIAN ASSISTANT: Doree Fudge PA-C   ANESTHESIA:   general  EBL:  Total I/O In: 1400 [I.V.:1400] Out: 1175 [Urine:1175]   DRAINS:  Chest Tube(s) in the in the mediastinal and pleural spaces   COUNTS CORRECT:  YES  DICTATION: .Dragon Dictation  PLAN OF CARE: Admit to inpatient   PATIENT DISPOSITION:  ICU - intubated and hemodynamically stable.   Delay start of Pharmacological VTE agent (>24hrs) due to surgical blood loss or risk of bleeding: yes  PRE OP WEIGHT: 129 kg

## 2011-11-20 NOTE — OR Nursing (Signed)
Call made to volunteers desk for family at 13:00 at removal of retractor. Call made to unit 2300 at 13:00 at removal of retractor, again at skin suture at 13: 22 and patient exit.

## 2011-11-20 NOTE — Anesthesia Postprocedure Evaluation (Signed)
  Anesthesia Post-op Note  Patient: Marcus [name]  Procedure(s) Performed: Procedure(s) (LRB): CORONARY ARTERY BYPASS GRAFTING (CABG) (N/A) MAZE (N/A)  Patient Location: SICU  Anesthesia Type: General  Level of Consciousness: sedated, unresponsive and Patient remains intubated per anesthesia plan  Airway and Oxygen Therapy: Patient remains intubated per anesthesia plan and Patient placed on Ventilator (see vital sign flow sheet for setting)  Post-op Pain: none  Post-op Assessment: Post-op Vital signs reviewed, Patient's Cardiovascular Status Stable, Respiratory Function Stable, Patent Airway, No signs of Nausea or vomiting and Pain level controlled  Post-op Vital Signs: stable  Complications: No apparent anesthesia complications

## 2011-11-20 NOTE — Anesthesia Procedure Notes (Signed)
Procedures 1610-9604: Procedures: Right IJ Theone Murdoch Catheter Insertion: The patient was identified and consent obtained.  TO was performed, and full barrier precautions were used.  The skin was anesthetized with lidocaine-4cc plain with 25g needle.  Once the vein was located with the 22 ga. needle using ultrasound guidance , the wire was inserted into the vein.  The wire location was confirmed with ultrasound.  The tissue was dilated and the 8.5 [place] cordis catheter was carefully inserted. Afterwards Theone Murdoch catheter was inserted. PA catheter at 48cm.  The patient tolerated the procedure well.   CE

## 2011-11-20 NOTE — Progress Notes (Signed)
The patient was examined and preop studies reviewed. There has been no change from the prior exam and the patient is ready for surgery. Plan muiltivessel CABG and Maze

## 2011-11-20 NOTE — Progress Notes (Signed)
  Echocardiogram Echocardiogram Transesophageal has been performed.  Marcus [name] 11/20/2011, 8:14 AM

## 2011-11-21 ENCOUNTER — Encounter (HOSPITAL_COMMUNITY): Payer: Self-pay | Admitting: Cardiothoracic Surgery

## 2011-11-21 ENCOUNTER — Inpatient Hospital Stay (HOSPITAL_COMMUNITY): Payer: Medicare Other

## 2011-11-21 ENCOUNTER — Other Ambulatory Visit: Payer: Self-pay

## 2011-11-21 DIAGNOSIS — E1165 Type 2 diabetes mellitus with hyperglycemia: Secondary | ICD-10-CM

## 2011-11-21 DIAGNOSIS — IMO0001 Reserved for inherently not codable concepts without codable children: Secondary | ICD-10-CM

## 2011-11-21 LAB — POCT I-STAT 3, ART BLOOD GAS (G3+)
Acid-base deficit: 2 mmol/L (ref 0.0–2.0)
Bicarbonate: 25.2 mEq/L — ABNORMAL HIGH (ref 20.0–24.0)
Bicarbonate: 25.2 mEq/L — ABNORMAL HIGH (ref 20.0–24.0)
Bicarbonate: 24.9 mEq/L — ABNORMAL HIGH (ref 20.0–24.0)
Bicarbonate: 28.7 mEq/L — ABNORMAL HIGH (ref 20.0–24.0)
O2 Saturation: 91 %
O2 Saturation: 92 %
O2 Saturation: 94 %
O2 Saturation: 95 %
O2 Saturation: 87 %
O2 Saturation: 94 %
Patient temperature: 37.3
Patient temperature: 37
Patient temperature: 98.9
Patient temperature: 98.9
TCO2: 27 mmol/L (ref 0–100)
TCO2: 27 mmol/L (ref 0–100)
TCO2: 27 mmol/L (ref 0–100)
TCO2: 30 mmol/L (ref 0–100)
pCO2 arterial: 47.4 mmHg — ABNORMAL HIGH (ref 35.0–45.0)
pCO2 arterial: 48 mmHg — ABNORMAL HIGH (ref 35.0–45.0)
pCO2 arterial: 51.5 mmHg — ABNORMAL HIGH (ref 35.0–45.0)
pCO2 arterial: 45.6 mmHg — ABNORMAL HIGH (ref 35.0–45.0)
pCO2 arterial: 57.5 mmHg (ref 35.0–45.0)
pCO2 arterial: 53.3 mmHg — ABNORMAL HIGH (ref 35.0–45.0)
pH, Arterial: 7.307 — ABNORMAL LOW (ref 7.350–7.450)
pH, Arterial: 7.333 — ABNORMAL LOW (ref 7.350–7.450)
pH, Arterial: 7.302 — ABNORMAL LOW (ref 7.350–7.450)
pH, Arterial: 7.352 (ref 7.350–7.450)
pH, Arterial: 7.245 — ABNORMAL LOW (ref 7.350–7.450)
pH, Arterial: 7.339 — ABNORMAL LOW (ref 7.350–7.450)
pO2, Arterial: 65 mmHg — ABNORMAL LOW (ref 80.0–100.0)
pO2, Arterial: 70 mmHg — ABNORMAL LOW (ref 80.0–100.0)
pO2, Arterial: 71 mmHg — ABNORMAL LOW (ref 80.0–100.0)
pO2, Arterial: 77 mmHg — ABNORMAL LOW (ref 80.0–100.0)
pO2, Arterial: 62 mmHg — ABNORMAL LOW (ref 80.0–100.0)
pO2, Arterial: 175 mmHg — ABNORMAL HIGH (ref 80.0–100.0)

## 2011-11-21 LAB — GLUCOSE, CAPILLARY
Glucose-Capillary: 113 mg/dL — ABNORMAL HIGH (ref 70–99)
Glucose-Capillary: 117 mg/dL — ABNORMAL HIGH (ref 70–99)
Glucose-Capillary: 125 mg/dL — ABNORMAL HIGH (ref 70–99)
Glucose-Capillary: 128 mg/dL — ABNORMAL HIGH (ref 70–99)
Glucose-Capillary: 132 mg/dL — ABNORMAL HIGH (ref 70–99)
Glucose-Capillary: 135 mg/dL — ABNORMAL HIGH (ref 70–99)
Glucose-Capillary: 139 mg/dL — ABNORMAL HIGH (ref 70–99)
Glucose-Capillary: 140 mg/dL — ABNORMAL HIGH (ref 70–99)
Glucose-Capillary: 154 mg/dL — ABNORMAL HIGH (ref 70–99)
Glucose-Capillary: 174 mg/dL — ABNORMAL HIGH (ref 70–99)

## 2011-11-21 LAB — CBC
HCT: 27.5 % — ABNORMAL LOW (ref 39.0–52.0)
Hemoglobin: 10.4 g/dL — ABNORMAL LOW (ref 13.0–17.0)
Hemoglobin: 9.1 g/dL — ABNORMAL LOW (ref 13.0–17.0)
MCH: 30 pg (ref 26.0–34.0)
MCHC: 33.3 g/dL (ref 30.0–36.0)
MCV: 90.5 fL (ref 78.0–100.0)
Platelets: 120 10*3/uL — ABNORMAL LOW (ref 150–400)
RBC: 3.04 MIL/uL — ABNORMAL LOW (ref 4.22–5.81)
WBC: 9.5 10*3/uL (ref 4.0–10.5)

## 2011-11-21 LAB — POCT I-STAT, CHEM 8
BUN: 21 mg/dL (ref 6–23)
Calcium, Ion: 1.26 mmol/L (ref 1.12–1.32)
Chloride: 103 mEq/L (ref 96–112)
HCT: 27 % — ABNORMAL LOW (ref 39.0–52.0)
Potassium: 4.8 mEq/L (ref 3.5–5.1)
Sodium: 139 mEq/L (ref 135–145)

## 2011-11-21 LAB — MAGNESIUM
Magnesium: 2.1 mg/dL (ref 1.5–2.5)
Magnesium: 2 mg/dL (ref 1.5–2.5)

## 2011-11-21 LAB — BASIC METABOLIC PANEL
BUN: 13 mg/dL (ref 6–23)
Creatinine, Ser: 1.24 mg/dL (ref 0.50–1.35)
GFR calc Af Amer: 63 mL/min — ABNORMAL LOW (ref >90)
Sodium: 136 mEq/L (ref 135–145)

## 2011-11-21 LAB — POCT I-STAT 7, (LYTES, BLD GAS, ICA,H+H)
Bicarbonate: 24.5 mEq/L — ABNORMAL HIGH (ref 20.0–24.0)
Hemoglobin: 9.2 g/dL — ABNORMAL LOW (ref 13.0–17.0)
O2 Saturation: 90 %
Patient temperature: 37
Sodium: 138 mEq/L (ref 135–145)
TCO2: 26 mmol/L (ref 0–100)
pH, Arterial: 7.285 — ABNORMAL LOW (ref 7.350–7.450)

## 2011-11-21 LAB — CREATININE, SERUM
GFR calc Af Amer: 45 mL/min — ABNORMAL LOW (ref >90)
GFR calc non Af Amer: 39 mL/min — ABNORMAL LOW (ref >90)

## 2011-11-21 MED ORDER — CALCIUM CHLORIDE 10 % IV SOLN
1.0000 g | Freq: Once | INTRAVENOUS | Status: AC
  Administered 2011-11-21: 1 g via INTRAVENOUS
  Filled 2011-11-21: qty 10

## 2011-11-21 MED ORDER — CALCIUM CHLORIDE 10 % IV SOLN
1.0000 g | Freq: Once | INTRAVENOUS | Status: AC | PRN
  Filled 2011-11-21: qty 10

## 2011-11-21 MED ORDER — FUROSEMIDE 10 MG/ML IJ SOLN
8.0000 mg/h | INTRAVENOUS | Status: DC
  Administered 2011-11-21 – 2011-11-22 (×2): 8 mg/h via INTRAVENOUS
  Filled 2011-11-21 (×4): qty 25

## 2011-11-21 MED ORDER — TRAMADOL HCL 50 MG PO TABS
50.0000 mg | ORAL_TABLET | Freq: Four times a day (QID) | ORAL | Status: DC | PRN
  Administered 2011-11-21 – 2011-11-28 (×6): 50 mg via ORAL
  Filled 2011-11-21 (×7): qty 1

## 2011-11-21 MED ORDER — ALBUMIN HUMAN 5 % IV SOLN: 12.5000 g | Freq: Once | INTRAVENOUS | Status: AC | PRN

## 2011-11-21 MED ORDER — ALBUMIN HUMAN 5 % IV SOLN
12.5000 g | Freq: Once | INTRAVENOUS | Status: AC
  Administered 2011-11-21: 12.5 g via INTRAVENOUS

## 2011-11-21 MED ORDER — CHLORHEXIDINE GLUCONATE 0.12 % MT SOLN
15.0000 mL | Freq: Two times a day (BID) | OROMUCOSAL | Status: DC
  Administered 2011-11-21: 15 mL via OROMUCOSAL
  Filled 2011-11-21: qty 15

## 2011-11-21 MED ORDER — INSULIN ASPART 100 UNIT/ML ~~LOC~~ SOLN
0.0000 [IU] | SUBCUTANEOUS | Status: DC
  Administered 2011-11-21 – 2011-11-25 (×10): 2 [IU] via SUBCUTANEOUS

## 2011-11-21 MED ORDER — FUROSEMIDE 10 MG/ML IJ SOLN
40.0000 mg | Freq: Once | INTRAMUSCULAR | Status: AC
  Administered 2011-11-21: 40 mg via INTRAVENOUS
  Filled 2011-11-21: qty 4

## 2011-11-21 MED ORDER — INSULIN GLARGINE 100 UNIT/ML ~~LOC~~ SOLN
24.0000 [IU] | SUBCUTANEOUS | Status: DC
  Administered 2011-11-21 – 2011-11-30 (×10): 24 [IU] via SUBCUTANEOUS

## 2011-11-21 MED ORDER — BIOTENE DRY MOUTH MT LIQD: 15.0000 mL | Freq: Two times a day (BID) | OROMUCOSAL | Status: DC

## 2011-11-21 MED ORDER — CHLORHEXIDINE GLUCONATE 0.12 % MT SOLN
OROMUCOSAL | Status: AC
  Administered 2011-11-21: 15 mL via ORAL
  Filled 2011-11-21: qty 15

## 2011-11-21 MED ORDER — FLUTICASONE-SALMETEROL 250-50 MCG/DOSE IN AEPB
1.0000 | INHALATION_SPRAY | Freq: Two times a day (BID) | RESPIRATORY_TRACT | Status: DC
  Administered 2011-11-21 – 2011-11-29 (×16): 1 via RESPIRATORY_TRACT
  Filled 2011-11-21 (×2): qty 14

## 2011-11-21 MED ORDER — NOREPINEPHRINE BITARTRATE 1 MG/ML IJ SOLN
0.0000 ug/min | INTRAVENOUS | Status: DC
  Administered 2011-11-21: 3 ug/min via INTRAVENOUS
  Filled 2011-11-21: qty 8

## 2011-11-21 NOTE — Progress Notes (Signed)
EG7+ Result will not download.   PH 7.285 CO2 51.6  O2 67 HCO3 24.5  Base -2 SaO2 90%  Na 138 K 4.1 iCa 1.33 HCT 27 HGB 9.2
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
## 2011-11-21 NOTE — Progress Notes (Signed)
At approximately 0200 noted that Mr. Weyer had a significant decrease in his POx on 6 L [place] and a notable drop in his blood pressure to the low 80s. Neo increased and another albumin has been given. Placed on 50% Venturi Mask and administered another PRN breathing treatment. He is not moving a lot of air upon auscultation. ABG results showed a respiratory acidosis with PCO2 . He states that he "feels like he needs a little help breathing." RT notified to bring BiPAP to patient to assist in WOB and acidosis. AM Labs drawn and PCXR obtained at this time. BP still low on Neo and 3/4 albumins. Will give the remaining albumin per orders while awaiting STAT labs. He is lethargic but mentating at this time with an adequate RR in the 15-18 range. CT output has been unremarkable and consistent with prior hours and UOP > 50cc/h. Will continue to monitor at this time.
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
## 2011-11-21 NOTE — Progress Notes (Signed)
Patient ID: Marcus [name], male   DOB: 13-Sep-1932, 78 y.o.   MRN: [id number] ABG better with BIPAP BP 113/36  Pulse 81  Temp(Src) 98.8 F (37.1 C) (Oral)  Resp 19  Ht 5\' 11"  (1.803 m)  Wt 300 lb 1.6 oz (136.124 kg)  BMI 41.86 kg/m2  SpO2 95% UO ~ 150 /hour, lasix gtt + 40 mg bolus  Continue current care

## 2011-11-21 NOTE — Progress Notes (Signed)
While drawing a F/U ABG after being on BiPAP for an hour, Mr. Kerstein started to wretch and appeared to try to vomit. BiPAP mask was removed, airway suctioned without emesis and he quickly became unconscious and was agonal breathing; we quickly lowered his head and began to assist him with the BMV on 100% oxygen. He was briefly unresponsive for approx 2 min. ABG on BiPAP was unremarkable. Levophed increased to max dose of 10 mcg/min and I have been unable to wean Neo synephrine at this time. CI has been consistently >3 even after the decrease in Milrinone. Patient currently on 100% NRB and is able to answer questions but still appears to have increased respiratory effort and falls asleep easily. Will follow for 30 min and call MD again if no improvement.

## 2011-11-21 NOTE — Op Note (Signed)
NAME:  Marcus [name], Marcus [name]                 ACCOUNT NO.:  [id number]  MEDICAL RECORD NO.:  [id number]  LOCATION:  2310                         FACILITY:  [facility]  PHYSICIAN:  Kerin Perna, M.D.  DATE OF BIRTH:  Dec 16, 1932  DATE OF PROCEDURE:  11/20/2011 DATE OF DISCHARGE:                              OPERATIVE REPORT   OPERATION: 1. Coronary artery bypass grafting x3 (left internal mammary artery     left anterior descending, saphenous vein graft to circumflex     marginal, saphenous vein graft to right ventricular marginal). 2. Endoscopic harvest of right leg greater saphenous vein. 3. Left atrial maze procedure. 4. Ligation of left atrial appendage.  PREOPERATIVE DIAGNOSES:  Unstable angina with severe multivessel coronary disease, paroxysmal atrial fibrillation.  POSTOPERATIVE DIAGNOSES:  Unstable angina with severe multivessel coronary disease, paroxysmal atrial fibrillation.  SURGEON:  Kerin Perna, MD  ASSISTANT:  Doree Fudge, PA-C  ANESTHESIA:  General by Dr. Laverle Hobby.  INDICATIONS:  The patient is a 78 year old obese none smoker who presents with unstable angina and rapid atrial fibrillation.  Echo showed fairly well-preserved LV function, and his atrial fibrillation was treated with amiodarone and Cardizem.  Cardiac catheterization, however, demonstrated a 99% stenosis to the LAD, high-grade stenosis of a nondominant right, and 80% stenosis of the circumflex.  He is felt to be candidate for combined CABG with maze procedure.  Prior to surgery, I discussed the results of the cardiac cath and echo with the patient and family.  I discussed indications and expected benefits of coronary artery bypass grafting and maze procedure for treatment of his coronary artery disease and paroxysmally atrial fibrillation.  I reviewed with the patient and family the major aspects of the surgery including the use of general anesthesia, the use of cardiopulmonary bypass and  general anesthesia, the expected postoperative recovery, and potential risks of stroke, bleeding, respiratory insufficiency with ventilator dependence, pneumonia, MI, and death.  After reviewing these issues, the patient demonstrated his understanding and agreed to proceed with surgery.  OPERATIVE FINDINGS: 1. Obese body habitus.  Difficult exposure of the heart. 2. Somewhat dilated saphenous vein, but adequate for conduit. 3. No blood products required. 4. No significant mitral regurgitation on echo.  PROCEDURE:  The patient was brought to the operative room and placed supine on the operating table.  General anesthesia was induced.  A transesophageal 2D echo probe was placed by the anesthesiologist.  A proper time-out was performed.  After the patient was prepped and draped as a sterile field, a sternal incision was made as the saphenous vein was harvested endoscopically from the right leg.  The left internal mammary artery was harvested as a pedicle graft from its origin at the subclavian vessels.  It was a good vessel with excellent flow.  The sternal retractor was placed using the deep blades due to the patient's obese body habitus.  The pericardium was opened and suspended.  The ascending aorta was inspected.  It was somewhat friable, but not calcified.  Ascending aorta and right atrium were cannulated through pursestrings.  After the heparin had been administered.  The corners were identified for grafting.  The cardioplegia catheters were placed  for both antegrade and retrograde cold blood cardioplegia, then the patient was cooled to 32 degrees.  Aortic cross-clamp was applied.  An 1 L of cold blood cardioplegia was delivered in split doses between the antegrade aortic and retrograde coronary sinus catheters.  There is a good cardioplegic arrest and septal temperature dropped less than 15 degrees.  Cardioplegia was delivered every 20 minutes.  The distal coronary anastomoses  were performed.  The first distal anastomosis was the RV marginal, had a proximal 80% stenosis.  It was a 1.5-mm vessel.  Reverse saphenous vein was sewn end-to-side running 7 Prolene with good flow through graft.  The second distal anastomosis was to the distal circumflex marginal.  This had a proximal 80% stenosis.  A reverse saphenous vein was sewn end-to-side running 7-0 Prolene with good flow through the graft.  The third distal anastomosis was to the mid LAD.  It was 1.7-mm vessel, proximal 99% stenosis.  The left IMA pedicle was brought through an opening in the left lateral pericardium, was brought down onto the LAD and sewn end-to-side with running 8-0 Prolene.  There was excellent flow through the anastomosis after briefly releasing the pedicle bulldog on the mammary artery.  The bulldog was replaced, and the pedicle secured to the epicardium with 6-0 Prolene. Cardioplegia was redosed. It should be noted that prior to the construction of the distal anastomosis of the left atrium, maze procedure was performed.  First the left-sided veins were encircled with a vessel loop.  After the superior vein was dissected away from the pulmonary artery through the ligament of [facility].  Two ablation lines using the radiofrequency bipolar clamp device were placed around the left-sided veins.  Next, the ablation lines were placed around the right-sided veins after the superior right pulmonary vein was dissected off the right main PA, and the confluence of veins were encircled with a vessel loop.  The bipolar radiofrequency clamp was used to place 2 ablation lines at the confluence of the right- sided pulmonary veins with the left atrium.  Next, the left atrial appendage was ligated with a silk suture, as well as isolated with a pledgeted 4-0 Prolene suture.  After the distal coronary anastomoses were performed, the 2 proximal vein anastomoses were created on the ascending aorta using a 4.5  mm punch running 7-0 Prolene.  Air was vented from the coronaries with a dose of retrograde warm blood cardioplegia and after the final graft was tied, the crossclamp was removed.  Heart resumed a spontaneous rhythm, which was a sinus rhythm.  The grafts were de-aired and each had good flow.  Hemostasis was documented at the proximal and distal anastomoses.  The patient was rewarmed and reperfused.  Temporary pacing wires were applied.  The lungs re- expanded, the ventilator was resumed.  After the patient was adequately rewarmed, he was weaned off bypass on low-dose dopamine and milrinone with excellent hemodynamics.  Protamine was administered without adverse reaction.  The TEE showed normal LV function.  No significant MR.  The superior pericardial fat was closed over the aorta.  An anterior mediastinal and left pleural chest tube were placed and brought through separate incisions.  The sternum was closed with wire.  The pectoralis fascia was closed with running #1 Vicryl.  Subcutaneous and skin layers were closed in running Vicryl and sterile dressings were applied.  Total cardiopulmonary bypass time was 127 minutes.     Kerin Perna, M.D.     PV/MEDQ  D:  11/20/2011  T:  11/21/2011  Job:  [id number]  cc:   Veverly Fells. Excell Seltzer, MD

## 2011-11-22 ENCOUNTER — Inpatient Hospital Stay (HOSPITAL_COMMUNITY): Payer: Medicare Other

## 2011-11-22 ENCOUNTER — Encounter (HOSPITAL_COMMUNITY)
Admission: EM | Disposition: A | Discharge status: Home Health | Payer: Self-pay | Source: Ambulatory Visit | Attending: Cardiothoracic Surgery

## 2011-11-22 ENCOUNTER — Encounter (HOSPITAL_COMMUNITY): Payer: Self-pay | Admitting: Anesthesiology

## 2011-11-22 ENCOUNTER — Inpatient Hospital Stay (HOSPITAL_COMMUNITY): Payer: Medicare Other | Admitting: Anesthesiology

## 2011-11-22 DIAGNOSIS — J90 Pleural effusion, not elsewhere classified: Secondary | ICD-10-CM

## 2011-11-22 HISTORY — PX: CHEST TUBE INSERTION: SHX231

## 2011-11-22 LAB — POCT I-STAT 3, ART BLOOD GAS (G3+)
Acid-Base Excess: 1 mmol/L (ref 0.0–2.0)
Acid-Base Excess: 2 mmol/L (ref 0.0–2.0)
O2 Saturation: 94 %
Patient temperature: 98.9
pCO2 arterial: 51.1 mmHg — ABNORMAL HIGH (ref 35.0–45.0)
pH, Arterial: 7.281 — ABNORMAL LOW (ref 7.350–7.450)
pO2, Arterial: 78 mmHg — ABNORMAL LOW (ref 80.0–100.0)

## 2011-11-22 LAB — BASIC METABOLIC PANEL
BUN: 22 mg/dL (ref 6–23)
BUN: 27 mg/dL — ABNORMAL HIGH (ref 6–23)
CO2: 28 mEq/L (ref 19–32)
CO2: 29 mEq/L (ref 19–32)
Calcium: 8.7 mg/dL (ref 8.4–10.5)
Calcium: 8.7 mg/dL (ref 8.4–10.5)
Chloride: 102 mEq/L (ref 96–112)
Chloride: 100 mEq/L (ref 96–112)
Creatinine, Ser: 1.74 mg/dL — ABNORMAL HIGH (ref 0.50–1.35)
Creatinine, Ser: 1.69 mg/dL — ABNORMAL HIGH (ref 0.50–1.35)
GFR calc Af Amer: 42 mL/min — ABNORMAL LOW (ref >90)
GFR calc Af Amer: 43 mL/min — ABNORMAL LOW (ref >90)
GFR calc non Af Amer: 36 mL/min — ABNORMAL LOW (ref >90)
GFR calc non Af Amer: 37 mL/min — ABNORMAL LOW (ref >90)
Glucose, Bld: 121 mg/dL — ABNORMAL HIGH (ref 70–99)
Glucose, Bld: 129 mg/dL — ABNORMAL HIGH (ref 70–99)
Potassium: 4.4 mEq/L (ref 3.5–5.1)
Potassium: 4.3 mEq/L (ref 3.5–5.1)
Sodium: 138 mEq/L (ref 135–145)
Sodium: 137 mEq/L (ref 135–145)

## 2011-11-22 LAB — CBC
HCT: 26.4 % — ABNORMAL LOW (ref 39.0–52.0)
Hemoglobin: 8.8 g/dL — ABNORMAL LOW (ref 13.0–17.0)
MCH: 30.6 pg (ref 26.0–34.0)
MCHC: 33.3 g/dL (ref 30.0–36.0)
MCV: 91.7 fL (ref 78.0–100.0)
Platelets: 97 10*3/uL — ABNORMAL LOW (ref 150–400)
RBC: 2.88 MIL/uL — ABNORMAL LOW (ref 4.22–5.81)
RDW: 14.1 % (ref 11.5–15.5)
WBC: 9 10*3/uL (ref 4.0–10.5)

## 2011-11-22 LAB — GLUCOSE, CAPILLARY
Glucose-Capillary: 112 mg/dL — ABNORMAL HIGH (ref 70–99)
Glucose-Capillary: 123 mg/dL — ABNORMAL HIGH (ref 70–99)
Glucose-Capillary: 124 mg/dL — ABNORMAL HIGH (ref 70–99)
Glucose-Capillary: 127 mg/dL — ABNORMAL HIGH (ref 70–99)

## 2011-11-22 LAB — TYPE AND SCREEN
Antibody Screen: NEGATIVE
Unit division: 0
Unit division: 0
Unit division: 0

## 2011-11-22 SURGERY — CHEST TUBE INSERTION
Anesthesia: Monitor Anesthesia Care | Site: Chest | Laterality: Right | Wound class: Clean Contaminated

## 2011-11-22 MED ORDER — FENTANYL CITRATE 0.05 MG/ML IJ SOLN: 25.0000 ug | INTRAMUSCULAR | Status: DC | PRN

## 2011-11-22 MED ORDER — OXYCODONE HCL 5 MG PO TABS
5.0000 mg | ORAL_TABLET | ORAL | Status: DC | PRN
  Administered 2011-11-22 – 2011-11-29 (×12): 5 mg via ORAL
  Filled 2011-11-22 (×3): qty 1
  Filled 2011-11-22: qty 2
  Filled 2011-11-22 (×7): qty 1

## 2011-11-22 MED ORDER — FENTANYL CITRATE 0.05 MG/ML IJ SOLN
50.0000 ug | INTRAMUSCULAR | Status: DC | PRN
  Administered 2011-11-22 (×2): 50 ug via INTRAVENOUS
  Filled 2011-11-22: qty 2

## 2011-11-22 MED ORDER — SODIUM CHLORIDE 0.9 % IV SOLN
INTRAVENOUS | Status: DC | PRN
  Administered 2011-11-22: 16:00:00 via INTRAVENOUS

## 2011-11-22 MED ORDER — 0.9 % SODIUM CHLORIDE (POUR BTL) OPTIME
TOPICAL | Status: DC | PRN
  Administered 2011-11-22: 1000 mL

## 2011-11-22 MED ORDER — ENOXAPARIN SODIUM 40 MG/0.4ML ~~LOC~~ SOLN
40.0000 mg | SUBCUTANEOUS | Status: DC
  Administered 2011-11-22: 40 mg via SUBCUTANEOUS
  Filled 2011-11-22: qty 0.4

## 2011-11-22 MED ORDER — FENTANYL CITRATE 0.05 MG/ML IJ SOLN
INTRAMUSCULAR | Status: DC | PRN
  Administered 2011-11-22 (×4): 25 ug via INTRAVENOUS

## 2011-11-22 MED ORDER — DOPAMINE-DEXTROSE 3.2-5 MG/ML-% IV SOLN
INTRAVENOUS | Status: DC | PRN
  Administered 2011-11-22: 5 ug/kg/min via INTRAVENOUS

## 2011-11-22 MED ORDER — MORPHINE SULFATE 2 MG/ML IJ SOLN
INTRAMUSCULAR | Status: AC
  Filled 2011-11-22: qty 1

## 2011-11-22 MED ORDER — DOPAMINE-DEXTROSE 3.2-5 MG/ML-% IV SOLN: 2.5000 ug/kg/min | INTRAVENOUS | Status: DC

## 2011-11-22 MED ORDER — LIDOCAINE HCL (PF) 1 % IJ SOLN
INTRAMUSCULAR | Status: DC | PRN
  Administered 2011-11-22: 12 mL

## 2011-11-22 MED ORDER — MIDAZOLAM HCL 5 MG/5ML IJ SOLN
INTRAMUSCULAR | Status: DC | PRN
  Administered 2011-11-22 (×2): 1 mg via INTRAVENOUS

## 2011-11-22 MED ORDER — CHLORHEXIDINE GLUCONATE 0.12 % MT SOLN
15.0000 mL | Freq: Two times a day (BID) | OROMUCOSAL | Status: DC
  Administered 2011-11-22 – 2011-11-23 (×2): 15 mL via OROMUCOSAL
  Filled 2011-11-22 (×2): qty 15

## 2011-11-22 MED ORDER — MORPHINE SULFATE 2 MG/ML IJ SOLN
2.0000 mg | INTRAMUSCULAR | Status: DC | PRN
  Administered 2011-11-22: 2 mg via INTRAVENOUS

## 2011-11-22 MED FILL — Verapamil HCl IV Soln 2.5 MG/ML: INTRAVENOUS | Qty: 4 | Status: AC

## 2011-11-22 MED FILL — Magnesium Sulfate Inj 50%: INTRAMUSCULAR | Qty: 10 | Status: AC

## 2011-11-22 MED FILL — Potassium Chloride Inj 2 mEq/ML: INTRAVENOUS | Qty: 40 | Status: AC

## 2011-11-22 MED FILL — Heparin Sodium (Porcine) Inj 1000 Unit/ML: INTRAMUSCULAR | Qty: 10 | Status: AC

## 2011-11-22 MED FILL — Nitroglycerin IV Soln 5 MG/ML: INTRAVENOUS | Qty: 10 | Status: AC

## 2011-11-22 MED FILL — Lactated Ringer's Solution: INTRAVENOUS | Qty: 500 | Status: AC

## 2011-11-22 SURGICAL SUPPLY — 24 items
CATH THORACIC 28FR (CATHETERS) ×3 IMPLANT
CATH THORACIC 36FR (CATHETERS) IMPLANT
CATH TROCAR 32FR (CATHETERS) ×3 IMPLANT
CLOTH BEACON ORANGE TIMEOUT ST (SAFETY) ×3 IMPLANT
CONN Y 3/8X3/8X3/8  BEN (MISCELLANEOUS) ×2
CONN Y 3/8X3/8X3/8 BEN (MISCELLANEOUS) ×1 IMPLANT
GAUZE SPONGE 4X4 16PLY XRAY LF (GAUZE/BANDAGES/DRESSINGS) ×3 IMPLANT
GLOVE BIO SURGEON STRL SZ7.5 (GLOVE) ×6 IMPLANT
GLOVE BIOGEL M 7.0 STRL (GLOVE) ×6 IMPLANT
GOWN PREVENTION PLUS XLARGE (GOWN DISPOSABLE) ×3 IMPLANT
GOWN STRL NON-REIN LRG LVL3 (GOWN DISPOSABLE) ×3 IMPLANT
KIT BASIN OR (CUSTOM PROCEDURE TRAY) ×3 IMPLANT
KIT ROOM TURNOVER OR (KITS) ×3 IMPLANT
NEEDLE 22X1 1/2 (OR ONLY) (NEEDLE) ×3 IMPLANT
NEEDLE HYPO 25GX1X1/2 BEV (NEEDLE) ×3 IMPLANT
PAD ARMBOARD 7.5X6 YLW CONV (MISCELLANEOUS) ×6 IMPLANT
SPONGE GAUZE 4X4 12PLY (GAUZE/BANDAGES/DRESSINGS) ×3 IMPLANT
SUT SILK  1 MH (SUTURE) ×6
SUT SILK 1 MH (SUTURE) ×3 IMPLANT
SYR CONTROL 10ML LL (SYRINGE) ×3 IMPLANT
SYSTEM SAHARA CHEST DRAIN RE-I (WOUND CARE) ×3 IMPLANT
TAPE CLOTH SURG 4X10 WHT LF (GAUZE/BANDAGES/DRESSINGS) ×3 IMPLANT
TOWEL OR 17X24 6PK STRL BLUE (TOWEL DISPOSABLE) ×3 IMPLANT
TRAY CHEST TUBE INSERTION (SET/KITS/TRAYS/PACK) ×3 IMPLANT

## 2011-11-22 NOTE — Progress Notes (Signed)
ABG listed below that was drawn on 3/28 at 1737 was not an arterial blood gas, blood was drawn from a venous source.  MD notified, ABG not ordered or necessary.  Results for [place], H[place]   Ref. Range 11/22/2011 17:37  Sample type No range found ARTERIAL  pH, Arterial Latest Range: 7.350-7.450  7.281 (L)  pCO2 arterial Latest Range: 35.0-45.0 mmHg 62.6 (HH)  pO2, Arterial Latest Range: 80.0-100.0 mmHg 35.0 (LL)  Bicarbonate Latest Range: 20.0-24.0 mEq/L 29.5 (H)  TCO2 Latest Range: 0-100 mmol/L 31  Acid-Base Excess Latest Range: 0.0-2.0 mmol/L 2.0  O2 Saturation No range found 57.0  Patient temperature No range found 98.9 F  Collection site No range found RADIAL, ALLEN'S TEST ACCEPTABLE   SMITH, Jakya Dovidio A, RN

## 2011-11-22 NOTE — Anesthesia Postprocedure Evaluation (Signed)
  Anesthesia Post-op Note  Patient: Marcus [name]  Procedure(s) Performed: Procedure(s) (LRB): CHEST TUBE INSERTION (Right)  Patient Location: SICU  Anesthesia Type: MAC  Level of Consciousness: awake, alert  and oriented  Airway and Oxygen Therapy: Patient Spontanous Breathing and Patient connected to face mask oxygen  Post-op Pain: none  Post-op Assessment: Post-op Vital signs reviewed, Patient's Cardiovascular Status Stable, Respiratory Function Stable, Patent Airway, No signs of Nausea or vomiting and Adequate PO intake  Post-op Vital Signs: Reviewed and stable  Complications: No apparent anesthesia complications

## 2011-11-22 NOTE — Progress Notes (Signed)
Marcus [name] is experiencing an intolerable amount of pain. Not breathing well on a 50% VM with POx dropping into the 80s since he will not breathe d/t pain. Dr Donata Clay called in OR to obtain different pain medication orders. New orders for Fentanyl and Oxycodone obtained. Morphine was ineffective and was D/C'd.

## 2011-11-22 NOTE — OR Nursing (Signed)
Called SICU to notify of transport time at [phone]

## 2011-11-22 NOTE — OR Nursing (Signed)
SICU notified @1617  that the patient was returning to the unit post-operative

## 2011-11-22 NOTE — Progress Notes (Signed)
Charting done by SN, Berniece Pap, has been verified by myself concerning Mr. Goehring care for today.  This includes, medications, assessment, and hourly charting.    Larina Bras A RN

## 2011-11-22 NOTE — Progress Notes (Signed)
Physical Therapy Cancellation Note: order received, chart reviewed. Pt currently OOR for chest tube placement. Will attempt tomorrow. Thanks Delaney Meigs, PT [phone]

## 2011-11-22 NOTE — Brief Op Note (Signed)
11/15/2011 - 11/22/2011  4:22 PM  PATIENT:  Marcus [name]  78 y.o. male  PRE-OPERATIVE DIAGNOSIS:  Right Pleural Effusion  POST-OPERATIVE DIAGNOSIS:  Right Pleural effusion  PROCEDURE:  Procedure(s) (LRB): CHEST TUBE INSERTION (Right)32 F chest tube  SURGEON:  Surgeon(s) and Role:    * Kerin Perna, MD - Primary  PHYSICIAN ASSISTANT: none    ANESTHESIA: IV versed  EBL:  Total I/O In: 680.8 [P.O.:360; I.V.:320.8] Out: 610 [Urine:560; Chest Tube:50]  BLOOD ADMINISTERED:none  DRAINS: 32 F chest tube  LOCAL MEDICATIONS USED:  {10 cc 1% lidocaine  SPECIMEN:none  DISPOSITION OF SPECIMEN: none  COUNTS: none  TOURNIQUET:  DICTATION: .pending  PLAN OF CARE: Admit to inpatient   PATIENT DISPOSITION:  ICU - extubated and stable.   Delay start of Pharmacological VTE agent (>24hrs) due to surgical blood loss or risk of bleeding: {yes

## 2011-11-22 NOTE — Progress Notes (Signed)
The patient was examined and preop studies reviewed. There has been no change from the prior exam and the patient is ready for surgery.  Plan R chest tube placement for postop R pleural effusion

## 2011-11-22 NOTE — Progress Notes (Addendum)
Patient transported to OR for R CT placement.  Patient on venturi mask and connected to monitor during transport.  IV meds and patient chart sent to OR.  Report given at bedside to CRNA in holding room.  Keitha Butte, RN

## 2011-11-22 NOTE — Transfer of Care (Signed)
Immediate Anesthesia Transfer of Care Note  Patient: Marcus [name]  Procedure(s) Performed: Procedure(s) (LRB): CHEST TUBE INSERTION (Right)  Patient Location: SICU  Anesthesia Type: MAC  Level of Consciousness: awake and alert   Airway & Oxygen Therapy: Patient Spontanous Breathing and Patient connected to face mask oxygen  Post-op Assessment: Report given to PACU RN, Post -op Vital signs reviewed and stable and Patient moving all extremities  Post vital signs: Reviewed and stable  Complications: No apparent anesthesia complications

## 2011-11-22 NOTE — Anesthesia Preprocedure Evaluation (Addendum)
Anesthesia Evaluation  Patient identified by MRN, date of birth, ID band Patient awake    Reviewed: Allergy & Precautions, H&P , NPO status , Patient's Chart, lab work & pertinent test results, reviewed documented beta blocker date and time   History of Anesthesia Complications Negative for: history of anesthetic complications  Airway Mallampati: II TM Distance: >3 FB Neck ROM: Full    Dental  (+) Edentulous Upper and Edentulous Lower   Pulmonary shortness of breath,    Pulmonary exam normal + decreased breath sounds (decreased on R)      Cardiovascular hypertension, Pt. on medications and Pt. on home beta blockers + angina + CAD (s/p CABG 2 days ago), + Past MI and + DOE + dysrhythmias Atrial Fibrillation Rhythm:Irregular Rate:Normal  ECHO 3/13- normal LVF   Neuro/Psych negative neurological ROS     GI/Hepatic Neg liver ROS,   Endo/Other  Morbid obesity  Renal/GU Renal InsufficiencyRenal disease (creat 1.74)negative Renal ROS     Musculoskeletal   Abdominal (+) + obese,   Peds  Hematology Hb 8.8, plts 97K   Anesthesia Other Findings   Reproductive/Obstetrics                         Anesthesia Physical Anesthesia Plan  ASA: IV  Anesthesia Plan: MAC   Post-op Pain Management:    Induction:   Airway Management Planned: Simple Face Mask  Additional Equipment:   Intra-op Plan:   Post-operative Plan:   Informed Consent: I have reviewed the patients History and Physical, chart, labs and discussed the procedure including the risks, benefits and alternatives for the proposed anesthesia with the patient or authorized representative who has indicated his/her understanding and acceptance.     Plan Discussed with: CRNA and Surgeon  Anesthesia Plan Comments: (Plan routine monitors, MAC)        Anesthesia Quick Evaluation

## 2011-11-23 ENCOUNTER — Inpatient Hospital Stay (HOSPITAL_COMMUNITY): Payer: Medicare Other

## 2011-11-23 DIAGNOSIS — I10 Essential (primary) hypertension: Secondary | ICD-10-CM

## 2011-11-23 LAB — CBC
Hemoglobin: 7.6 g/dL — ABNORMAL LOW (ref 13.0–17.0)
Platelets: 105 10*3/uL — ABNORMAL LOW (ref 150–400)
RBC: 2.53 MIL/uL — ABNORMAL LOW (ref 4.22–5.81)
WBC: 8.6 10*3/uL (ref 4.0–10.5)

## 2011-11-23 LAB — GLUCOSE, CAPILLARY
Glucose-Capillary: 101 mg/dL — ABNORMAL HIGH (ref 70–99)
Glucose-Capillary: 107 mg/dL — ABNORMAL HIGH (ref 70–99)
Glucose-Capillary: 110 mg/dL — ABNORMAL HIGH (ref 70–99)
Glucose-Capillary: 115 mg/dL — ABNORMAL HIGH (ref 70–99)
Glucose-Capillary: 124 mg/dL — ABNORMAL HIGH (ref 70–99)

## 2011-11-23 LAB — PREPARE RBC (CROSSMATCH)

## 2011-11-23 LAB — BASIC METABOLIC PANEL
Calcium: 7.7 mg/dL — ABNORMAL LOW (ref 8.4–10.5)
GFR calc Af Amer: 43 mL/min — ABNORMAL LOW (ref >90)
GFR calc non Af Amer: 37 mL/min — ABNORMAL LOW (ref >90)
Potassium: 4.9 mEq/L (ref 3.5–5.1)
Sodium: 139 mEq/L (ref 135–145)

## 2011-11-23 MED ORDER — FUROSEMIDE 10 MG/ML IJ SOLN
40.0000 mg | Freq: Every day | INTRAMUSCULAR | Status: DC
  Administered 2011-11-23 – 2011-11-24 (×2): 40 mg via INTRAVENOUS
  Filled 2011-11-23 (×2): qty 4

## 2011-11-23 MED ORDER — TRAMADOL HCL 50 MG PO TABS: 50.0000 mg | ORAL_TABLET | Freq: Four times a day (QID) | ORAL | Status: DC | PRN

## 2011-11-23 MED ORDER — SODIUM CHLORIDE 0.45 % IV SOLN
INTRAVENOUS | Status: AC
  Administered 2011-11-24: 01:00:00 via INTRAVENOUS

## 2011-11-23 MED ORDER — FUROSEMIDE 10 MG/ML IJ SOLN: 40.0000 mg | Freq: Once | INTRAMUSCULAR | Status: DC

## 2011-11-23 NOTE — Progress Notes (Signed)
All charting, assessments, patient care, and medication administration given/provided by SN Amy, V-Smith has been supervised and verified to be accurate by myself.

## 2011-11-23 NOTE — Progress Notes (Signed)
Patient ID: Marcus [name], male   DOB: 1932/10/10, 78 y.o.   MRN: [id number]   Filed Vitals:   11/23/11 1600 11/23/11 1700 11/23/11 1800 11/23/11 1900  BP: 99/46 110/50    Pulse: 71 71 77 70  Temp:      TempSrc:      Resp: 20 19 21 17   Height:      Weight:      SpO2: 95% 96% 95% 97%   Good urine output  CBC    Component Value Date/Time   WBC 8.6 11/23/2011 0400   RBC 2.53* 11/23/2011 0400   HGB 7.6* 11/23/2011 0400   HCT 23.5* 11/23/2011 0400   PLT 105* 11/23/2011 0400   MCV 92.9 11/23/2011 0400   MCH 30.0 11/23/2011 0400   MCHC 32.3 11/23/2011 0400   RDW 14.5 11/23/2011 0400   LYMPHSABS 1.2 11/15/2011 0856   MONOABS 0.6 11/15/2011 0856   EOSABS 0.1 11/15/2011 0856   BASOSABS 0.0 11/15/2011 0856    BMET    Component Value Date/Time   NA 139 11/23/2011 0400   K 4.9 11/23/2011 0400   CL 101 11/23/2011 0400   CO2 31 11/23/2011 0400   GLUCOSE 111* 11/23/2011 0400   BUN 28* 11/23/2011 0400   CREATININE 1.71* 11/23/2011 0400   CALCIUM 7.7* 11/23/2011 0400   GFRNONAA 37* 11/23/2011 0400   GFRAA 43* 11/23/2011 0400    A/P:  Stable day

## 2011-11-23 NOTE — Progress Notes (Signed)
TELEMETRY: Reviewed telemetry pt in NSR with PVCs, brief period of Afib early this morning.: Filed Vitals:   11/23/11 1200 11/23/11 1245 11/23/11 1300 11/23/11 1315  BP: 109/47  108/45   Pulse: 72 73 73 70  Temp:  97.7 F (36.5 C)  98.1 F (36.7 C)  TempSrc:  Oral  Axillary  Resp: 23 14 15 15   Height:      Weight:      SpO2: 98%  95%     Intake/Output Summary (Last 24 hours) at 11/23/11 1405 Last data filed at 11/23/11 1315  Gross per 24 hour  Intake 1055.28 ml  Output   4735 ml  Net -3679.72 ml    SUBJECTIVE Complains of feeling weak. Gets SOB with talking. No pain.  LABS: Basic Metabolic Panel:  Basename 11/23/11 0400 11/22/11 1730 11/21/11 1657 11/21/11 0239  NA 139 137 -- --  K 4.9 4.3 -- --  CL 101 100 -- --  CO2 31 29 -- --  GLUCOSE 111* 129* -- --  BUN 28* 27* -- --  CREATININE 1.71* 1.69* -- --  CALCIUM 7.7* 8.7 -- --  MG -- -- 2.0 2.1  PHOS -- -- -- --   Liver Function Tests: No results found for this basename: AST:2,ALT:2,ALKPHOS:2,BILITOT:2,PROT:2,ALBUMIN:2 in the last 72 hours No results found for this basename: LIPASE:2,AMYLASE:2 in the last 72 hours CBC:  Basename 11/23/11 0400 11/22/11 0355  WBC 8.6 9.0  NEUTROABS -- --  HGB 7.6* 8.8*  HCT 23.5* 26.4*  MCV 92.9 91.7  PLT 105* 97*   Cardiac Enzymes: No results found for this basename: CKTOTAL:3,CKMB:3,CKMBINDEX:3,TROPONINI:3 in the last 72 hours BNP: No components found with this basename: POCBNP:3 D-Dimer: No results found for this basename: DDIMER:2 in the last 72 hours Hemoglobin A1C: No results found for this basename: HGBA1C in the last 72 hours Fasting Lipid Panel: No results found for this basename: CHOL,HDL,LDLCALC,TRIG,CHOLHDL,LDLDIRECT in the last 72 hours Thyroid Function Tests: No results found for this basename: TSH,T4TOTAL,FREET3,T3FREE,THYROIDAB in the last 72 hours Anemia Panel: No results found for this basename: VITAMINB12,FOLATE,FERRITIN,TIBC,IRON,RETICCTPCT in  the last 72 hours  Radiology/Studies:   11/23/2011  *RADIOLOGY REPORT*  Clinical Data: Chest tube.  Open heart surgery.  PORTABLE CHEST - 1 VIEW  Comparison: 11/22/2011  Findings: Right jugular sheath remains in the SVC.  Bilateral chest tubes remain in place.  No pneumothorax.  Hypoventilation with moderate bibasilar atelectasis/infiltrate is unchanged.  IMPRESSION: No significant change in significant bibasilar airspace disease and hypoventilation.  Original Report Authenticated By: Camelia Phenes, M.D.     PHYSICAL EXAM General: Elderly, obese, in no acute distress. Head: Normocephalic, atraumatic, sclera non-icteric, no xanthomas, nares are without discharge. Neck: Negative for carotid bruits. JVD not elevated. Lungs: Diminished breath sounds bilaterally. Chest tubes in place. Breathing is unlabored. Heart: RRR S1 S2 without murmurs, rubs, or gallops.  Abdomen: Obese,Soft, non-tender, non-distended with normoactive bowel sounds. No hepatomegaly. No rebound/guarding. No obvious abdominal masses. Msk:  Strength and tone appears normal for age. Extremities: trace-1+ edema.  Distal pedal pulses are 2+ and equal bilaterally. Neuro: Alert and oriented X 3. Moves all extremities spontaneously. Psych:  Responds to questions appropriately with a normal affect.  ASSESSMENT AND PLAN: 1. Severe 3 vessel CAD now post op day 3 CABG 2. Atrial fibrillation present on admission. Some recurrence post op. On Amiodarone/ metoprolol. S/p Maze procedure. Agree that he is poor coumadin candidate. 3. Acute renal insufficiency- creatnine 1.7. Baseline creatnine 0.95.  4. Anemia due to  acute blood loss with surgery. Being transfused today. 5. Obesity with hypoventilation. 6. Pleural effusion chest tubes in place.    Plan: continue ASA, beta blocker, amio, diuresis.  Principal Problem:  *Atrial fibrillation with RVR Active Problems:  Pure hypercholesterolemia  HTN (hypertension)  Chest pain     Signed, Gil Ingwersen [place] MD, [facility] 11/23/2011 2:15 PM

## 2011-11-23 NOTE — Evaluation (Signed)
Physical Therapy Evaluation Patient Details Name: Marcus [name] MRN: [id number] DOB: May 28, 1933 Today's Date: 11/23/2011  Problem List:  Patient Active Problem List  Diagnoses  . Pure hypercholesterolemia  . CORONARY ATHEROSCLEROSIS NATIVE CORONARY ARTERY  . Abnormal cardiovascular function study  . HTN (hypertension)  . Atrial fibrillation with RVR  . Chest pain    Past Medical History:  Past Medical History  Diagnosis Date  . Coronary atherosclerosis of native coronary artery   . Pure hypercholesterolemia   . Nonspecific abnormal unspecified cardiovascular function study   . Other and unspecified hyperlipidemia   . Hypertension   . Gout   . Angina   . Myocardial infarction   . Dysrhythmia     atrial fibrilation  . Shortness of breath   . Chronic kidney disease     hx of BPH  . Arthritis    Past Surgical History:  Past Surgical History  Procedure Date  . Cataracts   . Coronary artery bypass graft 11/20/2011    Procedure: CORONARY ARTERY BYPASS GRAFTING (CABG);  Surgeon: Kerin Perna, MD;  Location: [facility] OR;  Service: Open Heart Surgery;  Laterality: N/A;  Times 3. On Pump. Using endoscopically harvested right greater saphenous vein and left internal mammary artery.   . Maze 11/20/2011    Procedure: MAZE;  Surgeon: Kerin Perna, MD;  Location: [facility] OR;  Service: Open Heart Surgery;  Laterality: N/A;    PT Assessment/Plan/Recommendation PT Assessment Clinical Impression Statement: Pt s/p CABG and chest tube placement s/p post op repiratory insufficiency. Pt with history of Right hemidiaphragm paralysis. Pt will benefit from acute therapy to maximize mobility, gait, transfers, independence to return pt to PLOF and decrease burden of care. Pt educated for sternal precautions.  PT Recommendation/Assessment: Patient will need skilled PT in the acute care venue PT Problem List: Decreased activity tolerance;Decreased mobility;Decreased knowledge of precautions;Decreased  knowledge of use of DME;Cardiopulmonary status limiting activity PT Therapy Diagnosis : Difficulty walking;Abnormality of gait PT Plan PT Frequency: Min 3X/week PT Treatment/Interventions: Gait training;DME instruction;Functional mobility training;Therapeutic activities;Therapeutic exercise;Patient/family education PT Recommendation Recommendations for Other Services: OT consult Follow Up Recommendations: Home health PT with Supervision for mobility/OOB if pt able to meet goals Equipment Recommended: Rolling walker with 5" wheels PT Goals  Acute Rehab PT Goals PT Goal Formulation: With patient Time For Goal Achievement: 2 weeks Pt will go Supine/Side to Sit: with supervision PT Goal: Supine/Side to Sit - Progress: Goal set today Pt will go Sit to Supine/Side: with supervision PT Goal: Sit to Supine/Side - Progress: Goal set today Pt will go Sit to Stand: with supervision PT Goal: Sit to Stand - Progress: Goal set today Pt will go Stand to Sit: with supervision PT Goal: Stand to Sit - Progress: Goal set today Pt will Ambulate: >150 feet;with least restrictive assistive device;with supervision PT Goal: Ambulate - Progress: Goal set today Pt will Go Up / Down Stairs: 1-2 stairs;with min assist;with least restrictive assistive device PT Goal: Up/Down Stairs - Progress: Goal set today Additional Goals Additional Goal #1: Pt will independently state and demonstrate 3/3 sternal precautions. PT Goal: Additional Goal #1 - Progress: Goal set today  PT Evaluation Precautions/Restrictions  Precautions Precautions: Sternal;Fall Precaution Comments: mulitple lines and bil chest tubes Prior Functioning  Home Living Lives With: Spouse Type of Home: Mobile home Home Layout: One level Home Access: Stairs to enter Entrance Stairs-Number of Steps: 1 Bathroom Shower/Tub: Engineer, manufacturing systems: Standard Home Adaptive Equipment: None Prior Function Level of  Independence: Independent  with basic ADLs;Independent with transfers;Independent with homemaking with ambulation;Independent with gait Driving: Yes Vocation: Full time employment Cognition Cognition Arousal/Alertness: Awake/alert Overall Cognitive Status: Appears within functional limits for tasks assessed Orientation Level: Oriented X4 Sensation/Coordination Sensation Light Touch: Appears Intact Extremity Assessment RLE Assessment RLE Assessment: Within Functional Limits LLE Assessment LLE Assessment: Within Functional Limits Mobility (including Balance) Bed Mobility Bed Mobility: Yes Rolling Right: 2: Max assist Rolling Right Details (indicate cue type and reason): max cueing to sequenc roll as pt trying to scoot to EOB and pivot Right Sidelying to Sit: 1: +2 Total assist;Patient percentage (comment);HOB elevated (comment degrees) (HOB 15degrees, pt = 50%) Sitting - Scoot to Edge of Bed: 3: Mod assist Sitting - Scoot to Edge of Bed Details (indicate cue type and reason): assist with pad for reciprocal scooting with cueing to complete Sit to Sidelying Right: HOB flat;1: +2 Total assist;Patient percentage (comment) (pt=60%) Sit to Sidelying Right Details (indicate cue type and reason): cueing for sequence and assist for bil LE onto surface Transfers Transfers: Yes Sit to Stand: 4: Min assist;From bed Sit to Stand Details (indicate cue type and reason): cueing for hands on thighs, anterior shift and sequence Stand to Sit: 4: Min assist;To bed Stand to Sit Details: cueing to sequence and control descent with hands on thighs Ambulation/Gait Ambulation/Gait: Yes Ambulation/Gait Assistance: 4: Min assist Ambulation/Gait Assistance Details (indicate cue type and reason): +1 for lines and safety. Cueing to step closer to [facility] and for safety and control when steering Ambulation Distance (Feet): 100 Feet Assistive device: Other (Comment) (WC used secondary to bil chest tubes) Gait Pattern: Step-through  pattern;Decreased stride length;Trunk flexed Stairs: No  Posture/Postural Control Posture/Postural Control: No significant limitations Exercise    End of Session PT - End of Session Equipment Utilized During Treatment: Gait belt Activity Tolerance: Patient limited by fatigue Patient left: in bed;with call bell in reach (returned to bed for chest tube removal) Nurse Communication: Mobility status for transfers;Mobility status for ambulation General Behavior During Session: [facility] for tasks performed Cognition: [facility] for tasks performed  Delorse Lek 11/23/2011, 3:21 PM  Delaney Meigs, PT [phone]
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
## 2011-11-23 NOTE — Progress Notes (Signed)
At 0140 Mr. Siegert went into Afib RVR rate 120-146 bpm. POx 99% on 15L NRB. BP stable and unchanged. 5 mg IV metoprolol given per standing orders. Dopamine decreased to 58mcg/kg/min. Dr Donata Clay called to make aware and while on the phone at 0156 patient converted to NSR with a HR in the 70s. Orders received to decrease the Dopamine to 2.38mcg/kg/min. Patient asleep and unsymptomatic at this time. Will continue to monitor.

## 2011-11-23 NOTE — Op Note (Signed)
NAME:  Lim, Eural                 ACCOUNT NO.:  [id number]  MEDICAL RECORD NO.:  [id number]  LOCATION:  2310                         FACILITY:  [facility]  PHYSICIAN:  Kerin Perna, M.D.  DATE OF BIRTH:  December 19, 1932  DATE OF PROCEDURE:  11/22/2011 DATE OF DISCHARGE:                              OPERATIVE REPORT   OPERATION:  Placement of right chest tube.  PREOPERATIVE DIAGNOSIS:  Right pleural effusion, status post coronary artery bypass graft, maze procedure.  POSTOPERATIVE DIAGNOSIS:  Right pleural effusion, status post coronary artery bypass graft, maze procedure.  SURGEON:  Kerin Perna, MD  ANESTHESIA:  Local 1% lidocaine with IV conscious sedation by Anesthesia.  PROCEDURE:  The patient is a 78 year old gentleman who underwent multivessel bypass grafting and combined maze procedure 40 hours previously.  He has a chronically elevated right hemidiaphragm, and his postop chest x-ray showed significant pleural effusion, which combined with the paralyzed diaphragm caused significant respiratory difficulties, requiring BiPAP.  Chest tube placement was recommended for drainage of the effusion, and the patient understood and accepted.  The patient was brought to the operative room, placed supine on the operating room table.  A proper time-out was performed.  The right chest was prepped and draped as a sterile field.  Lidocaine 1% was infiltrated in the right inframammary crease.  An 1-inch incision was made. Lidocaine was infiltrated down to the chest wall.  The right pleural space was entered with a hemostat and bloody fluid drained.  A 32-French chest tube was placed into the pleural space and directed to the apex. It was secured to the skin.  It drained over a liter of a bloody fluid from the pleural space.  A chest x-ray was performed in the OR, which confirmed proper placement and good evacuation of the fluid.  Sterile dressing was placed.  The chest tube was connected  to an underwater seal Pleur-evac system.  The patient was returned to the ICU.     Kerin Perna, M.D.     PV/MEDQ  D:  11/22/2011  T:  11/23/2011  Job:  [id number]

## 2011-11-23 NOTE — Progress Notes (Signed)
UR Completed.  Marcus [name] Jane [phone] 11/23/2011  

## 2011-11-23 NOTE — Progress Notes (Signed)
Marcus [name] RR has dropped to 12-14 where as it was higher earlier. Slightly lethargic, but arousable. Will put on the EtCO2 to assess where his CO2 levels are while resting to better evaluate need for BiPAP without ABG.

## 2011-11-24 ENCOUNTER — Inpatient Hospital Stay (HOSPITAL_COMMUNITY): Payer: Medicare Other

## 2011-11-24 LAB — TYPE AND SCREEN
ABO/RH(D): A POS
Antibody Screen: NEGATIVE
Unit division: 0

## 2011-11-24 LAB — COMPREHENSIVE METABOLIC PANEL
ALT: 17 U/L (ref 0–53)
AST: 32 U/L (ref 0–37)
Albumin: 2.5 g/dL — ABNORMAL LOW (ref 3.5–5.2)
Alkaline Phosphatase: 48 U/L (ref 39–117)
BUN: 31 mg/dL — ABNORMAL HIGH (ref 6–23)
CO2: 33 mEq/L — ABNORMAL HIGH (ref 19–32)
Calcium: 8.6 mg/dL (ref 8.4–10.5)
Chloride: 99 mEq/L (ref 96–112)
Creatinine, Ser: 1.5 mg/dL — ABNORMAL HIGH (ref 0.50–1.35)
GFR calc Af Amer: 50 mL/min — ABNORMAL LOW (ref >90)
GFR calc non Af Amer: 43 mL/min — ABNORMAL LOW (ref >90)
Glucose, Bld: 85 mg/dL (ref 70–99)
Potassium: 3.7 mEq/L (ref 3.5–5.1)
Sodium: 137 mEq/L (ref 135–145)
Total Bilirubin: 0.5 mg/dL (ref 0.3–1.2)
Total Protein: 5.8 g/dL — ABNORMAL LOW (ref 6.0–8.3)

## 2011-11-24 LAB — URINE CULTURE
Colony Count: NO GROWTH
Culture  Setup Time: 201303291341
Culture: NO GROWTH

## 2011-11-24 LAB — CBC
HCT: 23.2 % — ABNORMAL LOW (ref 39.0–52.0)
Hemoglobin: 7.6 g/dL — ABNORMAL LOW (ref 13.0–17.0)
MCH: 30.4 pg (ref 26.0–34.0)
MCHC: 32.8 g/dL (ref 30.0–36.0)
MCV: 92.8 fL (ref 78.0–100.0)
Platelets: 137 10*3/uL — ABNORMAL LOW (ref 150–400)
RBC: 2.5 MIL/uL — ABNORMAL LOW (ref 4.22–5.81)
RDW: 15.1 % (ref 11.5–15.5)
WBC: 6.4 10*3/uL (ref 4.0–10.5)

## 2011-11-24 LAB — GLUCOSE, CAPILLARY
Glucose-Capillary: 89 mg/dL (ref 70–99)
Glucose-Capillary: 86 mg/dL (ref 70–99)
Glucose-Capillary: 97 mg/dL (ref 70–99)
Glucose-Capillary: 106 mg/dL — ABNORMAL HIGH (ref 70–99)

## 2011-11-24 MED ORDER — FUROSEMIDE 10 MG/ML IJ SOLN
40.0000 mg | Freq: Two times a day (BID) | INTRAMUSCULAR | Status: DC
  Administered 2011-11-24 – 2011-11-25 (×2): 40 mg via INTRAVENOUS
  Filled 2011-11-24 (×3): qty 4

## 2011-11-24 MED ORDER — POTASSIUM CHLORIDE 10 MEQ/50ML IV SOLN
INTRAVENOUS | Status: AC
  Filled 2011-11-24: qty 150

## 2011-11-24 MED ORDER — FUROSEMIDE 10 MG/ML IJ SOLN
40.0000 mg | Freq: Two times a day (BID) | INTRAMUSCULAR | Status: DC
  Filled 2011-11-24 (×2): qty 4

## 2011-11-24 MED ORDER — POTASSIUM CHLORIDE 10 MEQ/50ML IV SOLN
10.0000 meq | INTRAVENOUS | Status: AC | PRN
  Administered 2011-11-24 (×3): 10 meq via INTRAVENOUS

## 2011-11-24 MED ORDER — POTASSIUM CHLORIDE CRYS ER 20 MEQ PO TBCR
20.0000 meq | EXTENDED_RELEASE_TABLET | Freq: Two times a day (BID) | ORAL | Status: DC
  Administered 2011-11-24 – 2011-11-30 (×12): 20 meq via ORAL
  Filled 2011-11-24 (×13): qty 1

## 2011-11-24 MED ORDER — FERROUS GLUCONATE 324 (38 FE) MG PO TABS
324.0000 mg | ORAL_TABLET | Freq: Two times a day (BID) | ORAL | Status: DC
  Administered 2011-11-24 – 2011-11-30 (×12): 324 mg via ORAL
  Filled 2011-11-24 (×15): qty 1

## 2011-11-24 NOTE — Progress Notes (Addendum)
2 Days Post-Op Procedure(s) (LRB): CHEST TUBE INSERTION (Right) Subjective: No complaints  Objective: Vital signs in last 24 hours: Temp:  [98.2 F (36.8 C)-99 F (37.2 C)] 99 F (37.2 C) (03/30 1533) Pulse Rate:  [65-79] 72  (03/30 1300) Cardiac Rhythm:  [-] Normal sinus rhythm (03/30 1200) Resp:  [15-23] 22  (03/30 1300) BP: (107-159)/(41-68) 140/62 mmHg (03/30 1300) SpO2:  [91 %-100 %] 99 % (03/30 1300) Weight:  [129.6 kg (285 lb 11.5 oz)] 129.6 kg (285 lb 11.5 oz) (03/30 0500)  Hemodynamic parameters for last 24 hours:    Intake/Output from previous day: 03/29 0701 - 03/30 0700 In: 829.3 [I.V.:508.3; Blood:313; IV Piggyback:8] Out: 2600 [Urine:2200; Chest Tube:400] Intake/Output this shift: Total I/O In: 810 [P.O.:550; I.V.:60; IV Piggyback:200] Out: 295 [Urine:295]  General appearance: alert and cooperative Heart: regular rate and rhythm, S1, S2 normal, no murmur, click, rub or gallop Lungs: clear to auscultation bilaterally Extremities: edema mild Wound: incisions ok  Lab Results:  Basename 11/24/11 0415 11/23/11 0400  WBC 6.4 8.6  HGB 7.6* 7.6*  HCT 23.2* 23.5*  PLT 137* 105*   BMET:  Basename 11/24/11 0415 11/23/11 0400  NA 137 139  K 3.7 4.9  CL 99 101  CO2 33* 31  GLUCOSE 85 111*  BUN 31* 28*  CREATININE 1.50* 1.71*  CALCIUM 8.6 7.7*    PT/INR: No results found for this basename: LABPROT,INR in the last 72 hours ABG    Component Value Date/Time   PHART 7.281* 11/22/2011 1737   HCO3 29.5* 11/22/2011 1737   TCO2 31 11/22/2011 1737   ACIDBASEDEF 3.0* 11/21/2011 1228   O2SAT 57.0 11/22/2011 1737   CBG (last 3)   Basename 11/24/11 1532 11/24/11 1129 11/24/11 0742  GLUCAP 106* 97 86    Assessment/Plan: S/P Procedure(s) (LRB): CHEST TUBE INSERTION (Right) Mobilize Diuresis DC chest tube Acute blood loss anemia:  No change in Hgb after transfusion yesterday. Will start Fe2+. Keep foley in for diuresis.   LOS: 9 days    Marcus [name]  K 11/24/2011

## 2011-11-24 NOTE — Progress Notes (Signed)
All charting, medications, assessments and patient care by SN Amy V-Smith has been supervised by this RN from 1900 3/29 through 0700 3/29

## 2011-11-25 ENCOUNTER — Inpatient Hospital Stay (HOSPITAL_COMMUNITY): Payer: Medicare Other

## 2011-11-25 LAB — BASIC METABOLIC PANEL
BUN: 22 mg/dL (ref 6–23)
GFR calc Af Amer: 66 mL/min — ABNORMAL LOW (ref >90)
GFR calc non Af Amer: 57 mL/min — ABNORMAL LOW (ref >90)
Potassium: 3.7 mEq/L (ref 3.5–5.1)
Sodium: 137 mEq/L (ref 135–145)

## 2011-11-25 LAB — GLUCOSE, CAPILLARY
Glucose-Capillary: 101 mg/dL — ABNORMAL HIGH (ref 70–99)
Glucose-Capillary: 102 mg/dL — ABNORMAL HIGH (ref 70–99)
Glucose-Capillary: 79 mg/dL (ref 70–99)
Glucose-Capillary: 90 mg/dL (ref 70–99)

## 2011-11-25 LAB — CBC
MCHC: 32.7 g/dL (ref 30.0–36.0)
RDW: 14.8 % (ref 11.5–15.5)

## 2011-11-25 MED ORDER — FUROSEMIDE 40 MG PO TABS
40.0000 mg | ORAL_TABLET | Freq: Every day | ORAL | Status: DC
  Administered 2011-11-26 – 2011-11-27 (×2): 40 mg via ORAL
  Filled 2011-11-25 (×3): qty 1

## 2011-11-25 MED ORDER — SODIUM CHLORIDE 0.45 % IV SOLN: INTRAVENOUS | Status: DC

## 2011-11-25 MED ORDER — POTASSIUM CHLORIDE 10 MEQ/50ML IV SOLN
INTRAVENOUS | Status: AC
  Administered 2011-11-25: 10 meq via INTRAVENOUS
  Filled 2011-11-25: qty 150

## 2011-11-25 MED ORDER — POTASSIUM CHLORIDE 10 MEQ/50ML IV SOLN
10.0000 meq | INTRAVENOUS | Status: AC | PRN
  Administered 2011-11-25 (×3): 10 meq via INTRAVENOUS

## 2011-11-25 NOTE — Progress Notes (Signed)
3 Days Post-Op Procedure(s) (LRB): CHEST TUBE INSERTION (Right) Subjective: No complaints   Objective: Vital signs in last 24 hours: Temp:  [98 F (36.7 C)-98.9 F (37.2 C)] 98.7 F (37.1 C) (03/31 1554) Pulse Rate:  [66-82] 79  (03/31 1500) Cardiac Rhythm:  [-] Normal sinus rhythm (03/31 1200) Resp:  [17-23] 23  (03/31 1500) BP: (87-163)/(48-77) 163/58 mmHg (03/31 1500) SpO2:  [94 %-99 %] 96 % (03/31 1500)  Hemodynamic parameters for last 24 hours:    Intake/Output from previous day: 03/30 0701 - 03/31 0700 In: 1818 [P.O.:950; I.V.:460; IV Piggyback:408] Out: 3320 [Urine:3270; Chest Tube:50] Intake/Output this shift: Total I/O In: 890 [P.O.:700; I.V.:140; IV Piggyback:50] Out: 1400 [Urine:1400]  General appearance: alert and cooperative Heart: regular rate and rhythm, S1, S2 normal, no murmur, click, rub or gallop Lungs: clear to auscultation bilaterally Extremities: edema mild Wound: incision ok  Lab Results:  Basename 11/25/11 0401 11/24/11 0415  WBC 5.7 6.4  HGB 8.1* 7.6*  HCT 24.8* 23.2*  PLT 177 137*   BMET:  Basename 11/25/11 0401 11/24/11 0415  NA 137 137  K 3.7 3.7  CL 96 99  CO2 35* 33*  GLUCOSE 85 85  BUN 22 31*  CREATININE 1.19 1.50*  CALCIUM 8.7 8.6    PT/INR: No results found for this basename: LABPROT,INR in the last 72 hours ABG    Component Value Date/Time   PHART 7.281* 11/22/2011 1737   HCO3 29.5* 11/22/2011 1737   TCO2 31 11/22/2011 1737   ACIDBASEDEF 3.0* 11/21/2011 1228   O2SAT 57.0 11/22/2011 1737   CBG (last 3)   Basename 11/25/11 1133 11/25/11 0758 11/25/11 0356  GLUCAP 101* 102* 79    Assessment/Plan: S/P Procedure(s) (LRB): CHEST TUBE INSERTION (Right) Mobilize Diuresis DC foley and sleeve. Should be able to go to 2000 tomorrow, then home.   LOS: 10 days    Keondrick Dilks K 11/25/2011

## 2011-11-26 LAB — GLUCOSE, CAPILLARY

## 2011-11-26 LAB — CBC
HCT: 24.9 % — ABNORMAL LOW (ref 39.0–52.0)
Hemoglobin: 8 g/dL — ABNORMAL LOW (ref 13.0–17.0)
MCH: 29.7 pg (ref 26.0–34.0)
MCHC: 32.1 g/dL (ref 30.0–36.0)
MCV: 92.6 fL (ref 78.0–100.0)
Platelets: 209 10*3/uL (ref 150–400)
RBC: 2.69 MIL/uL — ABNORMAL LOW (ref 4.22–5.81)
RDW: 14.5 % (ref 11.5–15.5)
WBC: 5.9 10*3/uL (ref 4.0–10.5)

## 2011-11-26 LAB — BASIC METABOLIC PANEL
BUN: 18 mg/dL (ref 6–23)
CO2: 36 mEq/L — ABNORMAL HIGH (ref 19–32)
Calcium: 8.8 mg/dL (ref 8.4–10.5)
Chloride: 98 mEq/L (ref 96–112)
Creatinine, Ser: 1.05 mg/dL (ref 0.50–1.35)
GFR calc Af Amer: 77 mL/min — ABNORMAL LOW (ref >90)
GFR calc non Af Amer: 66 mL/min — ABNORMAL LOW (ref >90)
Glucose, Bld: 90 mg/dL (ref 70–99)
Potassium: 3.8 mEq/L (ref 3.5–5.1)
Sodium: 138 mEq/L (ref 135–145)

## 2011-11-26 MED ORDER — INSULIN ASPART 100 UNIT/ML ~~LOC~~ SOLN
0.0000 [IU] | Freq: Three times a day (TID) | SUBCUTANEOUS | Status: DC
  Administered 2011-11-28: 2 [IU] via SUBCUTANEOUS

## 2011-11-26 MED ORDER — SODIUM CHLORIDE 0.9 % IJ SOLN
3.0000 mL | Freq: Two times a day (BID) | INTRAMUSCULAR | Status: DC
  Administered 2011-11-26 – 2011-11-30 (×9): 3 mL via INTRAVENOUS

## 2011-11-26 MED ORDER — SODIUM CHLORIDE 0.9 % IJ SOLN: 3.0000 mL | INTRAMUSCULAR | Status: DC | PRN

## 2011-11-26 MED ORDER — SIMVASTATIN 20 MG PO TABS: 20.0000 mg | ORAL_TABLET | Freq: Every day | ORAL | Status: DC

## 2011-11-26 MED ORDER — SODIUM CHLORIDE 0.9 % IV SOLN
250.0000 mL | INTRAVENOUS | Status: DC | PRN
  Administered 2011-11-27: 250 mL via INTRAVENOUS

## 2011-11-26 MED ORDER — MOVING RIGHT ALONG BOOK
Freq: Once | Status: AC
  Administered 2011-11-26: 17:00:00
  Filled 2011-11-26: qty 1

## 2011-11-26 MED ORDER — LACTULOSE 10 GM/15ML PO SOLN
30.0000 g | Freq: Every day | ORAL | Status: DC | PRN
  Administered 2011-11-26: 30 g via ORAL
  Filled 2011-11-26 (×3): qty 45

## 2011-11-26 MED FILL — Heparin Sodium (Porcine) Inj 1000 Unit/ML: INTRAMUSCULAR | Qty: 10 | Status: AC

## 2011-11-26 MED FILL — Lidocaine HCl IV Inj 20 MG/ML: INTRAVENOUS | Qty: 10 | Status: AC

## 2011-11-26 MED FILL — Sodium Chloride IV Soln 0.9%: INTRAVENOUS | Qty: 1000 | Status: AC

## 2011-11-26 MED FILL — Sodium Chloride Irrigation Soln 0.9%: Qty: 3000 | Status: AC

## 2011-11-26 MED FILL — Sodium Bicarbonate IV Soln 8.4%: INTRAVENOUS | Qty: 50 | Status: AC

## 2011-11-26 MED FILL — Electrolyte-R (PH 7.4) Solution: INTRAVENOUS | Qty: 3000 | Status: AC

## 2011-11-26 MED FILL — Mannitol IV Soln 20%: INTRAVENOUS | Qty: 500 | Status: AC

## 2011-11-26 MED FILL — Heparin Sodium (Porcine) Inj 1000 Unit/ML: INTRAMUSCULAR | Qty: 30 | Status: AC

## 2011-11-26 NOTE — Progress Notes (Signed)
Physical Therapy Treatment Patient Details Name: KERMITT HARJO MRN: [id number] DOB: 1932-10-03 Today's Date: 11/26/2011  PT Assessment/Plan Comments on Treatment Session: Pt progressing well. Discussed that he did not use a device prior to admission and will attempt gait with no device on next visit to assess needs (? may assist pt with energy conservation vs is using more energy to push w/c). Pt agreeable to this plan. PT Plan: Discharge plan remains appropriate;Frequency remains appropriate PT Frequency: Min 3X/week Recommendations for Other Services: OT consult Follow Up Recommendations: Home health PT;Supervision for mobility/OOB Equipment Recommended: Other (comment) (TBA) PT Goals  Acute Rehab PT Goals PT Goal: Sit to Stand - Progress: Progressing toward goal PT Goal: Stand to Sit - Progress: Progressing toward goal PT Goal: Ambulate - Progress: Progressing toward goal Additional Goals PT Goal: Additional Goal #1 - Progress: Progressing toward goal  PT Treatment Precautions/Restrictions  Precautions Precautions: Sternal;Fall Precaution Comments: pt able to verbalize and demonstrate sternal precautions Restrictions Weight Bearing Restrictions: No Mobility (including Balance) Bed Mobility Bed Mobility: No Transfers Sit to Stand: Other (comment);Without upper extremity assist;From chair/3-in-1 Sit to Stand Details (indicate cue type and reason): minguard assist pt holding hands out in front of him (without cues); pt did require cues to scoot to front edge of chair prior to attempting to stand Stand to Sit: Other (comment);To chair/3-in-1;Without upper extremity assist Stand to Sit Details: minguard assist for safety; cues to not reach for armrests to maintain sternal precautions Ambulation/Gait Ambulation/Gait Assistance: 4: Min assist Ambulation/Gait Assistance Details (indicate cue type and reason): pt pushing w/c due to multiple lines/O2; limited weight-bearing on arms with  no unsteadiness noted; pt denied unsteadiness and reported shortness of breath as limiting factor Ambulation Distance (Feet): 300 Feet (with 3 standing rest breaks due to dyspnea) Assistive device: Other (Comment) (w/c) Gait Pattern: Step-through pattern;Decreased stride length (decr velocity due to respiratory issues)    Exercise    End of Session PT - End of Session Equipment Utilized During Treatment: Gait belt Activity Tolerance: Patient limited by fatigue Patient left: in chair;with call bell in reach;with family/visitor present Nurse Communication: Mobility status for ambulation General Behavior During Session: [facility] for tasks performed Cognition: [facility] for tasks performed  Avni Traore 11/26/2011, 1:53 PM Pager [phone]

## 2011-11-26 NOTE — Progress Notes (Signed)
4 Days Post-Op Procedure(s) (LRB): CHEST TUBE INSERTION (Right) SubjectivCABG/MAZE Postop deconditioning,edema,anemia  Objective: Vital signs in last 24 hours: Temp:  [98.4 F (36.9 C)-98.9 F (37.2 C)] 98.5 F (36.9 C) (04/01 0727) Pulse Rate:  [65-82] 71  (04/01 0700) Cardiac Rhythm:  [-] Normal sinus rhythm (04/01 0700) Resp:  [16-23] 22  (04/01 0700) BP: (113-163)/(46-87) 144/64 mmHg (04/01 0700) SpO2:  [93 %-99 %] 96 % (04/01 0814) Weight:  [281 lb 8.4 oz (127.7 kg)] 281 lb 8.4 oz (127.7 kg) (04/01 0600)  Hemodynamic parameters for last 24 hours:  NSR  Intake/Output from previous day: 03/31 0701 - 04/01 0700 In: 910 [P.O.:700; I.V.:160; IV Piggyback:50] Out: 2825 [Urine:2825] Intake/Output this shift:    ALERT Lungs cleaedema less  Lab Results:  Basename 11/26/11 0400 11/25/11 0401  WBC 5.9 5.7  HGB 8.0* 8.1*  HCT 24.9* 24.8*  PLT 209 177   BMET:  Basename 11/26/11 0400 11/25/11 0401  NA 138 137  K 3.8 3.7  CL 98 96  CO2 36* 35*  GLUCOSE 90 85  BUN 18 22  CREATININE 1.05 1.19  CALCIUM 8.8 8.7    PT/INR: No results found for this basename: LABPROT,INR in the last 72 hours ABG    Component Value Date/Time   PHART 7.281* 11/22/2011 1737   HCO3 29.5* 11/22/2011 1737   TCO2 31 11/22/2011 1737   ACIDBASEDEF 3.0* 11/21/2011 1228   O2SAT 57.0 11/22/2011 1737   CBG (last 3)   Basename 11/26/11 0725 11/26/11 0454 11/25/11 2318  GLUCAP 84 88 86    Assessment/Plan: S/P Procedure(s) (LRB): CHEST TUBE INSERTION (Right) Plan for transfer to step-down: see transfer orders NO COUMADIN  LOS: 11 days    VAN TRIGT III,Shakeerah Gradel 11/26/2011

## 2011-11-26 NOTE — Progress Notes (Signed)
Cardiac Rehab 1430 Offered to walk with pt. States he has walked twice today and would like to wait for his third walk. We will follow up tomorrow. Caitlin Hillmer DunlapRN

## 2011-11-26 NOTE — Progress Notes (Signed)
UR Completed.  Dheeraj Hail Jane [phone] 11/26/2011  

## 2011-11-26 NOTE — Progress Notes (Signed)
   CARE MANAGEMENT NOTE 11/26/2011  Patient:  Marcus [name], Marcus [name]   Account Number:  [id number]  Date Initiated:  11/19/2011  Documentation initiated by:  GRAVES-BIGELOW,BRENDA  Subjective/Objective Assessment:   Pt admitted with cp and a fib with rvr. Plan for CABG with maze procedure 11-20-11.     Action/Plan:   PTA, PT INDEPENDENT, LIVES WITH SPOUSE.   Anticipated DC Date:  11/28/2011   Anticipated DC Plan:  HOME W HOME HEALTH SERVICES      DC Planning Services  CM consult      Choice offered to / List presented to:             Status of service:  In process, will continue to follow Medicare Important Message given?   (If response is "NO", the following Medicare IM given date fields will be blank) Date Medicare IM given:   Date Additional Medicare IM given:    Discharge Disposition:    Per UR Regulation:  Reviewed for med. necessity/level of care/duration of stay  If discussed at Long Length of Stay Meetings, dates discussed:    Comments:  11/26/11 Marcus Evinger,RN,BSN 1330 MET WITH PT AND WIFE TO DISCUSS DC PLANS.  WIFE TO PROVIDE 24HR CARE AT DISCHARGE.  THEY HAVE A NEIGHBOR WHO CAN ASSIST AS NEEDED.  WILL FOLLOW FOR HOME NEEDS AS PT PROGRESSES. Phone #[phone]   11-19-11 [address] [place] [phone] CM will continue to monitor for d/c disposition needs.

## 2011-11-26 NOTE — Progress Notes (Signed)
Transferred to 2002 via wheelchair, portable monitor and oxygen at 2 liters/ per Edgar. No changes.

## 2011-11-27 ENCOUNTER — Inpatient Hospital Stay (HOSPITAL_COMMUNITY): Payer: Medicare Other

## 2011-11-27 ENCOUNTER — Other Ambulatory Visit: Payer: Self-pay

## 2011-11-27 ENCOUNTER — Encounter (HOSPITAL_COMMUNITY): Payer: Self-pay | Admitting: Cardiothoracic Surgery

## 2011-11-27 LAB — BASIC METABOLIC PANEL
BUN: 18 mg/dL (ref 6–23)
CO2: 36 mEq/L — ABNORMAL HIGH (ref 19–32)
Calcium: 9 mg/dL (ref 8.4–10.5)
Chloride: 98 mEq/L (ref 96–112)
Creatinine, Ser: 1.11 mg/dL (ref 0.50–1.35)
GFR calc Af Amer: 72 mL/min — ABNORMAL LOW (ref >90)
GFR calc non Af Amer: 62 mL/min — ABNORMAL LOW (ref >90)
Glucose, Bld: 97 mg/dL (ref 70–99)
Potassium: 3.9 mEq/L (ref 3.5–5.1)
Sodium: 140 mEq/L (ref 135–145)

## 2011-11-27 LAB — CBC
HCT: 26.1 % — ABNORMAL LOW (ref 39.0–52.0)
Hemoglobin: 8.6 g/dL — ABNORMAL LOW (ref 13.0–17.0)
MCH: 30.8 pg (ref 26.0–34.0)
MCHC: 33 g/dL (ref 30.0–36.0)
MCV: 93.5 fL (ref 78.0–100.0)
Platelets: 257 10*3/uL (ref 150–400)
RBC: 2.79 MIL/uL — ABNORMAL LOW (ref 4.22–5.81)
RDW: 14.4 % (ref 11.5–15.5)
WBC: 6.4 10*3/uL (ref 4.0–10.5)

## 2011-11-27 LAB — GLUCOSE, CAPILLARY
Glucose-Capillary: 92 mg/dL (ref 70–99)
Glucose-Capillary: 89 mg/dL (ref 70–99)

## 2011-11-27 MED ORDER — DILTIAZEM LOAD VIA INFUSION
5.0000 mg | Freq: Once | INTRAVENOUS | Status: AC
  Administered 2011-11-27: 5 mg via INTRAVENOUS
  Filled 2011-11-27: qty 5

## 2011-11-27 MED ORDER — METOPROLOL TARTRATE 25 MG/10 ML ORAL SUSPENSION
12.5000 mg | Freq: Two times a day (BID) | ORAL | Status: DC
  Administered 2011-11-27: 12.5 mg
  Filled 2011-11-27 (×7): qty 5

## 2011-11-27 MED ORDER — DILTIAZEM HCL 100 MG IV SOLR
5.0000 mg/h | INTRAVENOUS | Status: DC
  Administered 2011-11-27: 5 mg/h via INTRAVENOUS
  Filled 2011-11-27 (×2): qty 100

## 2011-11-27 MED ORDER — AMLODIPINE BESYLATE 5 MG PO TABS
5.0000 mg | ORAL_TABLET | Freq: Every day | ORAL | Status: DC
  Administered 2011-11-27 – 2011-11-30 (×4): 5 mg via ORAL
  Filled 2011-11-27 (×4): qty 1

## 2011-11-27 MED ORDER — METOPROLOL TARTRATE 25 MG PO TABS
25.0000 mg | ORAL_TABLET | Freq: Two times a day (BID) | ORAL | Status: DC
  Administered 2011-11-27 – 2011-11-30 (×6): 25 mg via ORAL
  Filled 2011-11-27 (×7): qty 1

## 2011-11-27 NOTE — Progress Notes (Addendum)
[name] 129 afib. Assisted pt to BR on O2. HR up to 153 Afib. Got increase dose of Lopressor only 20 min ago. RN asks to hold ambulation for now. Got pt out of BR as well. SaO2 94 on 2L and BP 100/64. Only c/o is weakness. To recliner. Will f/u am. [phone] Ethelda Chick CES, ACSM

## 2011-11-27 NOTE — Progress Notes (Addendum)
Subjective:  Patient complains of shortness of breath  Objective:  Vital Signs in the last 24 hours: Temp:  [98.2 F (36.8 C)-98.8 F (37.1 C)] 98.2 F (36.8 C) (04/02 0427) Pulse Rate:  [72-113] 75  (04/02 0427) Resp:  [18-24] 20  (04/02 0427) BP: (116-169)/(55-87) 167/87 mmHg (04/02 0427) SpO2:  [89 %-98 %] 94 % (04/02 0427) Weight:  [279 lb 15.8 oz (127 kg)] 279 lb 15.8 oz (127 kg) (04/02 0427)  Intake/Output from previous day: 04/01 0701 - 04/02 0700 In: 1133 [P.O.:1130; I.V.:3] Out: 1570 [Urine:1570] Intake/Output from this shift:    Physical Exam: General appearance: alert and no distress Lungs: clear to auscultation bilaterally Heart: regular rate and rhythm, S1, S2 normal, no murmur, click, rub or gallop Abdomen: soft, non-tender; bowel sounds normal; no masses,  no organomegaly Extremities: edema 2+ Skin: incisions healing well, no erythema or drainage present  Lab Results:  Basename 11/27/11 0635 11/26/11 0400  WBC 6.4 5.9  HGB 8.6* 8.0*  PLT 257 209    Basename 11/27/11 0635 11/26/11 0400  NA 140 138  K 3.9 3.8  CL 98 98  CO2 36* 36*  GLUCOSE 97 90  BUN 18 18  CREATININE 1.11 1.05   No results found for this basename: TROPONINI:2,CK,MB:2 in the last 72 hours Hepatic Function Panel No results found for this basename: PROT,ALBUMIN,AST,ALT,ALKPHOS,BILITOT,BILIDIR,IBILI in the last 72 hours No results found for this basename: CHOL in the last 72 hours No results found for this basename: PROTIME in the last 72 hours  Imaging: Imaging results have been reviewed: right sided atelectasis with possible [place] right sided pleural effusion  Assessment/Plan:   1. S/P CABG 2. S/P Right chest tube insertion 3. CV- NSR, HTN running 160s, will restart home Norvasc 4. Resp- patient short of breath, lungs CTA, encourage IS, may benefit from nebulizer treatments prn, wean oxygen as tolerated 5. CBGs- well controlled, will continue current regimen   LOS: 12 days      BARRETT, ERIN 11/27/2011, 8:01 AM    No sign of recurrent R pl effusion on CXR [place] recurrent Afib but not coumadin candidate

## 2011-11-27 NOTE — Progress Notes (Signed)
Pt in Aflutter 2:1 ratio at 128 bpm; 2nd Ekg done indicating Atrial Flutter at 126. Gershon Crane, PAC updated.

## 2011-11-27 NOTE — Progress Notes (Signed)
Physical Therapy Treatment Patient Details Name: Marcus [name] MRN: [id number] DOB: 1933/05/20 Today's Date: 11/27/2011  PT Assessment/Plan  PT - Assessment/Plan Comments on Treatment Session: Pt safer when using RW, at least for now. He will need one for d/c and he is agreeable.  PT Plan: Discharge plan remains appropriate;Frequency remains appropriate Follow Up Recommendations: Home health PT;Supervision for mobility/OOB Equipment Recommended: Rolling walker with 5" wheels PT Goals  Acute Rehab PT Goals Pt will go Sit to Stand: with modified independence;without upper extremity assist PT Goal: Sit to Stand - Progress: Updated due to goal met Pt will go Stand to Sit: with modified independence;without upper extremity assist PT Goal: Stand to Sit - Progress: Updated due to goals met PT Goal: Ambulate - Progress: Progressing toward goal  PT Treatment Precautions/Restrictions  Precautions Precautions: Fall;Sternal Precaution Comments: pt able to verbalize and demonstrate sternal precautions Restrictions Weight Bearing Restrictions: No Mobility (including Balance) Bed Mobility Bed Mobility: No (pt upright in recliner) Transfers Sit to Stand: 5: Supervision;With armrests;From chair/3-in-1 (pt used LUE on arm rest to assist with sit->stand) Sit to Stand Details (indicate cue type and reason): cues for safe hand placement Stand to Sit: 5: Supervision;Without upper extremity assist;To chair/3-in-1 Stand to Sit Details: cues for hands in lap for sternal precautions Ambulation/Gait Ambulation/Gait Assistance Details (indicate cue type and reason): amb. with mingaurd-minA, slow but steady; cues for rest breaks as pt became more SOB; sats fluctuated between mid 80s-92% on 2 liters during ambulation although some of the readings were questionable ; cues for technique with RW (attempted ambulation without RW but pt unsteady and c/o wobbly legs, better with RW) Ambulation Distance (Feet): 310  Feet Assistive device: Rolling walker Gait Pattern: Trunk flexed  Posture/Postural Control Posture/Postural Control: No significant limitations Balance Balance Assessed: Yes Static Standing Balance Static Standing - Balance Support: No upper extremity supported Static Standing - Level of Assistance: 5: Stand by assistance Exercise  General Exercises - Lower Extremity Ankle Circles/Pumps: AROM;Seated (30 reps) Long Arc Quad: AROM;Both;10 reps;Seated Hip Flexion/Marching: AROM;Seated;Both;Other reps (comment) (20 reps) End of Session PT - End of Session Equipment Utilized During Treatment: Gait belt Activity Tolerance: Patient tolerated treatment well Patient left: in chair;with call bell in reach General Behavior During Session: [facility] for tasks performed Cognition: [facility] for tasks performed  [facility] HELEN 11/27/2011, 9:26 AM

## 2011-11-28 LAB — GLUCOSE, CAPILLARY
Glucose-Capillary: 96 mg/dL (ref 70–99)
Glucose-Capillary: 98 mg/dL (ref 70–99)
Glucose-Capillary: 95 mg/dL (ref 70–99)
Glucose-Capillary: 128 mg/dL — ABNORMAL HIGH (ref 70–99)

## 2011-11-28 MED ORDER — FUROSEMIDE 40 MG PO TABS
40.0000 mg | ORAL_TABLET | Freq: Two times a day (BID) | ORAL | Status: DC
  Administered 2011-11-28 – 2011-11-30 (×4): 40 mg via ORAL
  Filled 2011-11-28 (×7): qty 1

## 2011-11-28 MED ORDER — LISINOPRIL 2.5 MG PO TABS
2.5000 mg | ORAL_TABLET | Freq: Every day | ORAL | Status: DC
Start: 2011-11-28 — End: 2011-11-30
  Administered 2011-11-28 – 2011-11-30 (×3): 2.5 mg via ORAL
  Filled 2011-11-28 (×3): qty 1

## 2011-11-28 NOTE — Progress Notes (Signed)
Subjective:  Marcus [name] states he doesn't feel well this morning.  However, he cant say specifically what is bothering him.  Objective:  Vital Signs in the last 24 hours: Temp:  [98.4 F (36.9 C)-99 F (37.2 C)] 98.4 F (36.9 C) (04/03 0523) Pulse Rate:  [122-130] 126  (04/03 0523) Resp:  [16-20] 20  (04/03 0523) BP: (111-152)/(60-87) 111/73 mmHg (04/03 0523) SpO2:  [88 %-97 %] 93 % (04/03 0759) FiO2 (%):  [2 %] 2 % (04/02 1819) Weight:  [276 lb 3.8 oz (125.3 kg)] 276 lb 3.8 oz (125.3 kg) (04/03 0523)  Intake/Output from previous day: 04/02 0701 - 04/03 0700 In: 1080 [P.O.:1080] Out: 900 [Urine:900] Intake/Output from this shift:    Physical Exam: General appearance: alert, cooperative and no distress Lungs: clear to auscultation bilaterally Heart: irregularly irregular rhythm Abdomen: soft, non-tender; bowel sounds normal; no masses,  no organomegaly Extremities: edema 1 Skin: Skin color, texture, turgor normal. No rashes or lesions or incisions healing well, no erythema or drainage present  Lab Results:  Basename 11/27/11 0635 11/26/11 0400  WBC 6.4 5.9  HGB 8.6* 8.0*  PLT 257 209    Basename 11/27/11 0635 11/26/11 0400  NA 140 138  K 3.9 3.8  CL 98 98  CO2 36* 36*  GLUCOSE 97 90  BUN 18 18  CREATININE 1.11 1.05   No results found for this basename: TROPONINI:2,CK,MB:2 in the last 72 hours Hepatic Function Panel No results found for this basename: PROT,ALBUMIN,AST,ALT,ALKPHOS,BILITOT,BILIDIR,IBILI in the last 72 hours No results found for this basename: CHOL in the last 72 hours No results found for this basename: PROTIME in the last 72 hours  Assessment/Plan:   1. S/P CABG 2. S/P Right chest tube insertion 3. CV- developed A. Fib yesterday, remains there this morning rate 120s.  He is currently on Cardizem drip.  Patient is not a candidate from Coumadin, per Dr. Donata Clay will attempt to pacing patient to see if able to convert back to NSR 4. Resp- right  pleural effusion resolved, lungs CTA, encourage use of IS 5. CBGs- under good control, continue insulin regimen 6. Continue to monitor   LOS: 13 days    Marcus [name] 11/28/2011, 8:09 AM

## 2011-11-28 NOTE — Progress Notes (Signed)
    Subjective:  Feels better. No chest pain. Dyspnea improving.  Objective:  Vital Signs in the last 24 hours: Temp:  [98.4 F (36.9 C)-99 F (37.2 C)] 98.4 F (36.9 C) (04/03 0523) Pulse Rate:  [75-132] 75  (04/03 0915) Resp:  [16-22] 18  (04/03 0915) BP: (111-143)/(52-83) 129/53 mmHg (04/03 0915) SpO2:  [91 %-97 %] 92 % (04/03 0915) FiO2 (%):  [2 %] 2 % (04/02 1819) Weight:  [125.3 kg (276 lb 3.8 oz)] 125.3 kg (276 lb 3.8 oz) (04/03 0523)  Intake/Output from previous day: 04/02 0701 - 04/03 0700 In: 1080 [P.O.:1080] Out: 900 [Urine:900]  Physical Exam: Pt is alert and oriented, obese male in NAD HEENT: normal Neck: JVP - normal, carotids 2+= without bruits Lungs: CTA bilaterally CV: RRR without murmur or gallop Abd: soft, NT, Positive BS, no hepatomegaly Ext: trace pretib edema bilaterally, distal pulses intact and equal Skin: warm/dry no rash  Lab Results:  Basename 11/27/11 0635 11/26/11 0400  WBC 6.4 5.9  HGB 8.6* 8.0*  PLT 257 209    Basename 11/27/11 0635 11/26/11 0400  NA 140 138  K 3.9 3.8  CL 98 98  CO2 36* 36*  GLUCOSE 97 90  BUN 18 18  CREATININE 1.11 1.05   No results found for this basename: TROPONINI:2,CK,MB:2 in the last 72 hours Tele: atrial flutter to sinus rhythm this am 0700, maintaining sinus  Assessment/Plan:  1. NSTEMI - s/p CABG 2. Atrial fib/flutter, now in sinus 3. HTN 4. Hyperlipidemia  Agree with current Rx. Pt on appropriate with amiodarone, ASA, metoprolol, and statin. Will add low-dose ACE-I today in setting of diabetes, CAD, and HTN.   Tonny Bollman, M.D. 11/28/2011, 1:19 PM

## 2011-11-28 NOTE — Progress Notes (Signed)
CARDIAC REHAB PHASE I   PRE:  Rate/Rhythm: 67SR  BP:  Supine:   Sitting: 112/60  Standing:    SaO2: 96%2L, 93%RA  MODE:  Ambulation: 340 ft   POST:  Rate/Rhythem: 87  BP:  Supine:   Sitting: 128/50  Standing:    SaO2: 89%RA room 91-92%RA hall 1305-1322 Pt walked 340 ft on RA with rolling walker and asst x 2. Gait steady. Checked RA sats in hall at 170 ft and sats at 91-92%RA.  After walk sats were 89%RA. Left off oxygen. Tolerated well. Can be asst x 1 next walk. To chair. Notified NT to check sats  Again on RA.  Duanne Limerick

## 2011-11-29 LAB — GLUCOSE, CAPILLARY
Glucose-Capillary: 92 mg/dL (ref 70–99)
Glucose-Capillary: 86 mg/dL (ref 70–99)
Glucose-Capillary: 82 mg/dL (ref 70–99)

## 2011-11-29 MED ORDER — FERROUS GLUCONATE 324 (38 FE) MG PO TABS: 324.0000 mg | ORAL_TABLET | Freq: Two times a day (BID) | ORAL | Status: DC

## 2011-11-29 MED ORDER — AMIODARONE HCL 400 MG PO TABS: 400.0000 mg | ORAL_TABLET | Freq: Two times a day (BID) | ORAL | Status: DC

## 2011-11-29 MED ORDER — ASPIRIN 325 MG PO TBEC: 325.0000 mg | DELAYED_RELEASE_TABLET | Freq: Every day | ORAL | Status: AC

## 2011-11-29 MED ORDER — METOPROLOL TARTRATE 25 MG PO TABS: 25.0000 mg | ORAL_TABLET | Freq: Two times a day (BID) | ORAL | Status: DC

## 2011-11-29 MED ORDER — OXYCODONE HCL 5 MG PO TABS: 5.0000 mg | ORAL_TABLET | ORAL | Status: AC | PRN

## 2011-11-29 MED ORDER — LISINOPRIL 2.5 MG PO TABS: 2.5000 mg | ORAL_TABLET | Freq: Every day | ORAL | Status: DC

## 2011-11-29 MED ORDER — FUROSEMIDE 40 MG PO TABS: 40.0000 mg | ORAL_TABLET | Freq: Two times a day (BID) | ORAL | Status: DC

## 2011-11-29 MED ORDER — POTASSIUM CHLORIDE CRYS ER 20 MEQ PO TBCR: 20.0000 meq | EXTENDED_RELEASE_TABLET | Freq: Two times a day (BID) | ORAL | Status: DC

## 2011-11-29 MED ORDER — DOXAZOSIN MESYLATE 4 MG PO TABS: 4.0000 mg | ORAL_TABLET | Freq: Every day | ORAL | Status: DC

## 2011-11-29 MED ORDER — TRAMADOL HCL 50 MG PO TABS: 50.0000 mg | ORAL_TABLET | Freq: Four times a day (QID) | ORAL | Status: AC | PRN

## 2011-11-29 MED ORDER — FLUTICASONE-SALMETEROL 250-50 MCG/DOSE IN AEPB: 1.0000 | INHALATION_SPRAY | Freq: Two times a day (BID) | RESPIRATORY_TRACT | Status: DC

## 2011-11-29 NOTE — Progress Notes (Signed)
Cardiac Rehab 1435 Pt walked with PT this morning and his RN this afternoon . We will followup tomorrow. Marcus [name] DunlapRN

## 2011-11-29 NOTE — Progress Notes (Signed)
11/29/2011 2:28 PM Nursing Note Patient ambulated 217ft with rolling walker, rn and on 2l o2 [place]. Patient tolerated well. Encouraged one more walk this evening.  Toluwanimi Radebaugh, Blanchard Kelch

## 2011-11-29 NOTE — Progress Notes (Signed)
Physical Therapy Treatment Patient Details Name: Marcus [name] MRN: [id number] DOB: [birth year] Today's Date: 11/29/2011  PT Assessment/Plan  PT - Assessment/Plan Comments on Treatment Session: Pt progressing well s/p CABG, wife and pt educated for sternal precautions and transfers. Pt states he continues to feel more stable with RW for ambulation. Will continue to follow to maximize mobility before discharge.  PT Plan: Discharge plan remains appropriate PT Goals  Acute Rehab PT Goals PT Goal: Supine/Side to Sit - Progress: Met PT Goal: Sit to Supine/Side - Progress: Met PT Goal: Sit to Stand - Progress: Progressing toward goal PT Goal: Stand to Sit - Progress: Progressing toward goal Pt will Ambulate: >150 feet;with modified independence;with least restrictive assistive device PT Goal: Ambulate - Progress: Updated due to goal met PT Goal: Up/Down Stairs - Progress: Met Additional Goals PT Goal: Additional Goal #1 - Progress: Progressing toward goal  PT Treatment Precautions/Restrictions  Precautions Precautions: Sternal Precaution Comments: pt needed cueing for precautions Restrictions Weight Bearing Restrictions: No Mobility (including Balance) Bed Mobility Bed Mobility: Yes Rolling Right: 6: Modified independent (Device/Increase time) Right Sidelying to Sit: 6: Modified independent (Device/Increase time);HOB flat Sitting - Scoot to Edge of Bed: 6: Modified independent (Device/Increase time) Sit to Sidelying Right: 6: Modified independent (Device/Increase time);HOB flat Transfers Sit to Stand: 5: Supervision;From bed;From chair/3-in-1;From toilet Sit to Stand Details (indicate cue type and reason): x 3 trials with cueing for hand placement Stand to Sit: 5: Supervision;To bed;To chair/3-in-1;To toilet Stand to Sit Details: cueing for hand placement Ambulation/Gait Ambulation/Gait Assistance: 5: Supervision Ambulation/Gait Assistance Details (indicate cue type and reason):  cueing to step into the RW and directional cues. Pt sats 90-92 on 2L throughout Ambulation Distance (Feet): 400 Feet Assistive device: Rolling walker Gait Pattern: Step-through pattern;Decreased stride length Stairs: Yes Stairs Assistance: 6: Modified independent (Device/Increase time) Stair Management Technique: Two rails Number of Stairs: 3  Height of Stairs: 8   Posture/Postural Control Posture/Postural Control: No significant limitations Exercise  General Exercises - Lower Extremity Long Arc Quad: AROM;Both;20 reps;Seated Hip Flexion/Marching: AROM;Both;Other reps (comment);Seated (20reps) End of Session PT - End of Session Equipment Utilized During Treatment: Gait belt Activity Tolerance: Patient tolerated treatment well Patient left: in chair;with call bell in reach;with family/visitor present Nurse Communication: Mobility status for transfers;Mobility status for ambulation General Behavior During Session: [facility] for tasks performed Cognition: [facility] for tasks performed  Delorse Lek 11/29/2011, 10:23 AM Delaney Meigs, PT [phone]
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
## 2011-11-29 NOTE — Progress Notes (Signed)
UR Completed.  Brayant Dorr Jane [phone] 11/29/2011  

## 2011-11-29 NOTE — Progress Notes (Signed)
11/29/2011 4:26 PM Nursing note Patient ambulated 250 ft with nurse tech, rolling walker and on 2L o2 [place]. This was pt. Third walk for the day. Patient tolerated well. Will continue to monitor.  Latica Hohmann, Blanchard Kelch

## 2011-11-29 NOTE — Progress Notes (Signed)
    Subjective:  Feels better. No chest pain. Dyspnea improving. Maintaining NSR since being paced out of atrial flutter yesterday.  Objective:  Vital Signs in the last 24 hours: Temp:  [97.7 F (36.5 C)-99 F (37.2 C)] 97.7 F (36.5 C) (04/04 0740) Pulse Rate:  [65-77] 71  (04/04 0740) Resp:  [18-22] 18  (04/04 0740) BP: (112-129)/(52-72) 129/66 mmHg (04/04 0740) SpO2:  [91 %-92 %] 91 % (04/04 0740) Weight:  [124.9 kg (275 lb 5.7 oz)] 124.9 kg (275 lb 5.7 oz) (04/04 0607)  Intake/Output from previous day: 04/03 0701 - 04/04 0700 In: 360 [P.O.:360] Out: 401 [Urine:400; Stool:1]  Physical Exam: Pt is alert and oriented, obese male in NAD HEENT: normal Neck: JVP - normal, carotids 2+= without bruits Lungs: CTA bilaterally CV: RRR without murmur or gallop Abd: soft, NT, Positive BS, no hepatomegaly Ext: trace pretib edema bilaterally, distal pulses intact and equal Skin: warm/dry no rash  Lab Results:  Basename 11/27/11 0635  WBC 6.4  HGB 8.6*  PLT 257    Basename 11/27/11 0635  NA 140  K 3.9  CL 98  CO2 36*  GLUCOSE 97  BUN 18  CREATININE 1.11   No results found for this basename: TROPONINI:2,CK,MB:2 in the last 72 hours Tele: atrial flutter to sinus rhythm this am 0700, maintaining sinus  Assessment/Plan:  1. NSTEMI - s/p CABG 2. Atrial fib/flutter, now in sinus 3. HTN 4. Hyperlipidemia  Agree with current Rx. Pt on appropriate with amiodarone, ASA, metoprolol, and statin. Now on ACEi.  Will check BMET in am  Cassell Clement, M.D. 11/29/2011, 8:53 AM

## 2011-11-29 NOTE — Progress Notes (Addendum)
Subjective:   Mr. Morici has no complaints this morning.  He was weaned off his oxygen yesterday.  He is anxious to get home.  Objective:  Vital Signs in the last 24 hours: Temp:  [97.7 F (36.5 C)-99 F (37.2 C)] 97.7 F (36.5 C) (04/04 0740) Pulse Rate:  [65-132] 71  (04/04 0740) Resp:  [18-22] 18  (04/04 0740) BP: (112-129)/(52-72) 129/66 mmHg (04/04 0740) SpO2:  [91 %-92 %] 91 % (04/04 0740) Weight:  [275 lb 5.7 oz (124.9 kg)] 275 lb 5.7 oz (124.9 kg) (04/04 0607)  Intake/Output from previous day: 04/03 0701 - 04/04 0700 In: 360 [P.O.:360] Out: 401 [Urine:400; Stool:1] Intake/Output from this shift:    Physical Exam: General appearance: alert, cooperative and no distress Lungs: clear to auscultation bilaterally Heart: regular rate and rhythm Abdomen: soft, non-tender; bowel sounds normal; no masses,  no organomegaly Extremities: edema trace Skin: incisions healing well, with no evidence of infection  Lab Results:  Basename 11/27/11 0635  WBC 6.4  HGB 8.6*  PLT 257    Basename 11/27/11 0635  NA 140  K 3.9  CL 98  CO2 36*  GLUCOSE 97  BUN 18  CREATININE 1.11   No results found for this basename: TROPONINI:2,CK,MB:2 in the last 72 hours Hepatic Function Panel No results found for this basename: PROT,ALBUMIN,AST,ALT,ALKPHOS,BILITOT,BILIDIR,IBILI in the last 72 hours No results found for this basename: CHOL in the last 72 hours No results found for this basename: PROTIME in the last 72 hours  Assessment/Plan:   1. S/P CABG doing better 2. S/P Right sided chest tube insertion 3. CV- patient developed A. Fib/Flutter yesterday, patient's external pacemaker was turned on to try and convert patient to NSR, which was successful.  Patient remains in NSR this morning.  Cardiology evaluated patient yesterday and added low dose ACE-I to regimen of Metoprolol and Amiodarone.  Patient is tolerating well 4. Resp- no acute issues, continue IS 5. CBGs- remain well  controlled, continue insulin regimen 6. Dispo- patient is doing well this morning.  If he remains in NSR, may be able to d/c EPW in the next 24-48 hours.  If no further arrythmia develops possible d/c home this weekend or early next week   LOS: 14 days    BARRETT, ERIN 11/29/2011, 8:15 AM    patient examined and medical record reviewed,agree with above noteDC FRI if he maintains NSR. VAN TRIGT III,Shail Urbas 11/29/2011

## 2011-11-29 NOTE — Discharge Instructions (Signed)
Coronary Artery Bypass Grafting Care After Refer to this sheet in the next few weeks. These instructions provide you with information on caring for yourself after your procedure. Your caregiver may also give you more specific instructions. Your treatment has been planned according to current medical practices, but problems sometimes occur. Call your caregiver if you have any problems or questions after your procedure.  Recovery from open heart surgery will be different for everyone. Some people feel well after 3 or 4 weeks, while for others it takes longer. After heart surgery, it may be normal to:  Not have an appetite, feel nauseated by the smell of food, or only want to eat a Bernhard amount.   Be constipated because of changes in your diet, activity, and medicines. Eat foods high in fiber. Add fresh fruits and vegetables to your diet. Stool softeners may be helpful.   Feel sad or unhappy. You may be frustrated or cranky. You may have good days and bad days. Do not give up. Talk to your caregiver if you do not feel better.   Feel weakness and fatigue. You many need physical therapy or cardiac rehabilitation to get your strength back.   Develop an irregular heartbeat called atrial fibrillation. Symptoms of atrial fibrillation are a fast, irregular heartbeat or feelings of fluttery heartbeats, shortness of breath, low blood pressure, and dizziness. If these symptoms develop, see your caregiver right away.  MEDICATION  Have a list of all the medicines you will be taking when you leave the hospital. For every medicine, know the following:   Name.   Exact dose.   Time of day to be taken.   How often it should be taken.   Why you are taking it.   Ask which medicines should or should not be taken together. If you take more than one heart medicine, ask if it is okay to take them together. Some heart medicines should not be taken at the same time because they may lower your blood pressure too  much.   Narcotic pain medicine can cause constipation. Eat fresh fruits and vegetables. Add fiber to your diet. Stool softener medicine may help relieve constipation.   Keep a copy of your medicines with you at all times.   Do not add or stop taking any medicine until you check with your caregiver.   Medicines can have side effects. Call your caregiver who prescribed the medicine if you:   Start throwing up, have diarrhea, or have stomach pain.   Feel dizzy or lightheaded when you stand up.   Feel your heart is skipping beats or is beating too fast or too slow.   Develop a rash.   Notice unusual bruising or bleeding.  HOME CARE INSTRUCTIONS  After heart surgery, it is important to learn how to take your pulse. Have your caregiver show you how to take your pulse.   Use your incentive spirometer. Ask your caregiver how long after surgery you need to use it.  Care of your chest incision  Tell your caregiver right away if you notice clicking in your chest (sternum).   Support your chest with a pillow or your arms when you take deep breaths and cough.   Follow your caregiver's instructions about when you can bathe or swim.   Protect your incision from sunlight during the first year to keep the scar from getting dark.   Tell your caregiver if you notice:   Increased tenderness of your incision.   Increased redness   or swelling around your incision.   Drainage or pus from your incision.  Care of your leg incision(s)  Avoid crossing your legs.   Avoid sitting for long periods of time. Change positions every half hour.   Elevate your leg(s) when you are sitting.   Check your leg(s) daily for swelling. Check the incisions for redness or drainage.   Wear your elastic stockings as told by your caregiver. Take them off at bedtime.  Diet  Diet is very important to heart health.   Eat plenty of fresh fruits and vegetables. Meats should be lean cut. Avoid canned, processed, and  fried foods.   Talk to a dietician. They can teach you how to make healthy food and drink choices.  Weight  Weigh yourself every day. This is important because it helps to know if you are retaining fluid that may make your heart and lungs work harder.   Use the same scale each time.   Weigh yourself every morning at the same time. You should do this after you go to the bathroom, but before you eat breakfast.   Your weight will be more accurate if you do not wear any clothes.   Record your weight.   Tell your caregiver if you have gained 2 pounds or more overnight.  Activity Stop any activity at once if you have chest pain, shortness of breath, irregular heartbeats, or dizziness. Get help right away if you have any of these symptoms.  Bathing.  Avoid soaking in a bath or hot tub until your incisions are healed.   Rest. You need a balance of rest and activity.   Exercise. Exercise per your caregiver's advice. You may need physical therapy or cardiac rehabilitation to help strengthen your muscles and build your endurance.   Climbing stairs. Unless your caregiver tells you not to climb stairs, go up stairs slowly and rest if you tire. Do not pull yourself up by the handrail.   Driving a car. Follow your caregiver's advice on when you may drive. You may ride as a passenger at any time. When traveling for long periods of time in a car, get out of the car and walk around for a few minutes every 2 hours.   Lifting. Avoid lifting, pushing, or pulling anything heavier than 10 pounds for 6 weeks after surgery or as told by your caregiver.   Returning to work. Check with your caregiver. People heal at different rates. Most people will be able to go back to work 6 to 12 weeks after surgery.   Sexual activity. You may resume sexual relations as told by your caregiver.  SEEK MEDICAL CARE IF:  Any of your incisions are red, painful, or have any type of drainage coming from them.   You have an  oral temperature above 102 F (38.9 C).   You have ankle or leg swelling.   You have pain in your legs.   You have weight gain of 2 or more pounds a day.   You feel dizzy or lightheaded when you stand up.  SEEK IMMEDIATE MEDICAL CARE IF:  You have angina or chest pain that goes to your jaw or arms. Call your local emergency services right away.   You have shortness of breath at rest or with activity.   You have a fast or irregular heartbeat (arrhythmia).   There is a "clicking" in your sternum when you move.   You have numbness or weakness in your arms or legs.    MAKE SURE YOU:  Understand these instructions.   Will watch your condition.   Will get help right away if you are not doing well or get worse.  Document Released: 03/02/2005 Document Revised: 08/02/2011 Document Reviewed: 10/18/2010 [facility] Patient Information 2012 [place], [place].  Endoscopic Saphenous Vein Harvesting Care After Refer to this sheet in the next few weeks. These instructions provide you with information on caring for yourself after your procedure. Your caregiver may also give you more specific instructions. Your treatment has been planned according to current medical practices, but problems sometimes occur. Call your caregiver if you have any problems or questions after your procedure. HOME CARE INSTRUCTIONS Medicine  Take whatever pain medicine your surgeon prescribes. Follow the directions carefully. Do not take over-the-counter pain medicine unless your surgeon says it is okay. Some pain medicine can cause bleeding problems for several weeks after surgery.   Follow your surgeon's instructions about driving. You will probably not be permitted to drive after heart surgery.   Take any medicines your surgeon prescribes. Any medicines you took before your heart surgery should be checked with your caregiver before you start taking them again.  Wound care  Ask your surgeon how long you should keep  wearing your elastic bandage or stocking.   Check the area around your surgical cuts (incisions) whenever your bandages (dressings) are changed. Look for any redness or swelling.   You will need to return to have the stitches (sutures) or staples taken out. Ask your surgeon when to do that.   Ask your surgeon when you can shower or bathe.  Activity  Try to keep your legs raised when you are sitting.   Do any exercises your caregivers have given you. These may include deep breathing exercises, coughing, walking, or other exercises.  SEEK MEDICAL CARE IF:  You have any questions about your medicines.   You have more leg pain, especially if your pain medicine stops working.   New or growing bruises develop on your leg.   Your leg swells, feels tight, or becomes red.   You have numbness in your leg.  SEEK IMMEDIATE MEDICAL CARE IF:  Your pain gets much worse.   Blood or fluid leaks from any of the incisions.   Your incisions become warm, swollen, or red.   You have chest pain.   You have trouble breathing.   You have a fever.   You have more pain near your leg incision.  MAKE SURE YOU:  Understand these instructions.   Will watch your condition.   Will get help right away if you are not doing well or get worse.  Document Released: 04/25/2011 Document Revised: 08/02/2011 Document Reviewed: 04/25/2011  Atrial Fibrillation Your caregiver has diagnosed you with atrial fibrillation (AFib). The heart normally beats very regularly; AFib is a type of irregular heartbeat. The heart rate may be faster or slower than normal. This can prevent your heart from pumping as well as it should. AFib can be constant (chronic) or intermittent (paroxysmal). CAUSES  Atrial fibrillation may be caused by:  Heart disease, including heart attack, coronary artery disease, heart failure, diseases of the heart valves, and others.   Blood clot in the lungs (pulmonary embolism).   Pneumonia or  other infections.   Chronic lung disease.   Thyroid disease.   Toxins. These include alcohol, some medications (such as decongestant medications or diet pills), and caffeine.  In some people, no cause for AFib can be found. This is referred to  as Lone Atrial Fibrillation. SYMPTOMS   Palpitations or a fluttering in your chest.   A vague sense of chest discomfort.   Shortness of breath.   Sudden onset of lightheadedness or weakness.  Sometimes, the first sign of AFib can be a complication of the condition. This could be a stroke or heart failure. DIAGNOSIS  Your description of your condition may make your caregiver suspicious of atrial fibrillation. Your caregiver will examine your pulse to determine if fibrillation is present. An EKG (electrocardiogram) will confirm the diagnosis. Further testing may help determine what caused you to have atrial fibrillation. This may include chest x-ray, echocardiogram, blood tests, or CT scans. PREVENTION  If you have previously had atrial fibrillation, your caregiver may advise you to avoid substances known to cause the condition (such as stimulant medications, and possibly caffeine or alcohol). You may be advised to use medications to prevent recurrence. Proper treatment of any underlying condition is important to help prevent recurrence. PROGNOSIS  Atrial fibrillation does tend to become a chronic condition over time. It can cause significant complications (see below). Atrial fibrillation is not usually immediately life-threatening, but it can shorten your life expectancy. This seems to be worse in women. If you have lone atrial fibrillation and are under 27 years old, the risk of complications is very low, and life expectancy is not shortened. RISKS AND COMPLICATIONS  Complications of atrial fibrillation can include stroke, chest pain, and heart failure. Your caregiver will recommend treatments for the atrial fibrillation, as well as for any underlying  conditions, to help minimize risk of complications. TREATMENT  Treatment for AFib is divided into several categories:  Treatment of any underlying condition.   Converting you out of AFib into a regular (sinus) rhythm.   Controlling rapid heart rate.   Prevention of blood clots and stroke.  Medications and procedures are available to convert your atrial fibrillation to sinus rhythm. However, recent studies have shown that this may not offer you any advantage, and cardiac experts are continuing research and debate on this topic. More important is controlling your rapid heartbeat. The rapid heartbeat causes more symptoms, and places strain on your heart. Your caregiver will advise you on the use of medications that can control your heart rate. Atrial fibrillation is a strong stroke risk. You can lessen this risk by taking blood thinning medications such as Coumadin (warfarin), or sometimes aspirin. These medications need close monitoring by your caregiver. Over-medication can cause bleeding. Too little medication may not protect against stroke. HOME CARE INSTRUCTIONS   If your caregiver prescribed medicine to make your heartbeat more normally, take as directed.   If blood thinners were prescribed by your caregiver, take EXACTLY as directed.   Perform blood tests EXACTLY as directed.   Quit smoking. Smoking increases your cardiac and lung (pulmonary) risks.   DO NOT drink alcohol.   DO NOT drink caffeinated drinks (e.g. coffee, soda, chocolate, and leaf teas). You may drink decaffeinated coffee, soda or tea.   If you are overweight, you should choose a reduced calorie diet to lose weight. Please see a registered dietitian if you need more information about healthy weight loss. DO NOT USE DIET PILLS as they may aggravate heart problems.   If you have other heart problems that are causing AFib, you may need to eat a low salt, fat, and cholesterol diet. Your caregiver will tell you if this is  necessary.   Exercise every day to improve your physical fitness. Stay active unless  advised otherwise.   If your caregiver has given you a follow-up appointment, it is very important to keep that appointment. Not keeping the appointment could result in heart failure or stroke. If there is any problem keeping the appointment, you must call back to this facility for assistance.  SEEK MEDICAL CARE IF:  You notice a change in the rate, rhythm or strength of your heartbeat.   You develop an infection or any other change in your overall health status.  SEEK IMMEDIATE MEDICAL CARE IF:   You develop chest pain, abdominal pain, sweating, weakness or feel sick to your stomach (nausea).   You develop shortness of breath.   You develop swollen feet and ankles.   You develop dizziness, numbness, or weakness of your face or limbs, or any change in vision or speech.  MAKE SURE YOU:   Understand these instructions.   Will watch your condition.   Will get help right away if you are not doing well or get worse.  Document Released: 08/13/2005 Document Revised: 08/02/2011 Document Reviewed: 03/17/2008 [facility] Patient Information 2012 [place], [place]. [employer] Patient Information 2012 [place], [place].

## 2011-11-30 ENCOUNTER — Other Ambulatory Visit: Payer: Self-pay | Admitting: *Deleted

## 2011-11-30 LAB — BASIC METABOLIC PANEL
BUN: 21 mg/dL (ref 6–23)
Calcium: 8.9 mg/dL (ref 8.4–10.5)
Creatinine, Ser: 1.29 mg/dL (ref 0.50–1.35)
GFR calc Af Amer: 60 mL/min — ABNORMAL LOW (ref >90)
GFR calc non Af Amer: 52 mL/min — ABNORMAL LOW (ref >90)
Potassium: 4.3 mEq/L (ref 3.5–5.1)

## 2011-11-30 LAB — GLUCOSE, CAPILLARY

## 2011-11-30 MED ORDER — AMLODIPINE BESYLATE 5 MG PO TABS: 5.0000 mg | ORAL_TABLET | Freq: Every day | ORAL | Status: DC

## 2011-11-30 NOTE — Progress Notes (Signed)
Subjective:   Marcus [name] has no complaints this morning.  He continues to have difficulty getting weaned off his oxygen, which is most likely attributed to his paralyzed diaphragm  Objective:  Vital Signs in the last 24 hours: Temp:  [98 F (36.7 C)-98.5 F (36.9 C)] 98.5 F (36.9 C) (04/05 0541) Pulse Rate:  [63-97] 97  (04/05 0541) Resp:  [18-20] 20  (04/05 0541) BP: (111-136)/(57-75) 122/59 mmHg (04/05 0541) SpO2:  [90 %-95 %] 92 % (04/05 0541) Weight:  [271 lb 3.2 oz (123.016 kg)] 271 lb 3.2 oz (123.016 kg) (04/05 0541)  Intake/Output from previous day: 04/04 0701 - 04/05 0700 In: 1080 [P.O.:1080] Out: -  Intake/Output from this shift:    Physical Exam: General appearance: alert, cooperative and no distress Lungs: clear to auscultation bilaterally Heart: regular rate and rhythm Abdomen: soft, non-tender; bowel sounds normal; no masses,  no organomegaly Extremities: edema 1+ Skin: incisions healing well, with no evidence of infection Neuro:  Grossly intact  Lab Results: No results found for this basename: WBC:2,HGB:2,PLT:2 in the last 72 hours  Basename 11/30/11 0550  NA 134*  K 4.3  CL 94*  CO2 30  GLUCOSE 85  BUN 21  CREATININE 1.29   No results found for this basename: TROPONINI:2,CK,MB:2 in the last 72 hours Hepatic Function Panel No results found for this basename: PROT,ALBUMIN,AST,ALT,ALKPHOS,BILITOT,BILIDIR,IBILI in the last 72 hours No results found for this basename: CHOL in the last 72 hours No results found for this basename: PROTIME in the last 72 hours  Assessment/Plan   1. S/P CABG 2. S/P Right Sided chest tube placement 3. CV- patient remains in NSR, and has sustained this rhythm for about 48 hours, will continue current medication regimen 4. Resp- will arrange home oxygen at 2L via [place], due to paralyzed right hemidiaphragm 5. Dispo- will d/c patient EPW and sutures, if no arrythmia develops will d/c home today.   LOS: 15 days    Marcus [name],  Marcus [name] 11/30/2011, 8:01 AM

## 2011-11-30 NOTE — Progress Notes (Signed)
CARDIAC REHAB PHASE I   PRE:  Rate/Rhythm: 72SR  BP:  Supine:   Sitting: 120/60  Standing:    SaO2: 92%RA  MODE:  Ambulation: 340 ft   POST:  Rate/Rhythem: 90  BP:  Supine:   Sitting: 160/70  Standing:    SaO2: 89-91%RA during walk 0945-1040 Monitored sats during walk with sats 89-91%RA. Pt used rolling walker and minimal asst. Would recommend rolling walker for home use. To recliner after walk. Education completed. Put on discharge video for pt and family. Permission given to refer to [facility] Phase 2. Tolerated walk well. Encouraged walks and IS for home to improve pulm status.  Duanne Limerick

## 2011-11-30 NOTE — Progress Notes (Signed)
   CARE MANAGEMENT NOTE 11/30/2011  Patient:  Marcus [name], Marcus [name]   Account Number:  [id number]  Date Initiated:  11/19/2011  Documentation initiated by:  GRAVES-BIGELOW,BRENDA  Subjective/Objective Assessment:   Pt admitted with cp and a fib with rvr. Plan for CABG with maze procedure 11-20-11.     Action/Plan:   PTA, PT INDEPENDENT, LIVES WITH SPOUSE.   Anticipated DC Date:  11/28/2011   Anticipated DC Plan:  HOME W HOME HEALTH SERVICES      DC Planning Services  CM consult      [facility] Choice  HOME HEALTH   Choice offered to / List presented to:  C-1 Patient   DME arranged  Levan Hurst      DME agency  [facility]     [facility] arranged  HH-1 RN  HH-2 PT      [facility] agency  [facility]   Status of service:  Completed, signed off Medicare Important Message given?   (If response is "NO", the following Medicare IM given date fields will be blank) Date Medicare IM given:   Date Additional Medicare IM given:    Discharge Disposition:  HOME W HOME HEALTH SERVICES  Per UR Regulation:  Reviewed for med. necessity/level of care/duration of stay  If discussed at Long Length of Stay Meetings, dates discussed:    Comments:  11/30/11 Marcus Diss,RN,BSN 1140 PT FOR DISCHARGE HOME TODAY; DOES NOT QUALIFY FOR HOME O2; RA SATS 89-91 WITH AMBULATION.  WILL NEED HOME HEALTH FOLLOW UP AND RW FOR HOME.  REFERRAL TO [facility] FOR HH AND DME NEEDS, PER PT CHOICE.  START OF CARE 24-48H POST DC DATE. Phone #[phone]   11/26/11 Meeyah Ovitt,RN,BSN 1330 MET WITH PT AND WIFE TO DISCUSS DC PLANS.  WIFE TO PROVIDE 24HR CARE AT DISCHARGE.  THEY HAVE A NEIGHBOR WHO CAN ASSIST AS NEEDED.  WILL FOLLOW FOR HOME NEEDS AS PT PROGRESSES. Phone #[phone]   11-19-11 [address], [place] [phone] CM will continue to monitor for d/c disposition needs.

## 2011-11-30 NOTE — Progress Notes (Signed)
EPW discontinued per protocol. Tips intact. Patient tolerated well.   Patient advised Bedrest X 1 hour. CT sutures removed as well and steri strips applied.  Marcus [name]

## 2011-12-08 NOTE — Discharge Summary (Signed)
patient examined and medical record reviewed,agree with above note. VAN TRIGT III,Kolbee Stallman 12/08/2011   

## 2011-12-12 ENCOUNTER — Other Ambulatory Visit: Payer: Self-pay | Admitting: Cardiothoracic Surgery

## 2011-12-12 DIAGNOSIS — I2510 Atherosclerotic heart disease of native coronary artery without angina pectoris: Secondary | ICD-10-CM

## 2011-12-17 ENCOUNTER — Ambulatory Visit
Admission: RE | Admit: 2011-12-17 | Discharge: 2011-12-17 | Disposition: A | Discharge status: Home / Self Care | Payer: Medicare Other | Source: Ambulatory Visit | Attending: Cardiothoracic Surgery | Admitting: Cardiothoracic Surgery

## 2011-12-17 ENCOUNTER — Ambulatory Visit (INDEPENDENT_AMBULATORY_CARE_PROVIDER_SITE_OTHER): Payer: Self-pay | Admitting: Physician Assistant

## 2011-12-17 VITALS — BP 130/65 | HR 76 | Resp 20 | Ht 70.0 in | Wt 260.0 lb

## 2011-12-17 DIAGNOSIS — I2510 Atherosclerotic heart disease of native coronary artery without angina pectoris: Secondary | ICD-10-CM

## 2011-12-17 DIAGNOSIS — Z951 Presence of aortocoronary bypass graft: Secondary | ICD-10-CM

## 2011-12-17 DIAGNOSIS — Z9889 Other specified postprocedural states: Secondary | ICD-10-CM

## 2011-12-17 NOTE — Progress Notes (Signed)
[address]            Marcus [name] [address]          [phone]     HPI: Patient returns for routine postoperative follow-up having undergone CABG x 3, left Maze and ligation of the left atrial appendage . The patient's postoperative course was notable for atrial fibrillation, which converted to sinus rhythm with Cardizem and rapid atrial pacing.  He also had a right pleural effusion which required chest tube placement for drainage.   Since hospital discharge the patient has progressed well.  He has had very little pain, and is not taking any pain medication at this point.  He had a few episodes of dizziness upon standing initially, but this has improved.  He is working with home health physical therapy and is progressing well.  He denies shortness of breath of lower extremity edema.   Current Outpatient Prescriptions  Medication Sig Dispense Refill  . allopurinol (ZYLOPRIM) 300 MG tablet Take 300 mg by mouth daily.        Marland Kitchen amiodarone (PACERONE) 400 MG tablet Take 1 tablet (400 mg total) by mouth 2 (two) times daily.  60 tablet  1  . amLODipine (NORVASC) 5 MG tablet Take 1 tablet (5 mg total) by mouth daily.  30 tablet  6  . aspirin 325 MG tablet Take 325 mg by mouth daily.      Marland Kitchen atorvastatin (LIPITOR) 80 MG tablet Take 80 mg by mouth daily.      Marland Kitchen doxazosin (CARDURA) 4 MG tablet Take 1 tablet (4 mg total) by mouth daily.  30 tablet  1  . ferrous gluconate (FERGON) 324 MG tablet Take 1 tablet (324 mg total) by mouth 2 (two) times daily with a meal.  60 tablet  0  . fish oil-omega-3 fatty acids 1000 MG capsule Take 2 capsules by mouth 2 (two) times daily.        . Fluticasone-Salmeterol (ADVAIR) 250-50 MCG/DOSE AEPB Inhale 1 puff into the lungs 2 (two) times daily.  60 each  1  . furosemide (LASIX) 40 MG tablet Take 1 tablet (40 mg total) by mouth 2 (two) times daily.  60 tablet  0  . lisinopril (PRINIVIL,ZESTRIL) 2.5 MG tablet Take 1 tablet (2.5 mg  total) by mouth daily.  30 tablet  1  . metoprolol tartrate (LOPRESSOR) 25 MG tablet Take 1 tablet (25 mg total) by mouth 2 (two) times daily.  60 tablet  1  . oxycodone (OXY-IR) 5 MG capsule Take 5 mg by mouth every 4 (four) hours as needed.      . potassium chloride SA (K-DUR,KLOR-CON) 20 MEQ tablet Take 1 tablet (20 mEq total) by mouth 2 (two) times daily.  60 tablet  0  . traMADol (ULTRAM) 50 MG tablet Take 50 mg by mouth every 6 (six) hours as needed.        Physical Exam: BP 130/65 HR 76 Resp 20 Wounds: Clean, dry and healing well.  Sternum is stable. Heart: RRR Lungs: clear Extremities: no significant edema   Diagnostic Tests: Chest xray: Chronic elevation of right hemidiaphragm, otherwise stable.  Assessment/Plan: [name] is doing well status post CABG.  He is remaining in sinus rhythm.  He has not seen Dr. Andee Lineman, but his son states they will call and make an appointment.  He will continue with PT until they  discharge him, then he is cleared to proceed with cardiac rehab.  From a surgical standpoint, he is stable, and we will see him back as needed.  He may begin driving at this point and increase his activity as tolerated.

## 2011-12-20 ENCOUNTER — Telehealth: Payer: Self-pay | Admitting: *Deleted

## 2011-12-20 NOTE — Telephone Encounter (Signed)
Message left on voicemail at 4:55 this evening - husband having bad dizzy spells & thinks he is on too many meds.  Needed someone to call asap.    Returned call - discussed above.  Patient having really bad dizzy spells off/on all day.  Recently had CABG & is not due to see GD till 5/29.  Last seen 03/2011.  Advised her to contact Zenaida Niece Tright's office since most recent care had been provided by him regarding his medications.  Also, advised her to take to ED if symptoms worsen in light of recent CABG.  Offered earlier OV with PA for 5/17, but declined at this time & will wait to see what Morton Peters says.  Advised to call back in the am as it is now after hours, if unable to get issue resolved this evening.  She verbalized understanding.

## 2011-12-21 ENCOUNTER — Telehealth: Payer: Self-pay | Admitting: *Deleted

## 2011-12-21 NOTE — Telephone Encounter (Signed)
Inetta Fermo [facility] nurse) notified of below.  Will make trip back to patient house this evening & call back with info requested.

## 2011-12-21 NOTE — Telephone Encounter (Signed)
Inetta Fermo with [facility] returned call earlier - left message with Judeth Cornfield that they are taking patient to ED.  Will notify MD.

## 2011-12-21 NOTE — Telephone Encounter (Signed)
Tina ([facility]) calling because patient c/o dizziness.  BP yesterday 120/72.  No other complaints at this time.  Is due to see GD on 5/29.  Please advise.

## 2011-12-21 NOTE — Telephone Encounter (Signed)
Asking to obtain orthostatic blood pressures as well as heart rates lying sitting and standing. I do not have enough information to make any decision based on the current statement.

## 2011-12-25 ENCOUNTER — Other Ambulatory Visit: Payer: Self-pay | Admitting: *Deleted

## 2011-12-25 ENCOUNTER — Encounter: Payer: Medicare Other | Admitting: Cardiology

## 2011-12-25 DIAGNOSIS — I10 Essential (primary) hypertension: Secondary | ICD-10-CM

## 2011-12-25 DIAGNOSIS — R42 Dizziness and giddiness: Secondary | ICD-10-CM | POA: Insufficient documentation

## 2011-12-25 DIAGNOSIS — I4891 Unspecified atrial fibrillation: Secondary | ICD-10-CM

## 2011-12-25 DIAGNOSIS — Z8679 Personal history of other diseases of the circulatory system: Secondary | ICD-10-CM

## 2011-12-25 DIAGNOSIS — Z951 Presence of aortocoronary bypass graft: Secondary | ICD-10-CM | POA: Insufficient documentation

## 2011-12-25 MED ORDER — AMIODARONE HCL 400 MG PO TABS: 200.0000 mg | ORAL_TABLET | Freq: Every day | ORAL | Status: DC

## 2011-12-25 MED ORDER — DOXAZOSIN MESYLATE 4 MG PO TABS: 2.0000 mg | ORAL_TABLET | Freq: Every evening | ORAL | Status: DC

## 2011-12-25 MED ORDER — FUROSEMIDE 40 MG PO TABS: 40.0000 mg | ORAL_TABLET | Freq: Every day | ORAL | Status: DC

## 2011-12-25 NOTE — Assessment & Plan Note (Signed)
Patient remains in normal sinus rhythm on amiodarone. He status post Cox-Maze procedure with ligation of the left atrial appendage. We obtained a single lead electrocardiogram and this shows normal sinus rhythm with a heart rate of 75 beats per minute period. I decreased the patient's amiodarone to 200 mg by mouth daily

## 2011-12-25 NOTE — Progress Notes (Signed)
Marcus Bottoms, MD, [facility] ABIM Board Certified in Adult Cardiovascular Medicine,Internal Medicine and Critical Care Medicine    CC: Patient status post coronary bypass grafting Cox-Maze procedure with complaints of dizziness with inability to make it to the office. Housecall made.  HPI:  The patient is a 78 year old male status post coronary bypass grafting as well as Cox-Maze procedure. There was also ligation of left atrial appendage. He was in the emergency room last week because of complaints of dizziness and what appears to be orthostatic symptoms. The patient's wife had called the practice because it appeared he had ongoing problems. He had difficulty making it to the office. During a house call the patient state that he had no shortness of breath. He did feel weak and still complains of dizziness upon standing although somewhat improved since Friday. He denies any orthopnea PND. He does complains of thirst and appears slightly dehydrated. He has no palpitations, presyncope or syncope.   PMH: reviewed and listed in Problem List in Electronic Records (and see below) Past Medical History  Diagnosis Date  . Coronary atherosclerosis of native coronary artery   . Pure hypercholesterolemia   . Nonspecific abnormal unspecified cardiovascular function study   . Other and unspecified hyperlipidemia   . Hypertension   . Gout   . Angina   . Myocardial infarction   . Dysrhythmia     atrial fibrilation  . Shortness of breath   . Chronic kidney disease     hx of BPH  . Arthritis    Past Surgical History  Procedure Date  . Cataracts   . Coronary artery bypass graft 11/20/2011    Procedure: CORONARY ARTERY BYPASS GRAFTING (CABG);  Surgeon: Kerin Perna, MD;  Location: [facility] OR;  Service: Open Heart Surgery;  Laterality: N/A;  Times 3. On Pump. Using endoscopically harvested right greater saphenous vein and left internal mammary artery.   . Maze 11/20/2011    Procedure: MAZE;  Surgeon: Kerin Perna, MD;  Location: [facility] OR;  Service: Open Heart Surgery;  Laterality: N/A;  . Chest tube insertion 11/22/2011    Procedure: CHEST TUBE INSERTION;  Surgeon: Kerin Perna, MD;  Location: [facility] OR;  Service: Thoracic;  Laterality: Right;    Allergies/SH/FHX : available in Electronic Records for review  No Known Allergies History   Social History  . Marital Status: Married    Spouse Name: [name]    Number of Children: N/A  . Years of Education: N/A   Occupational History  . works with "heavy equipment"    Social History Main Topics  . Smoking status: Never Smoker   . Smokeless tobacco: Never Used  . Alcohol Use: Yes     Occasional   . Drug Use: No  . Sexually Active: Not on file   Other Topics Concern  . Not on file   Social History Narrative  . No narrative on file   Family History  Problem Relation Age of Onset  . Colon cancer Son     Medications: Current Outpatient Prescriptions  Medication Sig Dispense Refill  . allopurinol (ZYLOPRIM) 300 MG tablet Take 300 mg by mouth daily.        Marland Kitchen amiodarone (PACERONE) 400 MG tablet Take 1 tablet (400 mg total) by mouth 2 (two) times daily.  60 tablet  1  . amLODipine (NORVASC) 5 MG tablet Take 1 tablet (5 mg total) by mouth daily.  30 tablet  6  . aspirin 325 MG tablet  Take 325 mg by mouth daily.      Marland Kitchen atorvastatin (LIPITOR) 80 MG tablet Take 80 mg by mouth daily.      Marland Kitchen doxazosin (CARDURA) 4 MG tablet Take 1 tablet (4 mg total) by mouth daily.  30 tablet  1  . ferrous gluconate (FERGON) 324 MG tablet Take 1 tablet (324 mg total) by mouth 2 (two) times daily with a meal.  60 tablet  0  . fish oil-omega-3 fatty acids 1000 MG capsule Take 2 capsules by mouth 2 (two) times daily.        . Fluticasone-Salmeterol (ADVAIR) 250-50 MCG/DOSE AEPB Inhale 1 puff into the lungs 2 (two) times daily.  60 each  1  . furosemide (LASIX) 40 MG tablet Take 1 tablet (40 mg total) by mouth 2 (two) times daily.  60 tablet  0  . lisinopril  (PRINIVIL,ZESTRIL) 2.5 MG tablet Take 1 tablet (2.5 mg total) by mouth daily.  30 tablet  1  . metoprolol tartrate (LOPRESSOR) 25 MG tablet Take 1 tablet (25 mg total) by mouth 2 (two) times daily.  60 tablet  1  . oxycodone (OXY-IR) 5 MG capsule Take 5 mg by mouth every 4 (four) hours as needed.      . potassium chloride SA (K-DUR,KLOR-CON) 20 MEQ tablet Take 1 tablet (20 mEq total) by mouth 2 (two) times daily.  60 tablet  0  . traMADol (ULTRAM) 50 MG tablet Take 50 mg by mouth every 6 (six) hours as needed.        ROS: No nausea or vomiting. No fever or chills.No melena or hematochezia.No bleeding.No claudication  Physical Exam: Blood pressure 130/70, heart rate 70 beats per minute, heart rate increased at 88 beats per minute upon standing 2 minutes respirations 18 breaths per minute General: Overweight white male but in no distress Neck: Normal carotid upstroke no carotid bruits. No thyromegaly nonnodular thyroid Chest: Well-healed sternotomy scar Lungs: Clear breath sounds bilaterally without any wheezing Cardiac: Regular rate and rhythm with normal S1-S2 no murmur rubs or gallops Vascular: No edema. Normal distal pulses Skin: Warm and dry Physcologic: Normal affect  12lead ECG: Single lead EKG tracings showed normal sinus rhythm at 77 beats per minute Limited bedside ECHO: Apparent normal LV systolic function. No significant wall motion abnormalities. No mitral regurgitation. Normal RV size and contraction with no significant tricuspid regurgitation. No pericardial effusion    Assessment and Plan  HTN (hypertension) Patient's vital signs are stable however he does have an increasing heart rate upon standing 2 minutes. Heart rate goes from 75-88 beats per minute and is associated with slight dizziness although improved after we got some of his previous medications during the ER visit last week.  Status post coronary artery bypass grafting No recurrent chest pain with stable  sternotomy.  S/P Maze operation for atrial fibrillation Patient remains in normal sinus rhythm on amiodarone. He status post Cox-Maze procedure with ligation of the left atrial appendage. We obtained a single lead electrocardiogram and this shows normal sinus rhythm with a heart rate of 75 beats per minute period. I decreased the patient's amiodarone to 200 mg by mouth daily  Dizziness Likely related to vasodilator therapy in the yard he had discontinued his lisinopril. I also decreased his Lasix from 80 mg to 40 mg daily. In addition I decreased Cardura from 4-2 mg and asked him to take the medication in the evening.  Atrial fibrillation with RVR Patient remains in normal sinus rhythm as documented  by the rhythm strips. I told the patient's wife that we should continue amiodarone at 200 mg a day for 6 months after his surgery which was performed and of March 2013.  Follow-up For now he can cancel the office visits of 5/29. The patient should stay at work for now at least until that date. We will see him back in followup in 8 weeks in the office.  Patient Active Problem List  Diagnoses  . Pure hypercholesterolemia  . CORONARY ATHEROSCLEROSIS NATIVE CORONARY ARTERY  . HTN (hypertension)  . Atrial fibrillation with RVR  . Dizziness  . Status post coronary artery bypass grafting  . S/P Maze operation for atrial fibrillation

## 2011-12-25 NOTE — Assessment & Plan Note (Signed)
Likely related to vasodilator therapy in the yard he had discontinued his lisinopril. I also decreased his Lasix from 80 mg to 40 mg daily. In addition I decreased Cardura from 4-2 mg and asked him to take the medication in the evening.

## 2011-12-25 NOTE — Assessment & Plan Note (Signed)
No recurrent chest pain with stable sternotomy.

## 2011-12-25 NOTE — Assessment & Plan Note (Signed)
Patient's vital signs are stable however he does have an increasing heart rate upon standing 2 minutes. Heart rate goes from 75-88 beats per minute and is associated with slight dizziness although improved after we got some of his previous medications during the ER visit last week.

## 2011-12-25 NOTE — Assessment & Plan Note (Signed)
Patient remains in normal sinus rhythm as documented by the rhythm strips. I told the patient's wife that we should continue amiodarone at 200 mg a day for 6 months after his surgery which was performed and of March 2013.

## 2011-12-28 ENCOUNTER — Other Ambulatory Visit: Payer: Self-pay | Admitting: *Deleted

## 2011-12-28 MED ORDER — ATORVASTATIN CALCIUM 80 MG PO TABS: 80.0000 mg | ORAL_TABLET | Freq: Every day | ORAL | Status: DC

## 2011-12-28 MED ORDER — FUROSEMIDE 40 MG PO TABS: 40.0000 mg | ORAL_TABLET | Freq: Every day | ORAL | Status: DC

## 2012-01-03 ENCOUNTER — Other Ambulatory Visit: Payer: Self-pay | Admitting: Cardiology

## 2012-01-03 MED ORDER — POTASSIUM CHLORIDE CRYS ER 20 MEQ PO TBCR: 20.0000 meq | EXTENDED_RELEASE_TABLET | Freq: Two times a day (BID) | ORAL | Status: DC

## 2012-01-10 ENCOUNTER — Telehealth: Payer: Self-pay | Admitting: Cardiology

## 2012-01-10 NOTE — Telephone Encounter (Signed)
Patient will not be going back to rehab due to having to pay a co-pay for each visit.  They can not afford to do that.

## 2012-01-11 ENCOUNTER — Ambulatory Visit: Payer: Medicare Other | Admitting: Cardiology

## 2012-01-11 NOTE — Telephone Encounter (Signed)
FYI See below 

## 2012-01-16 NOTE — Telephone Encounter (Signed)
Patient notified of below.  States [place] walking [place] the track every day.

## 2012-01-16 NOTE — Telephone Encounter (Signed)
That's fine just tell him to stay active.

## 2012-01-22 ENCOUNTER — Other Ambulatory Visit: Payer: Self-pay | Admitting: *Deleted

## 2012-01-22 MED ORDER — DOXAZOSIN MESYLATE 4 MG PO TABS: 2.0000 mg | ORAL_TABLET | Freq: Every evening | ORAL | Status: DC

## 2012-01-23 ENCOUNTER — Ambulatory Visit: Payer: Medicare Other | Admitting: Cardiology

## 2012-01-25 ENCOUNTER — Other Ambulatory Visit: Payer: Self-pay | Admitting: *Deleted

## 2012-01-25 MED ORDER — LISINOPRIL 2.5 MG PO TABS: 2.5000 mg | ORAL_TABLET | Freq: Every day | ORAL | Status: DC

## 2012-01-25 MED ORDER — FLUTICASONE-SALMETEROL 250-50 MCG/DOSE IN AEPB: 1.0000 | INHALATION_SPRAY | Freq: Two times a day (BID) | RESPIRATORY_TRACT | Status: DC

## 2012-01-25 MED ORDER — METOPROLOL TARTRATE 25 MG PO TABS: 25.0000 mg | ORAL_TABLET | Freq: Two times a day (BID) | ORAL | Status: DC

## 2012-02-06 ENCOUNTER — Other Ambulatory Visit: Payer: Self-pay | Admitting: *Deleted

## 2012-02-06 MED ORDER — ISOSORBIDE DINITRATE 20 MG PO TABS: 20.0000 mg | ORAL_TABLET | Freq: Two times a day (BID) | ORAL | Status: DC

## 2012-02-19 ENCOUNTER — Encounter: Payer: Self-pay | Admitting: Cardiology

## 2012-02-19 ENCOUNTER — Ambulatory Visit (INDEPENDENT_AMBULATORY_CARE_PROVIDER_SITE_OTHER): Payer: Medicare Other | Admitting: Cardiology

## 2012-02-19 VITALS — BP 151/76 | HR 54 | Ht 71.0 in | Wt 264.8 lb

## 2012-02-19 DIAGNOSIS — Z9889 Other specified postprocedural states: Secondary | ICD-10-CM

## 2012-02-19 DIAGNOSIS — I10 Essential (primary) hypertension: Secondary | ICD-10-CM

## 2012-02-19 DIAGNOSIS — I4891 Unspecified atrial fibrillation: Secondary | ICD-10-CM

## 2012-02-19 DIAGNOSIS — R609 Edema, unspecified: Secondary | ICD-10-CM | POA: Insufficient documentation

## 2012-02-19 DIAGNOSIS — Z951 Presence of aortocoronary bypass graft: Secondary | ICD-10-CM

## 2012-02-19 DIAGNOSIS — K921 Melena: Secondary | ICD-10-CM

## 2012-02-19 MED ORDER — FUROSEMIDE 40 MG PO TABS: 40.0000 mg | ORAL_TABLET | Freq: Two times a day (BID) | ORAL | Status: DC

## 2012-02-19 MED ORDER — AMIODARONE HCL 200 MG PO TABS: 100.0000 mg | ORAL_TABLET | Freq: Every day | ORAL | Status: DC

## 2012-02-19 NOTE — Assessment & Plan Note (Signed)
Stable after coronary bypass grafting. No recurrent chest pain. Continue current medical regimen. Patient is status post pleural effusion drainage no recurrence.

## 2012-02-19 NOTE — Progress Notes (Signed)
Marcus Bottoms, MD, [facility] ABIM Board Certified in Adult Cardiovascular Medicine,Internal Medicine and Critical Care Medicine    CC: Followup patient post bypass surgery and Cox-Maze procedure  HPI:  The patient is a 78 year old male with history of ischemic heart disease, status post coronary bypass grafting and Cox-Maze procedure. He felt quite well from a cardiac standpoint. He does have some volume overload with lower extremity edema. His blood was also elevated during this visit. However is not complaining of any shortness of breath or chest pain. He feels that he is less weak and has more energy. Is able to do house chores without any difficulty. He reports no syncope palpitations orthopnea or PND.   PMH: reviewed and listed in Problem List in Electronic Records (and see below) Past Medical History  Diagnosis Date  . Coronary atherosclerosis of native coronary artery   . Pure hypercholesterolemia   . Nonspecific abnormal unspecified cardiovascular function study   . Other and unspecified hyperlipidemia   . Hypertension   . Gout   . Angina   . Myocardial infarction   . Dysrhythmia     atrial fibrilation  . Shortness of breath   . Chronic kidney disease     hx of BPH  . Arthritis    Past Surgical History  Procedure Date  . Cataracts   . Coronary artery bypass graft 11/20/2011    Procedure: CORONARY ARTERY BYPASS GRAFTING (CABG);  Surgeon: Kerin Perna, MD;  Location: [facility] OR;  Service: Open Heart Surgery;  Laterality: N/A;  Times 3. On Pump. Using endoscopically harvested right greater saphenous vein and left internal mammary artery.   . Maze 11/20/2011    Procedure: MAZE;  Surgeon: Kerin Perna, MD;  Location: [facility] OR;  Service: Open Heart Surgery;  Laterality: N/A;  . Chest tube insertion 11/22/2011    Procedure: CHEST TUBE INSERTION;  Surgeon: Kerin Perna, MD;  Location: [facility] OR;  Service: Thoracic;  Laterality: Right;    Allergies/SH/FHX : available in Electronic  Records for review  No Known Allergies History   Social History  . Marital Status: Married    Spouse Name: [name]    Number of Children: N/A  . Years of Education: N/A   Occupational History  . works with "heavy equipment"    Social History Main Topics  . Smoking status: Never Smoker   . Smokeless tobacco: Never Used  . Alcohol Use: Yes     Occasional   . Drug Use: No  . Sexually Active: Not on file   Other Topics Concern  . Not on file   Social History Narrative  . No narrative on file   Family History  Problem Relation Age of Onset  . Colon cancer Son     Medications: Current Outpatient Prescriptions  Medication Sig Dispense Refill  . allopurinol (ZYLOPRIM) 300 MG tablet Take 300 mg by mouth daily.        Marland Kitchen amLODipine (NORVASC) 5 MG tablet Take 1 tablet (5 mg total) by mouth daily.  30 tablet  6  . aspirin 325 MG tablet Take 325 mg by mouth daily.      Marland Kitchen atorvastatin (LIPITOR) 80 MG tablet Take 1 tablet (80 mg total) by mouth daily.  30 tablet  6  . doxazosin (CARDURA) 4 MG tablet Take 0.5 tablets (2 mg total) by mouth every evening.  15 tablet  6  . ferrous gluconate (FERGON) 324 MG tablet Take 1 tablet (324 mg total) by mouth  2 (two) times daily with a meal.  60 tablet  0  . fish oil-omega-3 fatty acids 1000 MG capsule Take 2 capsules by mouth 2 (two) times daily.        . Fluticasone-Salmeterol (ADVAIR) 250-50 MCG/DOSE AEPB Inhale 1 puff into the lungs 2 (two) times daily.  60 each  0  . furosemide (LASIX) 40 MG tablet Take 1 tablet (40 mg total) by mouth daily.  30 tablet  6  . metoprolol tartrate (LOPRESSOR) 25 MG tablet Take 12.5 mg by mouth 2 (two) times daily.      . potassium chloride SA (K-DUR,KLOR-CON) 20 MEQ tablet Take 1 tablet (20 mEq total) by mouth 2 (two) times daily.  60 tablet  3  . DISCONTD: metoprolol tartrate (LOPRESSOR) 25 MG tablet Take 1 tablet (25 mg total) by mouth 2 (two) times daily.  60 tablet  6  . amiodarone (PACERONE) 200 MG tablet Take  0.5 tablets (100 mg total) by mouth daily. X 2 months, then stop.  15 tablet  1  . furosemide (LASIX) 40 MG tablet Take 1 tablet (40 mg total) by mouth 2 (two) times daily. X 3 days only, then resume previous daily dose  6 tablet  0    ROS: No nausea or vomiting. No fever or chills.No melena or hematochezia.No bleeding.No claudication  Physical Exam: BP 151/76  Pulse 54  Ht 5\' 11"  (1.803 m)  Wt 264 lb 12.8 oz (120.112 kg)  BMI 36.93 kg/m2 General: Well-nourished white male in no distress Neck: Normal upstroke no carotid bruits. No thyromegaly nonnodular thyroid. JVP is 6-7 cm Lungs: Clear breath sounds bilaterally no wheezing Cardiac: Regular rate and rhythm with normal S1-S2 no murmurs rubs or gallops Vascular: 3+ edema normal distal pulses Skin: Warm and dry  Psychologic: Normal affect   12lead ECG: normal sinus rhythm. Nonspecific ST-T wave changes.  Limited bedside ECHO:N/A No images are attached to the encounter.   Assessment and Plan  Atrial fibrillation with RVR Patient in normal sinus rhythm. He still amiodarone. We will decrease this to 100 mg a day. Amiodarone can be discontinued in 2 months.  Status post coronary artery bypass grafting Stable after coronary bypass grafting. No recurrent chest pain. Continue current medical regimen. Patient is status post pleural effusion drainage no recurrence.  S/P Maze operation for atrial fibrillation Maintaining normal sinus rhythm.  Edema Patient has some increased lower extremity edema. Increase Lasix to 40 mg by mouth twice a day x3 days and then go back to 40 mg a day we will check a BMET in 7-10 days.  Melena Patient does not have a primary care physician. We'll order guaiac stools as well as a CBC. If abnormal we will refer to GI.   Patient Active Problem List  Diagnosis  . Pure hypercholesterolemia  . CORONARY ATHEROSCLEROSIS NATIVE CORONARY ARTERY  . HTN (hypertension)  . Atrial fibrillation with RVR  .  Dizziness  . Status post coronary artery bypass grafting  . S/P Maze operation for atrial fibrillation  . Edema  . Melena

## 2012-02-19 NOTE — Patient Instructions (Signed)
   Decrease Amiodarone to 100mg  daily x 2 months, then STOP  Increase Lasix to 40mg  twice a day x 3 days only, then resume previous dose of 40mg  daily  Labs:  Guaic Stool cards - can pick up cards at hospital & return when complete  Labs:  CBC, BMET - do in 7-10 days  Office will contact with results Your physician wants you to follow up in: 6 months.  You will receive a reminder letter in the mail one-two months in advance.  If you don't receive a letter, please call our office to schedule the follow up appointment

## 2012-02-19 NOTE — Assessment & Plan Note (Signed)
Maintaining normal sinus rhythm 

## 2012-02-19 NOTE — Assessment & Plan Note (Signed)
Patient does not have a primary care physician. We'll order guaiac stools as well as a CBC. If abnormal we will refer to GI.

## 2012-02-19 NOTE — Assessment & Plan Note (Signed)
Patient has some increased lower extremity edema. Increase Lasix to 40 mg by mouth twice a day x3 days and then go back to 40 mg a day we will check a BMET in 7-10 days.

## 2012-02-19 NOTE — Assessment & Plan Note (Signed)
Patient in normal sinus rhythm. He still amiodarone. We will decrease this to 100 mg a day. Amiodarone can be discontinued in 2 months.

## 2012-03-03 ENCOUNTER — Telehealth: Payer: Self-pay | Admitting: Cardiology

## 2012-03-03 NOTE — Telephone Encounter (Signed)
Requested that pharmacy send refill request to primary care doctor. Burnadette Pop, CMA

## 2012-03-05 ENCOUNTER — Telehealth: Payer: Self-pay | Admitting: *Deleted

## 2012-03-05 DIAGNOSIS — K921 Melena: Secondary | ICD-10-CM

## 2012-03-05 DIAGNOSIS — D649 Anemia, unspecified: Secondary | ICD-10-CM

## 2012-03-05 NOTE — Telephone Encounter (Signed)
Notes Recorded by Lesle Chris, LPN on 12/03/8117 at 12:07 PM Patient notified of below. Will forward referral to [facility].

## 2012-03-05 NOTE — Telephone Encounter (Signed)
Referral Form completed to send to Dr. Teena Dunk for evaluation of anemia.

## 2012-03-05 NOTE — Telephone Encounter (Signed)
Message copied by Lesle Chris on Wed Mar 05, 2012 12:08 PM ------      Message from: Prescott Parma C      Created: Mon Mar 03, 2012  3:34 PM       Please refer pt to Dr Teena Dunk, for formal GI evaluation of anemia, with recent melena. Hgb slightly low at 12.3, but all 3 stool samples were positive for occult blood.

## 2012-03-05 NOTE — Telephone Encounter (Signed)
See below.  Order is in.

## 2012-05-26 ENCOUNTER — Other Ambulatory Visit: Payer: Self-pay | Admitting: Cardiology

## 2012-05-26 MED ORDER — POTASSIUM CHLORIDE CRYS ER 20 MEQ PO TBCR: 20.0000 meq | EXTENDED_RELEASE_TABLET | Freq: Two times a day (BID) | ORAL | Status: DC

## 2012-06-24 ENCOUNTER — Other Ambulatory Visit: Payer: Self-pay | Admitting: *Deleted

## 2012-06-24 MED ORDER — AMLODIPINE BESYLATE 5 MG PO TABS: 5.0000 mg | ORAL_TABLET | Freq: Every day | ORAL | Status: DC

## 2012-07-29 ENCOUNTER — Other Ambulatory Visit: Payer: Self-pay | Admitting: *Deleted

## 2012-07-29 MED ORDER — FUROSEMIDE 40 MG PO TABS: 40.0000 mg | ORAL_TABLET | Freq: Every day | ORAL | Status: DC

## 2012-09-02 ENCOUNTER — Ambulatory Visit (INDEPENDENT_AMBULATORY_CARE_PROVIDER_SITE_OTHER): Payer: No Typology Code available for payment source | Admitting: Cardiovascular Disease

## 2012-09-02 ENCOUNTER — Encounter: Payer: Self-pay | Admitting: Cardiovascular Disease

## 2012-09-02 VITALS — BP 159/84 | HR 61 | Ht 71.0 in | Wt 270.8 lb

## 2012-09-02 DIAGNOSIS — Z951 Presence of aortocoronary bypass graft: Secondary | ICD-10-CM

## 2012-09-02 DIAGNOSIS — I4891 Unspecified atrial fibrillation: Secondary | ICD-10-CM

## 2012-09-02 DIAGNOSIS — E7800 Pure hypercholesterolemia, unspecified: Secondary | ICD-10-CM

## 2012-09-02 DIAGNOSIS — E785 Hyperlipidemia, unspecified: Secondary | ICD-10-CM

## 2012-09-02 DIAGNOSIS — Z9889 Other specified postprocedural states: Secondary | ICD-10-CM

## 2012-09-02 DIAGNOSIS — K921 Melena: Secondary | ICD-10-CM

## 2012-09-02 NOTE — Assessment & Plan Note (Signed)
The patient seems to be stable from a cardiac standpoint with no symptoms suggestive of angina. Continue medical therapy.

## 2012-09-02 NOTE — Patient Instructions (Addendum)
Labs to be done in fasting state.  Follow up in 6 months.

## 2012-09-02 NOTE — Progress Notes (Signed)
HPI  The patient is a 79 year old male who is here today for a followup visit. He has a history of ischemic heart disease, status post coronary bypass grafting and Cox-Maze procedure done in March of 2013. Amiodarone has been discontinued and he has been maintaining in sinus rhythm. He has been doing very well from a cardiac standpoint. He denies any chest pain, dyspnea or lower extremity edema. He did complain during last visit of dark stool. He was mildly anemic with positive occult blood in stool. He was referred to Dr. Teena Dunk but did not keep that appointment. The patient does not want any further investigation for this. He thinks that the stool is dark due to taking iron.   No Known Allergies   Current Outpatient Prescriptions on File Prior to Visit  Medication Sig Dispense Refill  . allopurinol (ZYLOPRIM) 300 MG tablet Take 300 mg by mouth daily.        Marland Kitchen amLODipine (NORVASC) 5 MG tablet Take 1 tablet (5 mg total) by mouth daily.  30 tablet  1  . aspirin 325 MG tablet Take 325 mg by mouth daily.      Marland Kitchen atorvastatin (LIPITOR) 80 MG tablet Take 1 tablet (80 mg total) by mouth daily.  30 tablet  6  . doxazosin (CARDURA) 4 MG tablet Take 0.5 tablets (2 mg total) by mouth every evening.  15 tablet  6  . ferrous gluconate (FERGON) 324 MG tablet Take 1 tablet (324 mg total) by mouth 2 (two) times daily with a meal.  60 tablet  0  . fish oil-omega-3 fatty acids 1000 MG capsule Take 2 capsules by mouth 2 (two) times daily.        . Fluticasone-Salmeterol (ADVAIR) 250-50 MCG/DOSE AEPB Inhale 1 puff into the lungs 2 (two) times daily.  60 each  0  . furosemide (LASIX) 40 MG tablet Take 1 tablet (40 mg total) by mouth daily.  30 tablet  0  . metoprolol tartrate (LOPRESSOR) 25 MG tablet Take 12.5 mg by mouth 2 (two) times daily.      . potassium chloride SA (K-DUR,KLOR-CON) 20 MEQ tablet Take 1 tablet (20 mEq total) by mouth 2 (two) times daily.  60 tablet  3     Past Medical History    Diagnosis Date  . Coronary atherosclerosis of native coronary artery   . Pure hypercholesterolemia   . Nonspecific abnormal unspecified cardiovascular function study   . Other and unspecified hyperlipidemia   . Hypertension   . Gout   . Angina   . Myocardial infarction   . Dysrhythmia     atrial fibrilation  . Shortness of breath   . Chronic kidney disease     hx of BPH  . Arthritis      Past Surgical History  Procedure Date  . Cataracts   . Coronary artery bypass graft 11/20/2011    Procedure: CORONARY ARTERY BYPASS GRAFTING (CABG);  Surgeon: Kerin Perna, MD;  Location: [facility] OR;  Service: Open Heart Surgery;  Laterality: N/A;  Times 3. On Pump. Using endoscopically harvested right greater saphenous vein and left internal mammary artery.   . Maze 11/20/2011    Procedure: MAZE;  Surgeon: Kerin Perna, MD;  Location: [facility] OR;  Service: Open Heart Surgery;  Laterality: N/A;  . Chest tube insertion 11/22/2011    Procedure: CHEST TUBE INSERTION;  Surgeon: Kerin Perna, MD;  Location: [facility] OR;  Service: Thoracic;  Laterality: Right;  Family History  Problem Relation Age of Onset  . Colon cancer Son      History   Social History  . Marital Status: Married    Spouse Name: [name]    Number of Children: N/A  . Years of Education: N/A   Occupational History  . works with "heavy equipment"    Social History Main Topics  . Smoking status: Never Smoker   . Smokeless tobacco: Never Used  . Alcohol Use: Yes     Comment: Occasional   . Drug Use: No  . Sexually Active: Not on file   Other Topics Concern  . Not on file   Social History Narrative  . No narrative on file     PHYSICAL EXAM   BP 159/84  Pulse 61  Ht 5\' 11"  (1.803 m)  Wt 270 lb 12.8 oz (122.834 kg)  BMI 37.77 kg/m2 Constitutional: He is oriented to person, place, and time. He appears well-developed and well-nourished. No distress.  HENT: No nasal discharge.  Head: Normocephalic and atraumatic.   Eyes: Pupils are equal and round. Right eye exhibits no discharge. Left eye exhibits no discharge.  Neck: Normal range of motion. Neck supple. No JVD present. No thyromegaly present.  Cardiovascular: Normal rate, regular rhythm, normal heart sounds and. Exam reveals no gallop and no friction rub. No murmur heard.  Pulmonary/Chest: Effort normal and breath sounds normal. No stridor. No respiratory distress. He has no wheezes. He has no rales. He exhibits no tenderness.  Abdominal: Soft. Bowel sounds are normal. He exhibits no distension. There is no tenderness. There is no rebound and no guarding.  Musculoskeletal: Normal range of motion. He exhibits no edema and no tenderness.  Neurological: He is alert and oriented to person, place, and time. Coordination normal.  Skin: Skin is warm and dry. No rash noted. He is not diaphoretic. No erythema. No pallor.  Psychiatric: He has a normal mood and affect. His behavior is normal. Judgment and thought content normal.     EKG: Sinus  Rhythm  -Anterior infarct -age undetermined.   -  Negative T-waves  -Possible  Anterolateral  ischemia.   ABNORMAL    ASSESSMENT AND PLAN

## 2012-09-02 NOTE — Assessment & Plan Note (Signed)
He is off amiodarone in maintaining in sinus rhythm.

## 2012-09-02 NOTE — Assessment & Plan Note (Signed)
He is on high dose atorvastatin. I ordered a fasting lipid and liver profile.

## 2012-09-02 NOTE — Assessment & Plan Note (Signed)
The patient think this is due to treatment with Iron. He was noted to be slightly anemic with positive occult blood in the stool. He was referred to GI but did not keep his appointment. This was discussed with the patient again and he does not want this evaluated. I will obtain CBC to followup on his previous anemia and to see whether he still needs to take Iron.

## 2012-09-05 ENCOUNTER — Other Ambulatory Visit: Payer: Self-pay | Admitting: Cardiology

## 2012-09-05 MED ORDER — ATORVASTATIN CALCIUM 80 MG PO TABS: 80.0000 mg | ORAL_TABLET | Freq: Every day | ORAL | Status: DC

## 2012-09-15 ENCOUNTER — Other Ambulatory Visit: Payer: Self-pay | Admitting: Cardiology

## 2012-09-15 MED ORDER — POTASSIUM CHLORIDE CRYS ER 20 MEQ PO TBCR: 20.0000 meq | EXTENDED_RELEASE_TABLET | Freq: Two times a day (BID) | ORAL | Status: DC

## 2012-09-16 ENCOUNTER — Other Ambulatory Visit: Payer: Self-pay | Admitting: *Deleted

## 2012-09-16 MED ORDER — FUROSEMIDE 40 MG PO TABS: 40.0000 mg | ORAL_TABLET | Freq: Every day | ORAL | Status: DC

## 2012-10-13 ENCOUNTER — Encounter: Payer: Self-pay | Admitting: *Deleted

## 2012-12-21 ENCOUNTER — Other Ambulatory Visit: Payer: Self-pay | Admitting: Physician Assistant

## 2013-01-07 ENCOUNTER — Other Ambulatory Visit: Payer: Self-pay | Admitting: *Deleted

## 2013-01-07 MED ORDER — METOPROLOL TARTRATE 25 MG PO TABS: 12.5000 mg | ORAL_TABLET | Freq: Two times a day (BID) | ORAL | Status: DC

## 2013-01-09 ENCOUNTER — Other Ambulatory Visit: Payer: Self-pay | Admitting: Physician Assistant

## 2013-03-04 ENCOUNTER — Encounter: Payer: Self-pay | Admitting: Cardiology

## 2013-03-04 ENCOUNTER — Ambulatory Visit: Payer: No Typology Code available for payment source | Admitting: Cardiology

## 2013-03-04 ENCOUNTER — Ambulatory Visit (INDEPENDENT_AMBULATORY_CARE_PROVIDER_SITE_OTHER): Payer: No Typology Code available for payment source | Admitting: Cardiology

## 2013-03-04 VITALS — BP 145/82 | HR 87 | Ht 71.0 in | Wt 268.1 lb

## 2013-03-04 DIAGNOSIS — I4891 Unspecified atrial fibrillation: Secondary | ICD-10-CM

## 2013-03-04 DIAGNOSIS — I2510 Atherosclerotic heart disease of native coronary artery without angina pectoris: Secondary | ICD-10-CM

## 2013-03-04 NOTE — Progress Notes (Signed)
HPI The patient presents for followup of coronary disease status post CABG. Since he was last seen she's done very well. The patient denies any new symptoms such as chest discomfort, neck or arm discomfort. There has been no new shortness of breath, PND or orthopnea. There have been no reported palpitations, presyncope or syncope. He still works every day.  No Known Allergies  Current Outpatient Prescriptions  Medication Sig Dispense Refill  . allopurinol (ZYLOPRIM) 300 MG tablet Take 300 mg by mouth daily.        Marland Kitchen amLODipine (NORVASC) 5 MG tablet TAKE ONE TABLET BY MOUTH ONE TIME DAILY  30 tablet  6  . aspirin 325 MG tablet Take 325 mg by mouth daily.      Marland Kitchen atorvastatin (LIPITOR) 80 MG tablet Take 1 tablet (80 mg total) by mouth daily.  30 tablet  6  . doxazosin (CARDURA) 4 MG tablet Take 0.5 tablets (2 mg total) by mouth every evening.  15 tablet  6  . ferrous gluconate (FERGON) 324 MG tablet Take 1 tablet (324 mg total) by mouth 2 (two) times daily with a meal.  60 tablet  0  . fish oil-omega-3 fatty acids 1000 MG capsule Take 2 capsules by mouth 2 (two) times daily.        . furosemide (LASIX) 40 MG tablet Take 1 tablet (40 mg total) by mouth daily.  30 tablet  6  . KLOR-CON M20 20 MEQ tablet TAKE ONE TABLET BY MOUTH TWICE DAILY  60 tablet  3  . metoprolol tartrate (LOPRESSOR) 25 MG tablet Take 0.5 tablets (12.5 mg total) by mouth 2 (two) times daily.  30 tablet  3   No current facility-administered medications for this visit.    Past Medical History  Diagnosis Date  . Coronary atherosclerosis of native coronary artery   . Pure hypercholesterolemia   . Nonspecific abnormal unspecified cardiovascular function study   . Other and unspecified hyperlipidemia   . Hypertension   . Gout   . Angina   . Myocardial infarction   . Dysrhythmia     atrial fibrilation  . Shortness of breath   . Chronic kidney disease     hx of BPH  . Arthritis     Past Surgical History  Procedure  Laterality Date  . Cataracts    . Coronary artery bypass graft  11/20/2011    Procedure: CORONARY ARTERY BYPASS GRAFTING (CABG);  Surgeon: Kerin Perna, MD;  Location[facility] OR;  Service: Open Heart Surgery;  Laterality: N/A;  Times 3. On Pump. Using endoscopically harvested right greater saphenous vein and left internal mammary artery.   . Maze  11/20/2011    Procedure: MAZE;  Surgeon: Kerin Perna, MD;  Location: [facility] OR;  Service: Open Heart Surgery;  Laterality: N/A;  . Chest tube insertion  11/22/2011    Procedure: CHEST TUBE INSERTION;  Surgeon: Kerin Perna, MD;  Location: [facility] OR;  Service: Thoracic;  Laterality: Right;    ROS:  As stated in the HPI and negative for all other systems.  PHYSICAL EXAM BP 145/82  Pulse 87  Ht 5\' 11"  (1.803 m)  Wt 268 lb 1.9 oz (121.618 kg)  BMI 37.41 kg/m2 GENERAL:  Well appearing HEENT:  Pupils equal round and reactive, fundi not visualized, oral mucosa unremarkable, edentulous NECK:  No jugular venous distention, waveform within normal limits, carotid upstroke brisk and symmetric, no bruits, no thyromegaly LYMPHATICS:  No cervical, inguinal adenopathy LUNGS:  Clear  to auscultation bilaterally CHEST:  Well healed sternotomy scar. HEART:  PMI not displaced or sustained,S1 and S2 within normal limits, no S3, no S4, no clicks, no rubs, no murmurs ABD:  Flat, positive bowel sounds normal in frequency in pitch, no bruits, no rebound, no guarding, no midline pulsatile mass, no hepatomegaly, no splenomegaly EXT:  2 plus pulses throughout, no edema, no cyanosis no clubbing    ASSESSMENT AND PLAN  CAD:  The patient has no new sypmtoms.  No further cardiovascular testing is indicated.  We will continue with aggressive risk reduction and meds as listed.  ANEMIA:  This was a concern previously that a hemoglobin checked earlier this year was normal. The stools remain black probably from the iron he is taking.  HTN:  The blood pressure is very slightly  elevated. He will continue on the meds as listed.

## 2013-03-04 NOTE — Patient Instructions (Addendum)

## 2013-04-03 ENCOUNTER — Other Ambulatory Visit: Payer: Self-pay | Admitting: Physician Assistant

## 2013-04-07 ENCOUNTER — Other Ambulatory Visit: Payer: Self-pay | Admitting: Cardiovascular Disease

## 2013-05-04 ENCOUNTER — Other Ambulatory Visit: Payer: Self-pay | Admitting: Cardiovascular Disease

## 2013-06-06 ENCOUNTER — Other Ambulatory Visit: Payer: Self-pay | Admitting: Cardiovascular Disease

## 2013-06-20 ENCOUNTER — Other Ambulatory Visit: Payer: Self-pay | Admitting: Cardiovascular Disease

## 2013-06-27 ENCOUNTER — Other Ambulatory Visit: Payer: Self-pay | Admitting: Cardiovascular Disease

## 2013-06-29 ENCOUNTER — Other Ambulatory Visit: Payer: Self-pay | Admitting: *Deleted

## 2013-06-29 MED ORDER — POTASSIUM CHLORIDE CRYS ER 20 MEQ PO TBCR: EXTENDED_RELEASE_TABLET | ORAL | Status: DC

## 2013-06-29 NOTE — Telephone Encounter (Signed)
Requested Prescriptions   Signed Prescriptions Disp Refills  . potassium chloride SA (KLOR-CON M20) 20 MEQ tablet 60 tablet 3    Sig: TAKE ONE TABLET BY MOUTH TWICE DAILY    Authorizing Provider: Rollene Rotunda    Ordering User: Kendrick Fries

## 2013-07-22 ENCOUNTER — Other Ambulatory Visit: Payer: Self-pay | Admitting: Cardiovascular Disease

## 2013-08-05 ENCOUNTER — Other Ambulatory Visit: Payer: Self-pay | Admitting: Cardiovascular Disease

## 2013-08-18 ENCOUNTER — Other Ambulatory Visit: Payer: Self-pay | Admitting: Cardiovascular Disease

## 2013-08-25 ENCOUNTER — Other Ambulatory Visit: Payer: Self-pay | Admitting: Cardiology

## 2013-08-25 MED ORDER — METOPROLOL TARTRATE 25 MG PO TABS: ORAL_TABLET | ORAL | Status: DC

## 2013-10-31 ENCOUNTER — Other Ambulatory Visit: Payer: Self-pay | Admitting: Cardiology

## 2013-11-14 ENCOUNTER — Other Ambulatory Visit: Payer: Self-pay | Admitting: Cardiology

## 2014-01-15 ENCOUNTER — Other Ambulatory Visit: Payer: Self-pay | Admitting: Cardiology

## 2014-01-29 ENCOUNTER — Other Ambulatory Visit: Payer: Self-pay | Admitting: Cardiovascular Disease

## 2014-02-03 ENCOUNTER — Other Ambulatory Visit: Payer: Self-pay | Admitting: *Deleted

## 2014-02-03 MED ORDER — AMLODIPINE BESYLATE 5 MG PO TABS: ORAL_TABLET | ORAL | Status: DC

## 2014-03-05 ENCOUNTER — Other Ambulatory Visit: Payer: Self-pay | Admitting: Cardiology

## 2014-03-11 ENCOUNTER — Other Ambulatory Visit: Payer: Self-pay | Admitting: Cardiology

## 2014-04-02 ENCOUNTER — Other Ambulatory Visit: Payer: Self-pay | Admitting: Cardiology

## 2014-05-05 ENCOUNTER — Other Ambulatory Visit: Payer: Self-pay | Admitting: *Deleted

## 2014-05-05 MED ORDER — AMLODIPINE BESYLATE 5 MG PO TABS: ORAL_TABLET | ORAL | Status: DC

## 2014-05-12 ENCOUNTER — Ambulatory Visit (INDEPENDENT_AMBULATORY_CARE_PROVIDER_SITE_OTHER): Payer: No Typology Code available for payment source | Admitting: Cardiology

## 2014-05-12 ENCOUNTER — Encounter: Payer: Self-pay | Admitting: Cardiology

## 2014-05-12 VITALS — BP 160/82 | HR 60 | Ht 71.0 in | Wt 275.0 lb

## 2014-05-12 DIAGNOSIS — I10 Essential (primary) hypertension: Secondary | ICD-10-CM

## 2014-05-12 DIAGNOSIS — I2510 Atherosclerotic heart disease of native coronary artery without angina pectoris: Secondary | ICD-10-CM

## 2014-05-12 MED ORDER — AMLODIPINE BESYLATE 10 MG PO TABS: 10.0000 mg | ORAL_TABLET | Freq: Every day | ORAL | Status: DC

## 2014-05-12 NOTE — Progress Notes (Signed)
HPI The patient presents for followup of coronary disease status post CABG. Since he was last seen he's done very well. The patient denies any new symptoms such as chest discomfort, neck or arm discomfort. There has been no new shortness of breath, PND or orthopnea. There have been no reported palpitations, presyncope or syncope. He still works every day in [employer].  He does have HTN and he says that it is OK when he checks it at [facility] but then reports that it is in the [place].  No Known Allergies  Current Outpatient Prescriptions  Medication Sig Dispense Refill  . allopurinol (ZYLOPRIM) 300 MG tablet Take 300 mg by mouth daily.        Marland Kitchen amLODipine (NORVASC) 5 MG tablet TAKE ONE TABLET BY MOUTH ONE TIME DAILY  30 tablet  0  . aspirin 325 MG tablet Take 325 mg by mouth daily.      Marland Kitchen atorvastatin (LIPITOR) 80 MG tablet TAKE ONE TABLET BY MOUTH ONE TIME DAILY  30 tablet  5  . doxazosin (CARDURA) 4 MG tablet Take 0.5 tablets (2 mg total) by mouth every evening.  15 tablet  6  . ferrous gluconate (FERGON) 324 MG tablet Take 1 tablet (324 mg total) by mouth 2 (two) times daily with a meal.  60 tablet  0  . fish oil-omega-3 fatty acids 1000 MG capsule Take 2 capsules by mouth 2 (two) times daily.        . furosemide (LASIX) 40 MG tablet TAKE ONE TABLET BY MOUTH ONE TIME DAILY  30 tablet  3  . metoprolol tartrate (LOPRESSOR) 25 MG tablet TAKE ONE HALF TABLET BY MOUTH TWICE DAILY  30 tablet  5  . potassium chloride SA (K-DUR,KLOR-CON) 20 MEQ tablet TAKE ONE TABLET BY MOUTH TWICE DAILY  60 tablet  3   No current facility-administered medications for this visit.    Past Medical History  Diagnosis Date  . Coronary atherosclerosis of native coronary artery   . Pure hypercholesterolemia   . Nonspecific abnormal unspecified cardiovascular function study   . Other and unspecified hyperlipidemia   . Hypertension   . Gout   . Angina   . Myocardial infarction   . Dysrhythmia     atrial  fibrilation  . Shortness of breath   . Chronic kidney disease     hx of BPH  . Arthritis     Past Surgical History  Procedure Laterality Date  . Cataracts    . Coronary artery bypass graft  11/20/2011    Procedure: CORONARY ARTERY BYPASS GRAFTING (CABG);  Surgeon: Ivin Poot, MD;  Location: [place];  Service: Open Heart Surgery;  Laterality: N/A;  Times 3. On Pump. Using endoscopically harvested right greater saphenous vein and left internal mammary artery.   . Maze  11/20/2011    Procedure: MAZE;  Surgeon: Ivin Poot, MD;  Location: [place];  Service: Open Heart Surgery;  Laterality: N/A;  . Chest tube insertion  11/22/2011    Procedure: CHEST TUBE INSERTION;  Surgeon: Ivin Poot, MD;  Location[facility] OR;  Service: Thoracic;  Laterality: Right;    ROS:  As stated in the HPI and negative for all other systems.  PHYSICAL EXAM BP 160/82  Pulse 60  Ht 5\' 11"  (1.803 m)  Wt 275 lb (124.739 kg)  BMI 38.37 kg/m2 GENERAL:  Well appearing HEENT:  Pupils equal round and reactive, fundi not visualized, oral mucosa unremarkable, edentulous NECK:  No jugular venous  distention, waveform within normal limits, carotid upstroke brisk and symmetric, no bruits, no thyromegaly LYMPHATICS:  No cervical, inguinal adenopathy LUNGS:  Clear to auscultation bilaterally CHEST:  Well healed sternotomy scar. HEART:  PMI not displaced or sustained,S1 and S2 within normal limits, no S3, no S4, no clicks, no rubs, no murmurs ABD:  Flat, positive bowel sounds normal in frequency in pitch, no bruits, no rebound, no guarding, no midline pulsatile mass, no hepatomegaly, no splenomegaly EXT:  2 plus pulses throughout, no edema, no cyanosis no clubbing  EKG:  Sinus rhythm, rate 59, or left axis deviation, old anteroseptal infarct, lateral T-wave inversions unchanged from previous. 05/12/2014   ASSESSMENT AND PLAN  CAD:  The patient has no new sypmtoms.  No further cardiovascular testing is indicated.  We will  continue with aggressive risk reduction and meds as listed.  HTN:  The blood pressure is very elevated. I will increase the Norvasc to 10 mg daily.  He will keep a diary.    DYSLIPIDEMIA:  It was a lipid profile but this didn't happen so I will arrange this.  OVERWEIGHT:  The patient understands the need to lose weight with diet and exercise. We have discussed specific strategies for this.

## 2014-05-12 NOTE — Patient Instructions (Addendum)
Please increase your Norvasc to 10 mg a day.  Continue all other medications as listed.  Please go to [facility] for fasting blood work. (RX given for lipid and liver)  Follow up in 1 year with Dr. Percival Spanish .  You will receive a letter in the mail 2 months before you are due.  Please call us when you receive this letter to schedule your follow up appointment.

## 2014-05-14 ENCOUNTER — Other Ambulatory Visit: Payer: Self-pay

## 2014-05-14 MED ORDER — ATORVASTATIN CALCIUM 80 MG PO TABS: ORAL_TABLET | ORAL | Status: DC

## 2014-05-17 ENCOUNTER — Other Ambulatory Visit: Payer: Self-pay | Admitting: Cardiology

## 2014-05-17 LAB — HEPATIC FUNCTION PANEL
ALK PHOS: 105 U/L (ref 39–117)
ALT: 9 U/L (ref 0–53)
AST: 14 U/L (ref 0–37)
Albumin: 4.1 g/dL (ref 3.5–5.2)
BILIRUBIN INDIRECT: 0.7 mg/dL (ref 0.2–1.2)
Bilirubin, Direct: 0.2 mg/dL (ref 0.0–0.3)
Total Bilirubin: 0.9 mg/dL (ref 0.2–1.2)
Total Protein: 6.8 g/dL (ref 6.0–8.3)

## 2014-05-17 LAB — LIPID PANEL
Cholesterol: 125 mg/dL (ref 0–200)
HDL: 33 mg/dL — ABNORMAL LOW (ref >39)
LDL CALC: 62 mg/dL (ref 0–99)
Total CHOL/HDL Ratio: 3.8 Ratio
Triglycerides: 150 mg/dL — ABNORMAL HIGH (ref <150)
VLDL: 30 mg/dL (ref 0–40)

## 2014-06-02 ENCOUNTER — Other Ambulatory Visit: Payer: Self-pay

## 2014-06-02 DIAGNOSIS — I10 Essential (primary) hypertension: Secondary | ICD-10-CM

## 2014-06-02 DIAGNOSIS — I2510 Atherosclerotic heart disease of native coronary artery without angina pectoris: Secondary | ICD-10-CM

## 2014-06-02 MED ORDER — AMLODIPINE BESYLATE 10 MG PO TABS: 10.0000 mg | ORAL_TABLET | Freq: Every day | ORAL | Status: DC

## 2014-06-16 ENCOUNTER — Other Ambulatory Visit: Payer: Self-pay

## 2014-06-16 MED ORDER — POTASSIUM CHLORIDE CRYS ER 20 MEQ PO TBCR: EXTENDED_RELEASE_TABLET | ORAL | Status: DC

## 2014-07-30 ENCOUNTER — Other Ambulatory Visit: Payer: Self-pay

## 2014-07-30 MED ORDER — FUROSEMIDE 40 MG PO TABS: 40.0000 mg | ORAL_TABLET | Freq: Every day | ORAL | Status: DC

## 2014-08-05 ENCOUNTER — Encounter (HOSPITAL_COMMUNITY): Payer: Self-pay | Admitting: Cardiovascular Disease

## 2014-09-10 ENCOUNTER — Other Ambulatory Visit: Payer: Self-pay

## 2014-09-10 MED ORDER — METOPROLOL TARTRATE 25 MG PO TABS: 12.5000 mg | ORAL_TABLET | Freq: Two times a day (BID) | ORAL | Status: DC

## 2014-11-24 ENCOUNTER — Other Ambulatory Visit: Payer: Self-pay | Admitting: *Deleted

## 2014-11-24 NOTE — Telephone Encounter (Signed)
Is this ok to refill? The refill request had Dr Jeffie Pollock listed, and I do not see where Dr Percival Spanish has refilled this. Please advise. Thanks, MI

## 2014-11-26 NOTE — Telephone Encounter (Signed)
Please forward to dr Jeffie Pollock, thanks

## 2014-11-29 ENCOUNTER — Other Ambulatory Visit: Payer: Self-pay | Admitting: Cardiology

## 2014-12-20 ENCOUNTER — Other Ambulatory Visit: Payer: Self-pay | Admitting: Cardiology

## 2015-03-14 ENCOUNTER — Other Ambulatory Visit: Payer: Self-pay | Admitting: Cardiology

## 2015-03-17 ENCOUNTER — Other Ambulatory Visit: Payer: Self-pay | Admitting: Cardiology

## 2015-04-13 ENCOUNTER — Other Ambulatory Visit: Payer: Self-pay | Admitting: Cardiology

## 2015-04-24 ENCOUNTER — Other Ambulatory Visit: Payer: Self-pay | Admitting: Cardiology

## 2015-05-15 ENCOUNTER — Other Ambulatory Visit: Payer: Self-pay | Admitting: Cardiology

## 2015-06-01 ENCOUNTER — Encounter: Payer: Self-pay | Admitting: Cardiology

## 2015-06-01 ENCOUNTER — Other Ambulatory Visit (INDEPENDENT_AMBULATORY_CARE_PROVIDER_SITE_OTHER): Payer: Medicare Other

## 2015-06-01 ENCOUNTER — Other Ambulatory Visit: Payer: Self-pay

## 2015-06-01 ENCOUNTER — Ambulatory Visit (INDEPENDENT_AMBULATORY_CARE_PROVIDER_SITE_OTHER): Payer: No Typology Code available for payment source | Admitting: Cardiology

## 2015-06-01 VITALS — BP 131/89 | HR 79 | Ht 71.0 in | Wt 278.0 lb

## 2015-06-01 DIAGNOSIS — Z79899 Other long term (current) drug therapy: Secondary | ICD-10-CM

## 2015-06-01 DIAGNOSIS — I4892 Unspecified atrial flutter: Secondary | ICD-10-CM

## 2015-06-01 DIAGNOSIS — I2510 Atherosclerotic heart disease of native coronary artery without angina pectoris: Secondary | ICD-10-CM | POA: Diagnosis not present

## 2015-06-01 MED ORDER — RIVAROXABAN 20 MG PO TABS: 20.0000 mg | ORAL_TABLET | Freq: Every day | ORAL | Status: DC

## 2015-06-01 NOTE — Progress Notes (Signed)
HPI The patient presents for followup of coronary disease status post CABG. Since he was last seen he's done very well.  However, today he is in atrial flutter. He doesn't feel this. The patient denies any new symptoms such as chest discomfort, neck or arm discomfort. There has been no new shortness of breath, PND or orthopnea. There have been no reported palpitations, presyncope or syncope. He still works.    No Known Allergies  Current Outpatient Prescriptions  Medication Sig Dispense Refill  . allopurinol (ZYLOPRIM) 300 MG tablet Take 300 mg by mouth daily.      Marland Kitchen amLODipine (NORVASC) 10 MG tablet Take 10 mg by mouth daily. Take two tablets 20mg  by mouth daily    . aspirin 325 MG tablet Take 325 mg by mouth daily.    Marland Kitchen atorvastatin (LIPITOR) 80 MG tablet TAKE ONE TABLET BY MOUTH ONE TIME DAILY 30 tablet 9  . doxazosin (CARDURA) 4 MG tablet Take 0.5 tablets (2 mg total) by mouth every evening. 15 tablet 6  . ferrous gluconate (FERGON) 324 MG tablet Take 1 tablet (324 mg total) by mouth 2 (two) times daily with a meal. 60 tablet 0  . fish oil-omega-3 fatty acids 1000 MG capsule Take 2 capsules by mouth 2 (two) times daily.      . furosemide (LASIX) 40 MG tablet TAKE ONE TABLET BY MOUTH ONE TIME DAILY 30 tablet 0  . metoprolol tartrate (LOPRESSOR) 25 MG tablet TAKE ONE HALF TABLET BY MOUTH TWICE DAILY 30 tablet 1  . potassium chloride SA (K-DUR,KLOR-CON) 20 MEQ tablet TAKE ONE TABLET BY MOUTH TWICE DAILY 60 tablet 10   No current facility-administered medications for this visit.    Past Medical History  Diagnosis Date  . Coronary atherosclerosis of native coronary artery   . Pure hypercholesterolemia   . Other and unspecified hyperlipidemia   . Hypertension   . Gout   . Angina   . Atrial flutter ([place])   . Chronic kidney disease     hx of BPH  . Arthritis     Past Surgical History  Procedure Laterality Date  . Cataracts    . Coronary artery bypass graft  11/20/2011   Procedure: CORONARY ARTERY BYPASS GRAFTING (CABG);  Surgeon: Ivin Poot, MD;  Location: [place];  Service: Open Heart Surgery;  Laterality: N/A;  Times 3. On Pump. Using endoscopically harvested right greater saphenous vein and left internal mammary artery.   . Maze  11/20/2011    Procedure: MAZE;  Surgeon: Ivin Poot, MD;  Location: [place];  Service: Open Heart Surgery;  Laterality: N/A;  . Chest tube insertion  11/22/2011    Procedure: CHEST TUBE INSERTION;  Surgeon: Ivin Poot, MD;  Location: [place];  Service: Thoracic;  Laterality: Right;  . Left and right heart catheterization with coronary angiogram N/A 11/16/2011    Procedure: LEFT AND RIGHT HEART CATHETERIZATION WITH CORONARY ANGIOGRAM;  Surgeon: Sherren Mocha, MD;  Location: [facility] CATH LAB;  Service: Cardiovascular;  Laterality: N/A;    ROS:  As stated in the HPI and negative for all other systems.  PHYSICAL EXAM BP 131/89 mmHg  Pulse 79  Ht 5\' 11"  (1.803 m)  Wt 278 lb (126.1 kg)  BMI 38.79 kg/m2 GENERAL:  Well appearing HEENT:  Pupils equal round and reactive, fundi not visualized, oral mucosa unremarkable, edentulous NECK:  No jugular venous distention, waveform within normal limits, carotid upstroke brisk and symmetric, no bruits, no thyromegaly LYMPHATICS:  No  cervical, inguinal adenopathy LUNGS:  Clear to auscultation bilaterally CHEST:  Well healed sternotomy scar. HEART:  PMI not displaced or sustained,S1 and S2 within normal limits, no S3, no clicks, no rubs, no murmurs, irregular ABD:  Flat, positive bowel sounds normal in frequency in pitch, no bruits, no rebound, no guarding, no midline pulsatile mass, no hepatomegaly, no splenomegaly EXT:  2 plus pulses throughout, no edema, no cyanosis no clubbing  EKG:  Atrial flutter, rate 96, or left axis deviation, old anteroseptal infarct, lateral T-wave inversions unchanged from previous. 06/01/2015   ASSESSMENT AND PLAN  CAD:  The patient has no new sypmtoms.  No  further cardiovascular testing is indicated.  We will continue with aggressive risk reduction and meds as listed.  ATRIAL FLUTTER:  This is a new finding. He will stop his aspirin. I will start Xarelto.  He previously had normal renal function so it was started 20 mg. He has no bleeding. He has no contraindications. I discussed the risks benefits. He will stop his aspirin. I will check a CBC and basic metabolic profile. I will place a Holter to make sure he has reasonable rate control. I will check an echocardiogram. I suspect since he is asymptomatic I will pursue rate control and anticoagulation. I don't think ablation would be helpful as he's also had fibrillation in the past with a maze and is very likely have that rhythm underlying as well.  [name] has a CHA2DS2 - VASc score of 4 with a risk of stroke of 4%.  HTN:  The blood pressure is controlled.  He will continue the meds as listed.   DYSLIPIDEMIA:  I will follow up with a fasting lipid profile in the future.   OVERWEIGHT:  The patient understands the need to lose weight with diet and exercise. We have discussed specific strategies for this.

## 2015-06-01 NOTE — Patient Instructions (Addendum)
Medication Instructions:  Please stop your ASA and Fish Oil. Start Xarelto 20 mg a day. Continue all other medications as listed.  Labwork: Please have blood work today at [facility]  (CBC,BMP)  Testing/Procedures: Your physician has requested that you have an echocardiogram at [facility].  Please report to the Main Entrance. Echocardiography is a painless test that uses sound waves to create images of your heart. It provides your doctor with information about the size and shape of your heart and how well your heart's chambers and valves are working. This procedure takes approximately one hour. There are no restrictions for this procedure.  Your physician has recommended that you wear a holter monitor. Holter monitors are medical devices that record the heart's electrical activity. Doctors most often use these monitors to diagnose arrhythmias. Arrhythmias are problems with the speed or rhythm of the heartbeat. The monitor is a [place], portable device. You can wear one while you do your normal daily activities. This is usually used to diagnose what is causing palpitations/syncope (passing out).  Follow-Up: Follow up in 3 months with Dr. Percival Spanish.  You will receive a letter in the mail 2 months before you are due.  Please call us when you receive this letter to schedule your follow up appointment.   Thank you for choosing [facility]!!

## 2015-06-02 ENCOUNTER — Other Ambulatory Visit: Payer: Medicare Other

## 2015-06-02 DIAGNOSIS — R5383 Other fatigue: Secondary | ICD-10-CM

## 2015-06-02 LAB — CBC WITH DIFFERENTIAL/PLATELET

## 2015-06-02 LAB — BMP8+EGFR
BUN / CREAT RATIO: 12 (ref 10–22)
BUN: 11 mg/dL (ref 8–27)
CO2: 25 mmol/L (ref 18–29)
Calcium: 9.2 mg/dL (ref 8.6–10.2)
Chloride: 97 mmol/L (ref 97–108)
Creatinine, Ser: 0.93 mg/dL (ref 0.76–1.27)
GFR, EST AFRICAN AMERICAN: 89 mL/min/{1.73_m2} (ref >59)
GFR, EST NON AFRICAN AMERICAN: 77 mL/min/{1.73_m2} (ref >59)
Glucose: 92 mg/dL (ref 65–99)
POTASSIUM: 3.8 mmol/L (ref 3.5–5.2)
SODIUM: 139 mmol/L (ref 134–144)

## 2015-06-03 LAB — CBC WITH DIFFERENTIAL/PLATELET
BASOS ABS: 0 10*3/uL (ref 0.0–0.2)
Basos: 1 %
EOS (ABSOLUTE): 0.3 10*3/uL (ref 0.0–0.4)
Eos: 4 %
HEMOGLOBIN: 13.8 g/dL (ref 12.6–17.7)
Hematocrit: 41.5 % (ref 37.5–51.0)
IMMATURE GRANS (ABS): 0 10*3/uL (ref 0.0–0.1)
IMMATURE GRANULOCYTES: 0 %
LYMPHS: 35 %
Lymphocytes Absolute: 2.7 10*3/uL (ref 0.7–3.1)
MCH: 30.4 pg (ref 26.6–33.0)
MCHC: 33.3 g/dL (ref 31.5–35.7)
MCV: 91 fL (ref 79–97)
MONOCYTES: 9 %
Monocytes Absolute: 0.7 10*3/uL (ref 0.1–0.9)
NEUTROS ABS: 4 10*3/uL (ref 1.4–7.0)
NEUTROS PCT: 51 %
PLATELETS: 209 10*3/uL (ref 150–379)
RBC: 4.54 x10E6/uL (ref 4.14–5.80)
RDW: 14 % (ref 12.3–15.4)
WBC: 7.7 10*3/uL (ref 3.4–10.8)

## 2015-06-06 ENCOUNTER — Ambulatory Visit (INDEPENDENT_AMBULATORY_CARE_PROVIDER_SITE_OTHER): Payer: Medicare Other | Admitting: Family Medicine

## 2015-06-06 ENCOUNTER — Encounter: Payer: Self-pay | Admitting: Family Medicine

## 2015-06-06 ENCOUNTER — Ambulatory Visit (HOSPITAL_BASED_OUTPATIENT_CLINIC_OR_DEPARTMENT_OTHER)
Admission: RE | Admit: 2015-06-06 | Discharge: 2015-06-06 | Disposition: A | Discharge status: Home / Self Care | Payer: Medicare Other | Source: Ambulatory Visit | Attending: Cardiology | Admitting: Cardiology

## 2015-06-06 ENCOUNTER — Ambulatory Visit (HOSPITAL_COMMUNITY)
Admission: RE | Admit: 2015-06-06 | Discharge: 2015-06-06 | Disposition: A | Discharge status: Home / Self Care | Payer: Medicare Other | Source: Ambulatory Visit | Attending: Cardiology | Admitting: Cardiology

## 2015-06-06 VITALS — BP 147/81 | HR 135 | Temp 98.3°F | Ht 71.0 in | Wt 278.6 lb

## 2015-06-06 DIAGNOSIS — Z0000 Encounter for general adult medical examination without abnormal findings: Secondary | ICD-10-CM | POA: Insufficient documentation

## 2015-06-06 DIAGNOSIS — I4891 Unspecified atrial fibrillation: Secondary | ICD-10-CM | POA: Diagnosis not present

## 2015-06-06 DIAGNOSIS — I4892 Unspecified atrial flutter: Secondary | ICD-10-CM | POA: Insufficient documentation

## 2015-06-06 DIAGNOSIS — I10 Essential (primary) hypertension: Secondary | ICD-10-CM | POA: Diagnosis not present

## 2015-06-06 DIAGNOSIS — I2510 Atherosclerotic heart disease of native coronary artery without angina pectoris: Secondary | ICD-10-CM | POA: Diagnosis not present

## 2015-06-06 MED ORDER — METOPROLOL TARTRATE 50 MG PO TABS: 50.0000 mg | ORAL_TABLET | Freq: Two times a day (BID) | ORAL | Status: DC

## 2015-06-06 NOTE — Patient Instructions (Signed)
I have increased your metoprolol, I have sent a new Rx  Lets follow up in 1 month.   Great to meet you!  If you see any signs of bleeding, including bloody stools or black stocky stools please seek medical help right away.

## 2015-06-06 NOTE — Progress Notes (Signed)
   HPI  Patient presents today to establish care.  He explains that he has 3 specialists that he sees, Dr. Michael Litter for cardiology, Dr. Jeffie Pollock for urology, and Dr. Luna Glasgow for gout.  He denies any chest pain, arm discomfort, or sensation of racing heart today. He states he feels well. 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  He denies any bleeding including hematochezia or melanoma, after starting Xarelto. He has stopped aspirin.    PMH: Smoking status noted Past medical, surgical, social, family history reviewed and updated in EMR ROS: Per HPI  Objective: BP 147/81 mmHg  Pulse 135  Temp(Src) 98.3 F (36.8 C) (Oral)  Ht 5\' 11"  (1.803 m)  Wt 278 lb 9.6 oz (126.372 kg)  BMI 38.87 kg/m2 Gen: NAD, alert, cooperative with exam HEENT: NCAT, TMs normal bilaterally, hearing aids in place, nares clear, oropharynx clear CV: Tachycardia Resp: CTABL, no wheezes, non-labored Ext: Trace to 1+ pitting edema Neuro: Alert and oriented, No gross deficits  Assessment and plan:  # Healthcare maintenance Refers to get his flu shot at his pharmacy Labs up-to-date  # Coronary artery disease status post CABG Continue statin, no aspirin due to anticoagulation with Coumadin Chest pain Continue beta blocker  # Atrial fibrillation Being followed by cardiology, currently with a Holter monitor placed Currently with tachycardia ranging from 110 to 130 Have discussed this with his cardiologist, appreciate recommendations, will change metoprolol from 12.5 BID to 50 twice a day Recently started xarelto, repeat CBC next month   Meds ordered this encounter  Medications  . metoprolol tartrate (LOPRESSOR) 50 MG tablet    Sig: Take 1 tablet (50 mg total) by mouth 2 (two) times daily.    Dispense:  60 tablet    Refill:  [place], MD [place] 06/06/2015, 5:16 PM

## 2015-06-07 ENCOUNTER — Other Ambulatory Visit: Payer: Self-pay | Admitting: Cardiology

## 2015-06-16 ENCOUNTER — Encounter: Payer: Self-pay | Admitting: Family Medicine

## 2015-06-16 ENCOUNTER — Ambulatory Visit (INDEPENDENT_AMBULATORY_CARE_PROVIDER_SITE_OTHER): Payer: Medicare Other | Admitting: Family Medicine

## 2015-06-16 VITALS — BP 137/88 | HR 96 | Temp 98.0°F | Ht 71.0 in | Wt 276.6 lb

## 2015-06-16 DIAGNOSIS — I4891 Unspecified atrial fibrillation: Secondary | ICD-10-CM | POA: Diagnosis not present

## 2015-06-16 NOTE — Patient Instructions (Signed)
Great to see you!  Try 1 to 2 tylenol for your shoulder pain, no more than 3 times a day

## 2015-06-16 NOTE — Progress Notes (Signed)
   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, he has intermittent R shoulder pain he used to take aspirin but has quit since he is now anticoagulated   PMH: Smoking status noted ROS: Per HPI  Objective: BP 137/88 mmHg  Pulse 96  Temp(Src) 98 F (36.7 C) (Oral)  Ht 5\' 11"  (1.803 m)  Wt 276 lb 9.6 oz (125.465 kg)  BMI 38.60 kg/m2 Gen: NAD, alert, cooperative with exam HEENT: NCAT CV: RRR, good S1/S2, no murmur Resp: CTABL, no wheezes, non-labored Ext: No edema, warm Neuro: Alert and oriented, No gross deficits  Assessment and plan:  # Atrial flutter At his last visit his rate was elevated and we increased metoprolol to 50 mg twice daily Rate controlled today Continue Xarelto  # Right shoulder pain Mild musculoskeletal shoulder pain from routine work Recommended Tylenol    Laroy Apple, MD [facility] 06/16/2015, 4:53 PM

## 2015-06-25 ENCOUNTER — Encounter (HOSPITAL_COMMUNITY): Payer: Self-pay | Admitting: Emergency Medicine

## 2015-06-25 ENCOUNTER — Inpatient Hospital Stay (HOSPITAL_COMMUNITY)
Admission: EM | Admit: 2015-06-25 | Discharge: 2015-06-28 | DRG: 310 | Disposition: A | Discharge status: Home / Self Care | Payer: Medicare Other | Attending: Cardiology | Admitting: Cardiology

## 2015-06-25 ENCOUNTER — Other Ambulatory Visit (HOSPITAL_COMMUNITY): Payer: Self-pay

## 2015-06-25 ENCOUNTER — Other Ambulatory Visit: Payer: Self-pay

## 2015-06-25 DIAGNOSIS — R42 Dizziness and giddiness: Secondary | ICD-10-CM | POA: Diagnosis not present

## 2015-06-25 DIAGNOSIS — I252 Old myocardial infarction: Secondary | ICD-10-CM | POA: Diagnosis not present

## 2015-06-25 DIAGNOSIS — I4430 Unspecified atrioventricular block: Secondary | ICD-10-CM | POA: Diagnosis present

## 2015-06-25 DIAGNOSIS — Z7901 Long term (current) use of anticoagulants: Secondary | ICD-10-CM | POA: Diagnosis not present

## 2015-06-25 DIAGNOSIS — I2510 Atherosclerotic heart disease of native coronary artery without angina pectoris: Secondary | ICD-10-CM | POA: Diagnosis present

## 2015-06-25 DIAGNOSIS — I484 Atypical atrial flutter: Secondary | ICD-10-CM | POA: Diagnosis not present

## 2015-06-25 DIAGNOSIS — M109 Gout, unspecified: Secondary | ICD-10-CM | POA: Diagnosis present

## 2015-06-25 DIAGNOSIS — Z8679 Personal history of other diseases of the circulatory system: Secondary | ICD-10-CM

## 2015-06-25 DIAGNOSIS — Z79899 Other long term (current) drug therapy: Secondary | ICD-10-CM

## 2015-06-25 DIAGNOSIS — I4891 Unspecified atrial fibrillation: Secondary | ICD-10-CM | POA: Diagnosis present

## 2015-06-25 DIAGNOSIS — Z951 Presence of aortocoronary bypass graft: Secondary | ICD-10-CM

## 2015-06-25 DIAGNOSIS — E7800 Pure hypercholesterolemia, unspecified: Secondary | ICD-10-CM | POA: Diagnosis present

## 2015-06-25 DIAGNOSIS — N400 Enlarged prostate without lower urinary tract symptoms: Secondary | ICD-10-CM | POA: Diagnosis present

## 2015-06-25 DIAGNOSIS — R001 Bradycardia, unspecified: Secondary | ICD-10-CM | POA: Diagnosis present

## 2015-06-25 DIAGNOSIS — M1990 Unspecified osteoarthritis, unspecified site: Secondary | ICD-10-CM | POA: Diagnosis present

## 2015-06-25 DIAGNOSIS — I4892 Unspecified atrial flutter: Secondary | ICD-10-CM | POA: Diagnosis present

## 2015-06-25 DIAGNOSIS — I10 Essential (primary) hypertension: Secondary | ICD-10-CM | POA: Diagnosis present

## 2015-06-25 DIAGNOSIS — E785 Hyperlipidemia, unspecified: Secondary | ICD-10-CM | POA: Diagnosis present

## 2015-06-25 DIAGNOSIS — Z9889 Other specified postprocedural states: Secondary | ICD-10-CM

## 2015-06-25 LAB — PHOSPHORUS: Phosphorus: 1.5 mg/dL — ABNORMAL LOW (ref 2.5–4.6)

## 2015-06-25 LAB — CBC WITH DIFFERENTIAL/PLATELET
Basophils Absolute: 0 10*3/uL (ref 0.0–0.1)
Basophils Relative: 0 %
EOS ABS: 0.1 10*3/uL (ref 0.0–0.7)
EOS PCT: 3 %
HCT: 38.8 % — ABNORMAL LOW (ref 39.0–52.0)
Hemoglobin: 12.9 g/dL — ABNORMAL LOW (ref 13.0–17.0)
LYMPHS ABS: 1.3 10*3/uL (ref 0.7–4.0)
LYMPHS PCT: 25 %
MCH: 30.1 pg (ref 26.0–34.0)
MCHC: 33.2 g/dL (ref 30.0–36.0)
MCV: 90.4 fL (ref 78.0–100.0)
MONO ABS: 0.4 10*3/uL (ref 0.1–1.0)
Monocytes Relative: 9 %
Neutro Abs: 3.2 10*3/uL (ref 1.7–7.7)
Neutrophils Relative %: 63 %
PLATELETS: 160 10*3/uL (ref 150–400)
RBC: 4.29 MIL/uL (ref 4.22–5.81)
RDW: 13.3 % (ref 11.5–15.5)
WBC: 5.1 10*3/uL (ref 4.0–10.5)

## 2015-06-25 LAB — COMPREHENSIVE METABOLIC PANEL
ALK PHOS: 87 U/L (ref 38–126)
ALT: 10 U/L — ABNORMAL LOW (ref 17–63)
AST: 18 U/L (ref 15–41)
Albumin: 3.2 g/dL — ABNORMAL LOW (ref 3.5–5.0)
Anion gap: 10 (ref 5–15)
BILIRUBIN TOTAL: 1 mg/dL (ref 0.3–1.2)
BUN: 11 mg/dL (ref 6–20)
CALCIUM: 8.6 mg/dL — AB (ref 8.9–10.3)
CO2: 26 mmol/L (ref 22–32)
Chloride: 99 mmol/L — ABNORMAL LOW (ref 101–111)
Creatinine, Ser: 1.01 mg/dL (ref 0.61–1.24)
GFR calc Af Amer: >60 mL/min (ref >60)
GFR calc non Af Amer: >60 mL/min (ref >60)
GLUCOSE: 114 mg/dL — AB (ref 65–99)
Potassium: 3.3 mmol/L — ABNORMAL LOW (ref 3.5–5.1)
Sodium: 135 mmol/L (ref 135–145)
TOTAL PROTEIN: 6.3 g/dL — AB (ref 6.5–8.1)

## 2015-06-25 LAB — MAGNESIUM: Magnesium: 1.8 mg/dL (ref 1.7–2.4)

## 2015-06-25 LAB — TSH: TSH: 3.886 u[IU]/mL (ref 0.350–4.500)

## 2015-06-25 LAB — TROPONIN I: Troponin I: <0.03 ng/mL (ref <0.031)

## 2015-06-25 MED ORDER — RIVAROXABAN 20 MG PO TABS
20.0000 mg | ORAL_TABLET | Freq: Every day | ORAL | Status: DC
  Administered 2015-06-25 – 2015-06-27 (×3): 20 mg via ORAL
  Filled 2015-06-25 (×3): qty 1

## 2015-06-25 MED ORDER — ALLOPURINOL 300 MG PO TABS
300.0000 mg | ORAL_TABLET | Freq: Every day | ORAL | Status: DC
  Administered 2015-06-26 – 2015-06-28 (×3): 300 mg via ORAL
  Filled 2015-06-25 (×3): qty 1

## 2015-06-25 MED ORDER — SODIUM CHLORIDE 0.9 % IV BOLUS (SEPSIS)
500.0000 mL | Freq: Once | INTRAVENOUS | Status: AC
  Administered 2015-06-25: 500 mL via INTRAVENOUS

## 2015-06-25 MED ORDER — ZOLPIDEM TARTRATE 5 MG PO TABS: 5.0000 mg | ORAL_TABLET | Freq: Every evening | ORAL | Status: DC | PRN

## 2015-06-25 MED ORDER — ONDANSETRON HCL 4 MG/2ML IJ SOLN: 4.0000 mg | Freq: Four times a day (QID) | INTRAMUSCULAR | Status: DC | PRN

## 2015-06-25 MED ORDER — DOXAZOSIN MESYLATE 4 MG PO TABS
2.0000 mg | ORAL_TABLET | Freq: Every day | ORAL | Status: DC
  Administered 2015-06-26 – 2015-06-27 (×2): 2 mg via ORAL
  Filled 2015-06-25: qty 1

## 2015-06-25 MED ORDER — RIVAROXABAN 20 MG PO TABS: 20.0000 mg | ORAL_TABLET | Freq: Every day | ORAL | Status: DC

## 2015-06-25 MED ORDER — FUROSEMIDE 40 MG PO TABS
40.0000 mg | ORAL_TABLET | Freq: Every day | ORAL | Status: DC
  Administered 2015-06-26 – 2015-06-28 (×3): 40 mg via ORAL
  Filled 2015-06-25 (×3): qty 1

## 2015-06-25 MED ORDER — ATORVASTATIN CALCIUM 80 MG PO TABS
80.0000 mg | ORAL_TABLET | Freq: Every day | ORAL | Status: DC
  Administered 2015-06-26 – 2015-06-28 (×3): 80 mg via ORAL
  Filled 2015-06-25 (×3): qty 1

## 2015-06-25 MED ORDER — AMLODIPINE BESYLATE 10 MG PO TABS
10.0000 mg | ORAL_TABLET | Freq: Two times a day (BID) | ORAL | Status: DC
  Administered 2015-06-25 – 2015-06-26 (×2): 10 mg via ORAL
  Filled 2015-06-25 (×2): qty 1

## 2015-06-25 MED ORDER — ACETAMINOPHEN 325 MG PO TABS: 650.0000 mg | ORAL_TABLET | Freq: Four times a day (QID) | ORAL | Status: DC | PRN

## 2015-06-25 MED ORDER — POLYETHYLENE GLYCOL 3350 17 G PO PACK: 17.0000 g | PACK | Freq: Every day | ORAL | Status: DC | PRN

## 2015-06-25 MED ORDER — ACETAMINOPHEN 650 MG RE SUPP: 650.0000 mg | Freq: Four times a day (QID) | RECTAL | Status: DC | PRN

## 2015-06-25 MED ORDER — ONDANSETRON HCL 4 MG PO TABS: 4.0000 mg | ORAL_TABLET | Freq: Four times a day (QID) | ORAL | Status: DC | PRN

## 2015-06-25 NOTE — ED Notes (Signed)
Per [facility], pt states he felt like he was going to pass out, pt has a complete AV block where he had no qrs for six seconds. Dr. Venora Maples on bedside. EKG normal except for these epsidoes. PT is AAOX4, no pain.

## 2015-06-25 NOTE — ED Notes (Signed)
Pacer pads applied to patient by [facility]

## 2015-06-25 NOTE — ED Provider Notes (Signed)
CSN: [id number]     Arrival date & time 06/25/15  1910 History   None    Chief Complaint  Patient presents with  . Heart Problem    HPI Patient presents to the emergency department with intermittent episodes of lightheadedness.  He has a history of coronary artery bypass grafting in 2013 status post Maze procedure for atrial fibrillation.  He was seen in the cardiology clinic on October 15 was noted to be an atrial flutter.  His been on anticoagulation at that time.  He reports since dinner he had intermittent episodes of lightheadedness.  When he notes his lightheadedness while in the room with both myself and while in transport with the EMS he becomes bradycardic with obvious flutter waves and no AV nodal conduction.  No syncope.  No chest pain or shortness of breath.  No tightness in his chest.   Past Medical History  Diagnosis Date  . Coronary atherosclerosis of native coronary artery   . Pure hypercholesterolemia   . Other and unspecified hyperlipidemia   . Hypertension   . Gout   . Angina   . Atrial flutter ([place])   . Chronic kidney disease     hx of BPH  . Arthritis   . Myocardial infarction [facility])    Past Surgical History  Procedure Laterality Date  . Cataracts    . Coronary artery bypass graft  11/20/2011    Procedure: CORONARY ARTERY BYPASS GRAFTING (CABG);  Surgeon: Ivin Poot, MD;  Location: [place];  Service: Open Heart Surgery;  Laterality: N/A;  Times 3. On Pump. Using endoscopically harvested right greater saphenous vein and left internal mammary artery.   . Maze  11/20/2011    Procedure: MAZE;  Surgeon: Ivin Poot, MD;  Location: [place];  Service: Open Heart Surgery;  Laterality: N/A;  . Chest tube insertion  11/22/2011    Procedure: CHEST TUBE INSERTION;  Surgeon: Ivin Poot, MD;  Location: [place];  Service: Thoracic;  Laterality: Right;  . Left and right heart catheterization with coronary angiogram N/A 11/16/2011    Procedure: LEFT AND RIGHT HEART  CATHETERIZATION WITH CORONARY ANGIOGRAM;  Surgeon: Sherren Mocha, MD;  [facility] C[facility] LAB;  Service: Cardiovascular;  Laterality: N/A;   Family History  Problem Relation Age of Onset  . Colon cancer Son    Social History  Substance Use Topics  . Smoking status: Never Smoker   . Smokeless tobacco: Never Used  . Alcohol Use: Yes     Comment: Occasional     Review of Systems  All other systems reviewed and are negative.     Allergies  Review of patient's allergies indicates no known allergies.  Home Medications   Prior to Admission medications   Medication Sig Start Date End Date Taking? Authorizing Provider  allopurinol (ZYLOPRIM) 300 MG tablet Take 300 mg by mouth daily.      Historical Provider, MD  amLODipine (NORVASC) 10 MG tablet Take 10 mg by mouth daily. Take two tablets 20mg  by mouth daily    Historical Provider, MD  atorvastatin (LIPITOR) 80 MG tablet TAKE ONE TABLET BY MOUTH ONE TIME DAILY 04/25/15   Minus Breeding, MD  doxazosin (CARDURA) 4 MG tablet Take 0.5 tablets (2 mg total) by mouth every evening. 01/22/12 06/01/15  Ezra Sites, MD  ferrous gluconate (FERGON) 324 MG tablet Take 1 tablet (324 mg total) by mouth 2 (two) times daily with a meal. 11/29/11 06/01/15  Lodema Hong Barrett, PA-C  furosemide (LASIX) 40 MG tablet TAKE ONE TABLET BY MOUTH ONE TIME DAILY 05/17/15   Minus Breeding, MD  metoprolol tartrate (LOPRESSOR) 50 MG tablet Take 1 tablet (50 mg total) by mouth 2 (two) times daily. 06/06/15   Timmothy Euler, MD  potassium chloride SA (K-DUR,KLOR-CON) 20 MEQ tablet TAKE ONE TABLET BY MOUTH TWICE DAILY 06/07/15   Minus Breeding, MD  rivaroxaban (XARELTO) 20 MG TABS tablet Take 1 tablet (20 mg total) by mouth daily with supper. 06/01/15   Minus Breeding, MD   There were no vitals taken for this visit. Physical Exam  Constitutional: He is oriented to person, place, and time. He appears well-developed and well-nourished.  HENT:  Head: Normocephalic and  atraumatic.  Eyes: EOM are normal.  Neck: Normal range of motion.  Cardiovascular: Normal rate, regular rhythm, normal heart sounds and intact distal pulses.   Pulmonary/Chest: Effort normal and breath sounds normal. No respiratory distress.  Abdominal: Soft. He exhibits no distension. There is no tenderness.  Musculoskeletal: Normal range of motion.  Neurological: He is alert and oriented to person, place, and time.  Skin: Skin is warm and dry.  Psychiatric: He has a normal mood and affect. Judgment normal.  Nursing note and vitals reviewed.   ED Course  Procedures (including critical care time)  CRITICAL CARE Performed by: Hoy Morn Total critical care time: 32 minutes Critical care time was exclusive of separately billable procedures and treating other patients. Critical care was necessary to treat or prevent imminent or life-threatening deterioration. Critical care was time spent personally by me on the following activities: development of treatment plan with patient and/or surrogate as well as nursing, discussions with consultants, evaluation of patient's response to treatment, examination of patient, obtaining history from patient or surrogate, ordering and performing treatments and interventions, ordering and review of laboratory studies, ordering and review of radiographic studies, pulse oximetry and re-evaluation of patient's condition.   Labs Review Labs Reviewed  CBC WITH DIFFERENTIAL/PLATELET - Abnormal; Notable for the following:    Hemoglobin 12.9 (*)    HCT 38.8 (*)    All other components within normal limits  COMPREHENSIVE METABOLIC PANEL - Abnormal; Notable for the following:    Potassium 3.3 (*)    Chloride 99 (*)    Glucose, Bld 114 (*)    Calcium 8.6 (*)    Total Protein 6.3 (*)    Albumin 3.2 (*)    ALT 10 (*)    All other components within normal limits  PHOSPHORUS - Abnormal; Notable for the following:    Phosphorus 1.5 (*)    All other components  within normal limits  MAGNESIUM  TSH  TROPONIN I    Imaging Review No results found. I have personally reviewed and evaluated these images and lab results as part of my medical decision-making.  ECG interpretation   Date: 06/25/2015  Rate: 87  Rhythm: Atrial flutter  QRS Axis: normal  Intervals: normal  ST/T Wave abnormalities: normal  Conduction Disutrbances: none  Narrative Interpretation:   Old EKG Reviewed: No significant changes noted     MDM   Final diagnoses:  AV heart block    Symptomatic bradycardia.  Patient placed on pacer pads.  No syncope to this point.  Beta blocker could be leading to this.  He is not on calcium channel blockers.  Cardiology consultation.  He will have intermittent episodes of complete AV nodal blockade.  He has a variable block with flutter at baseline  [facility],  MD 06/25/15 2111

## 2015-06-25 NOTE — ED Notes (Signed)
Attempted report 

## 2015-06-25 NOTE — H&P (Signed)
CARDIOLOGY INPATIENT HISTORY AND PHYSICAL EXAMINATION NOTE  Patient ID: Marcus [name] MRN: [id number], DOB/AGE: Dec 26, 1932   Admit date: 06/25/2015   Primary Physician: Kenn File, MD Primary Cardiologist: Estell Harpin MD  Reason for admission: syncope/presyncope  HPI: This is a 81 y.o. male with known history of CAD s/p CABG, HTN, HLD, BPH and recent diagnosis of atrial flutter with prior MAZE procedure for atrial fibrillation in 2013 and obesity.  Patient has been doing well except occasaional episodes of palpitations. His medication metoprolol was recently increased to 50 mg by the family medicine physician and he refilled it on 10/20. Today he started having episodes of lightheadedness, dizziness and feeling of passing out. He was at [employer] today and had several episodes of presyncope at that time. He came to the ED. He denied chest pain, dyspnea, PND, orthopnea,  increased abdominal girth, palpitations. He has chronic leg swelling.  Of note, patient recently saw Dr. Percival Spanish and was found to be in atrial flutter at that time. He was started on anticoagulation and aspirin was stopped. Only rate control approach was planned at that time since patient had a prior history of atrial fibrillation. He denied any palpitations or other symptoms at that time. At that time, 24 holter showed that the lowest HR was 57 and highest was 145 bpm. After that patient followed up with family medicine clinic on 10/20 and his metoprolol dose was increased to 4 times (50 mg BID) from 12.5 mg BID since his HR was elevated.  EKG/telemetry - today showed longest interval of 8 s pause Atrial flutter, rate 96, or left axis deviation, old anteroseptal infarct, lateral T-wave inversions unchanged from previous. Echo 06/06/2015 - low normal LVEF 50-55% with mild PHTN 37 mHg, moderate LA dilatation, normal LV, no LVH and no valve dz.   Problem List: Past Medical History  Diagnosis Date  . Coronary  atherosclerosis of native coronary artery   . Pure hypercholesterolemia   . Other and unspecified hyperlipidemia   . Hypertension   . Gout   . Angina   . Atrial flutter ([place])   . Chronic kidney disease     hx of BPH  . Arthritis   . Myocardial infarction [facility])     Past Surgical History  Procedure Laterality Date  . Cataracts    . Coronary artery bypass graft  11/20/2011    Procedure: CORONARY ARTERY BYPASS GRAFTING (CABG);  Surgeon: Ivin Poot, MD;  Location: [place];  Service: Open Heart Surgery;  Laterality: N/A;  Times 3. On Pump. Using endoscopically harvested right greater saphenous vein and left internal mammary artery.   . Maze  11/20/2011    Procedure: MAZE;  Surgeon: Ivin Poot, MD;  Location: [place];  Service: Open Heart Surgery;  Laterality: N/A;  . Chest tube insertion  11/22/2011    Procedure: CHEST TUBE INSERTION;  Surgeon: Ivin Poot, MD;  Location: [place];  Service: Thoracic;  Laterality: Right;  . Left and right heart catheterization with coronary angiogram N/A 11/16/2011    Procedure: LEFT AND RIGHT HEART CATHETERIZATION WITH CORONARY ANGIOGRAM;  Surgeon: Sherren Mocha, MD;  Location: [facility] CATH LAB;  Service: Cardiovascular;  Laterality: N/A;     Allergies: No Known Allergies   Home Medications Current Facility-Administered Medications  Medication Dose Route Frequency Provider Last Rate Last Dose  . acetaminophen (TYLENOL) tablet 650 mg  650 mg Oral Q6H PRN Flossie Dibble, MD       Or  .  acetaminophen (TYLENOL) suppository 650 mg  650 mg Rectal Q6H PRN Flossie Dibble, MD      . allopurinol (ZYLOPRIM) tablet 300 mg  300 mg Oral Daily Flossie Dibble, MD      . amLODipine (NORVASC) tablet 10 mg  10 mg Oral BID Flossie Dibble, MD   10 mg at 06/25/15 2321  . atorvastatin (LIPITOR) tablet 80 mg  80 mg Oral Daily Flossie Dibble, MD      . doxazosin (CARDURA) tablet 2 mg  2 mg Oral QPC supper Flossie Dibble, MD      . furosemide (LASIX) tablet 40 mg  40 mg  Oral Daily Flossie Dibble, MD      . ondansetron [facility]) tablet 4 mg  4 mg Oral Q6H PRN Flossie Dibble, MD       Or  . ondansetron (ZOFRAN) injection 4 mg  4 mg Intravenous Q6H PRN Flossie Dibble, MD      . polyethylene glycol (MIRALAX / GLYCOLAX) packet 17 g  17 g Oral Daily PRN Flossie Dibble, MD      . rivaroxaban [facility]) tablet 20 mg  20 mg Oral QPC supper Jake Church Masters, RPH   20 mg at 06/25/15 2321  . zolpidem (AMBIEN) tablet 5 mg  5 mg Oral QHS PRN Flossie Dibble, MD         Family History  Problem Relation Age of Onset  . Colon cancer Son      Social History   Social History  . Marital Status: Married    Spouse Name: [name]  . Number of Children: N/A  . Years of Education: N/A   Occupational History  . works with "heavy equipment"    Social History Main Topics  . Smoking status: Never Smoker   . Smokeless tobacco: Never Used  . Alcohol Use: Yes     Comment: Occasional   . Drug Use: No  . Sexual Activity: No   Other Topics Concern  . Not on file   Social History Narrative     Review of Systems: General: negative for chills, fever, night sweats or weight changes.  Cardiovascular: leg edema, syncope no chest pain  Dermatological: negative for rash Respiratory: negative for cough or wheezing Urologic: negative for hematuria Abdominal: negative for nausea, vomiting, diarrhea, bright red blood per rectum, melena, or hematemesis Neurologic: negative for visual changes, syncope, or dizziness Endocrine: no diabetes, no hypothyroidism Immunological: no lymph adenopathy Psych: non homicidal/suicidal  Physical Exam: Vitals: BP 158/89 mmHg  Pulse 107  Temp(Src) 98.3 F (36.8 C) (Oral)  Resp 20  Ht 5\' 11"  (1.803 m)  Wt 124.648 kg (274 lb 12.8 oz)  BMI 38.34 kg/m2  SpO2 100% General: not in acute distress Neck: JVP flat, neck supple Heart: irregular rate and rhythm, S2, no murmurs  Lungs: CTAB  GI: non tender, non distended, bowel sounds  present Extremities: 2+ edema Neuro: AAO x 3  Psych: normal affect, no anxiety   Labs:   Results for orders placed or performed during the hospital encounter of 06/25/15 (from the past 24 hour(s))  CBC with Differential/Platelet     Status: Abnormal   Collection Time: 06/25/15  7:32 PM  Result Value Ref Range   WBC 5.1 4.0 - 10.5 K/uL   RBC 4.29 4.22 - 5.81 MIL/uL   Hemoglobin 12.9 (L) 13.0 - 17.0 g/dL   HCT 38.8 (L) 39.0 - 52.0 %   MCV 90.4 78.0 -  100.0 fL   MCH 30.1 26.0 - 34.0 pg   MCHC 33.2 30.0 - 36.0 g/dL   RDW 13.3 11.5 - 15.5 %   Platelets 160 150 - 400 K/uL   Neutrophils Relative % 63 %   Neutro Abs 3.2 1.7 - 7.7 K/uL   Lymphocytes Relative 25 %   Lymphs Abs 1.3 0.7 - 4.0 K/uL   Monocytes Relative 9 %   Monocytes Absolute 0.4 0.1 - 1.0 K/uL   Eosinophils Relative 3 %   Eosinophils Absolute 0.1 0.0 - 0.7 K/uL   Basophils Relative 0 %   Basophils Absolute 0.0 0.0 - 0.1 K/uL  Comprehensive metabolic panel     Status: Abnormal   Collection Time: 06/25/15  7:32 PM  Result Value Ref Range   Sodium 135 135 - 145 mmol/L   Potassium 3.3 (L) 3.5 - 5.1 mmol/L   Chloride 99 (L) 101 - 111 mmol/L   CO2 26 22 - 32 mmol/L   Glucose, Bld 114 (H) 65 - 99 mg/dL   BUN 11 6 - 20 mg/dL   Creatinine, Ser 1.01 0.61 - 1.24 mg/dL   Calcium 8.6 (L) 8.9 - 10.3 mg/dL   Total Protein 6.3 (L) 6.5 - 8.1 g/dL   Albumin 3.2 (L) 3.5 - 5.0 g/dL   AST 18 15 - 41 U/L   ALT 10 (L) 17 - 63 U/L   Alkaline Phosphatase 87 38 - 126 U/L   Total Bilirubin 1.0 0.3 - 1.2 mg/dL   GFR calc non Af Amer >60 >60 mL/min   GFR calc Af Amer >60 >60 mL/min   Anion gap 10 5 - 15  Magnesium     Status: None   Collection Time: 06/25/15  7:32 PM  Result Value Ref Range   Magnesium 1.8 1.7 - 2.4 mg/dL  Phosphorus     Status: Abnormal   Collection Time: 06/25/15  7:32 PM  Result Value Ref Range   Phosphorus 1.5 (L) 2.5 - 4.6 mg/dL  TSH     Status: None   Collection Time: 06/25/15  7:32 PM  Result Value Ref  Range   TSH 3.886 0.350 - 4.500 uIU/mL  Troponin I     Status: None   Collection Time: 06/25/15  7:32 PM  Result Value Ref Range   Troponin I <0.03 <0.031 ng/mL  TSH     Status: None   Collection Time: 06/26/15 12:15 AM  Result Value Ref Range   TSH 2.475 0.350 - 4.500 uIU/mL  Brain natriuretic peptide     Status: None   Collection Time: 06/26/15 12:18 AM  Result Value Ref Range   B Natriuretic Peptide 47.9 0.0 - 100.0 pg/mL  Troponin I     Status: None   Collection Time: 06/26/15 12:18 AM  Result Value Ref Range   Troponin I <0.03 <0.031 ng/mL  Basic metabolic panel     Status: Abnormal   Collection Time: 06/26/15  4:49 AM  Result Value Ref Range   Sodium 135 135 - 145 mmol/L   Potassium 3.9 3.5 - 5.1 mmol/L   Chloride 100 (L) 101 - 111 mmol/L   CO2 28 22 - 32 mmol/L   Glucose, Bld 101 (H) 65 - 99 mg/dL   BUN 9 6 - 20 mg/dL   Creatinine, Ser 1.02 0.61 - 1.24 mg/dL   Calcium 8.4 (L) 8.9 - 10.3 mg/dL   GFR calc non Af Amer >60 >60 mL/min   GFR calc Af Amer >60 >60  mL/min   Anion gap 7 5 - 15  CBC     Status: Abnormal   Collection Time: 06/26/15  4:49 AM  Result Value Ref Range   WBC 5.3 4.0 - 10.5 K/uL   RBC 4.17 (L) 4.22 - 5.81 MIL/uL   Hemoglobin 12.4 (L) 13.0 - 17.0 g/dL   HCT 38.2 (L) 39.0 - 52.0 %   MCV 91.6 78.0 - 100.0 fL   MCH 29.7 26.0 - 34.0 pg   MCHC 32.5 30.0 - 36.0 g/dL   RDW 13.1 11.5 - 15.5 %   Platelets 160 150 - 400 K/uL     Radiology/Studies: No results found.  EKG: 98 bpm vent rate, with probably atypical atrial flutter, with septal infarct pattern and prolonged QT  Echo 06/06/2015 - low normal LVEF 50-55% with mild PHTN 37 mHg, moderate LA dilatation, normal LV, no LVH and no valve dz.   Medical decision making:  Discussed care with the patient Discussed care with the physician on the phone Reviewed labs and imaging personally Reviewed prior records  ASSESSMENT AND PLAN:  This is a 81 y.o. male with CAD s/p CABG, HTN, HLD, BPH and recent  diagnosis of atrial flutter with prior MAZE procedure for atrial fibrillation in 2013 and obesity who presented with syncope and pauses >8 seconds in setting of increase in dose of metoprolol 9 days ago.    Principal Problem:   Symptomatic bradycardia Active Problems:   Pure hypercholesterolemia   CORONARY ATHEROSCLEROSIS NATIVE CORONARY ARTERY   HTN (hypertension)   Dizziness   Status post coronary artery bypass grafting   S/P Maze operation for atrial fibrillation   Atrial flutter ([facility])   AV block  Syncope and collapse with bradycardia, cardiogenic in origin  Holding metoprolol, HR increased, consider reintroducing [place] dose of metoprolol that patient was able to tolerate 12.5 mg BID after evaluation in the AM If continues to have syncopal events >48 hours observation, will probably need PPM  Atrial flutter/possible typical vs. Atypical Consider outpatient evaluation for ablation. If continues to have syncopal episodes, probably will need to stop xarelto and start on IV heparin  Chronic CAD - continue home meds HTN - continue amlodipine BPH - on doxazocin HLD - cont lipitor 80 mg    Signed, Flossie Dibble, MD MS 06/26/2015, 6:33 AM

## 2015-06-26 DIAGNOSIS — I4430 Unspecified atrioventricular block: Secondary | ICD-10-CM | POA: Insufficient documentation

## 2015-06-26 LAB — TSH: TSH: 2.475 u[IU]/mL (ref 0.350–4.500)

## 2015-06-26 LAB — BASIC METABOLIC PANEL
Anion gap: 7 (ref 5–15)
BUN: 9 mg/dL (ref 6–20)
CALCIUM: 8.4 mg/dL — AB (ref 8.9–10.3)
CHLORIDE: 100 mmol/L — AB (ref 101–111)
CO2: 28 mmol/L (ref 22–32)
CREATININE: 1.02 mg/dL (ref 0.61–1.24)
GFR calc Af Amer: >60 mL/min (ref >60)
GFR calc non Af Amer: >60 mL/min (ref >60)
Glucose, Bld: 101 mg/dL — ABNORMAL HIGH (ref 65–99)
Potassium: 3.9 mmol/L (ref 3.5–5.1)
SODIUM: 135 mmol/L (ref 135–145)

## 2015-06-26 LAB — CBC
HCT: 38.2 % — ABNORMAL LOW (ref 39.0–52.0)
Hemoglobin: 12.4 g/dL — ABNORMAL LOW (ref 13.0–17.0)
MCH: 29.7 pg (ref 26.0–34.0)
MCHC: 32.5 g/dL (ref 30.0–36.0)
MCV: 91.6 fL (ref 78.0–100.0)
PLATELETS: 160 10*3/uL (ref 150–400)
RBC: 4.17 MIL/uL — ABNORMAL LOW (ref 4.22–5.81)
RDW: 13.1 % (ref 11.5–15.5)
WBC: 5.3 10*3/uL (ref 4.0–10.5)

## 2015-06-26 LAB — TROPONIN I

## 2015-06-26 LAB — BRAIN NATRIURETIC PEPTIDE: B Natriuretic Peptide: 47.9 pg/mL (ref 0.0–100.0)

## 2015-06-26 MED ORDER — AMLODIPINE BESYLATE 10 MG PO TABS
10.0000 mg | ORAL_TABLET | Freq: Every day | ORAL | Status: DC
  Administered 2015-06-27 – 2015-06-28 (×2): 10 mg via ORAL
  Filled 2015-06-26 (×2): qty 1

## 2015-06-26 NOTE — Progress Notes (Signed)
Subjective:  No recurrence of bradycardia overnight.  Metoprolol currently held with increase in atrial flutter rate.  No complaints of shortness of breath or chest pain.  Objective:  Vital Signs in the last 24 hours: BP 148/81 mmHg  Pulse 131  Temp(Src) 97.8 F (36.6 C) (Oral)  Resp 20  Ht 5\' 11"  (1.803 m)  Wt 123.016 kg (271 lb 3.2 oz)  BMI 37.84 kg/m2  SpO2 98%  Physical Exam: Obese white male hard of hearing but in no acute distress Lungs:  Clear Cardiac: Irregular rhythm, normal S1 and S2, no S3 Abdomen:  Soft, nontender, no masses Extremities:  No edema present  Intake/Output from previous day: 10/29 0701 - 10/30 0700 In: 240 [P.O.:240] Out: 550 [Urine:550]  Weight Filed Weights   06/25/15 2254 06/26/15 0500  Weight: 124.648 kg (274 lb 12.8 oz) 123.016 kg (271 lb 3.2 oz)    Lab Results: Basic Metabolic Panel:  Recent Labs  06/25/15 1932 06/26/15 0449  NA 135 135  K 3.3* 3.9  CL 99* 100*  CO2 26 28  GLUCOSE 114* 101*  BUN 11 9  CREATININE 1.01 1.02   CBC:  Recent Labs  06/25/15 1932 06/26/15 0449  WBC 5.1 5.3  NEUTROABS 3.2  --   HGB 12.9* 12.4*  HCT 38.8* 38.2*  MCV 90.4 91.6  PLT 160 160   Cardiac Panel (last 3 results)  Recent Labs  06/25/15 1932 06/26/15 0018  TROPONINI <0.03 <0.03    Telemetry: Atrial flutter no bradycardia noted since admission   Assessment/Plan:  1.  Recent onset of atrial flutter on October 5-he has been anticoagulated now about 3 weeks.   2.  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  Coronary artery disease with previous bypass grafting 4.  Obesity  Recommendations:  He may benefit from an atrial flutter ablation which may resolve the pauses.  If not he likely will need to have a pacemaker.  Will keep nothing by mouth after midnight for possible ablation tomorrow.  He has been anticoagulated for 3 weeks so doubt this needs to be done with TEE guidance.  Will  need an EP consult in the morning.    Note recent echo on October 10 showed EF of around 55% and moderate left atrial enlargement.   Kerry Hough  MD [facility] Cardiology  06/26/2015, 8:45 AM

## 2015-06-27 ENCOUNTER — Encounter (HOSPITAL_COMMUNITY): Payer: Self-pay | Admitting: Internal Medicine

## 2015-06-27 ENCOUNTER — Encounter (HOSPITAL_COMMUNITY)
Admission: EM | Disposition: A | Discharge status: Home / Self Care | Payer: Self-pay | Source: Home / Self Care | Attending: Cardiology

## 2015-06-27 DIAGNOSIS — Z8679 Personal history of other diseases of the circulatory system: Secondary | ICD-10-CM

## 2015-06-27 DIAGNOSIS — Z9889 Other specified postprocedural states: Secondary | ICD-10-CM

## 2015-06-27 DIAGNOSIS — I2510 Atherosclerotic heart disease of native coronary artery without angina pectoris: Secondary | ICD-10-CM

## 2015-06-27 HISTORY — PX: ELECTROPHYSIOLOGIC STUDY: SHX172A

## 2015-06-27 LAB — BASIC METABOLIC PANEL
ANION GAP: 5 (ref 5–15)
BUN: 9 mg/dL (ref 6–20)
CALCIUM: 8.7 mg/dL — AB (ref 8.9–10.3)
CO2: 29 mmol/L (ref 22–32)
Chloride: 106 mmol/L (ref 101–111)
Creatinine, Ser: 1.06 mg/dL (ref 0.61–1.24)
GLUCOSE: 97 mg/dL (ref 65–99)
POTASSIUM: 3.5 mmol/L (ref 3.5–5.1)
Sodium: 140 mmol/L (ref 135–145)

## 2015-06-27 LAB — CBC
HCT: 38.3 % — ABNORMAL LOW (ref 39.0–52.0)
HEMOGLOBIN: 12.4 g/dL — AB (ref 13.0–17.0)
MCH: 29.7 pg (ref 26.0–34.0)
MCHC: 32.4 g/dL (ref 30.0–36.0)
MCV: 91.6 fL (ref 78.0–100.0)
PLATELETS: 154 10*3/uL (ref 150–400)
RBC: 4.18 MIL/uL — AB (ref 4.22–5.81)
RDW: 13.1 % (ref 11.5–15.5)
WBC: 5.5 10*3/uL (ref 4.0–10.5)

## 2015-06-27 LAB — GLUCOSE, CAPILLARY: GLUCOSE-CAPILLARY: 96 mg/dL (ref 65–99)

## 2015-06-27 SURGERY — CARDIOVERSION (CATH LAB)

## 2015-06-27 MED ORDER — FENTANYL CITRATE (PF) 100 MCG/2ML IJ SOLN
INTRAMUSCULAR | Status: AC
  Filled 2015-06-27: qty 4

## 2015-06-27 MED ORDER — METOPROLOL TARTRATE 25 MG PO TABS: 25.0000 mg | ORAL_TABLET | Freq: Two times a day (BID) | ORAL | Status: DC

## 2015-06-27 MED ORDER — MIDAZOLAM HCL 5 MG/5ML IJ SOLN
INTRAMUSCULAR | Status: AC
  Filled 2015-06-27: qty 25

## 2015-06-27 MED ORDER — FENTANYL CITRATE (PF) 100 MCG/2ML IJ SOLN
INTRAMUSCULAR | Status: DC | PRN
  Administered 2015-06-27: 25 ug via INTRAVENOUS
  Administered 2015-06-27: 50 ug via INTRAVENOUS
  Administered 2015-06-27: 25 ug via INTRAVENOUS

## 2015-06-27 MED ORDER — SODIUM CHLORIDE 0.9 % IV SOLN: 250.0000 mL | INTRAVENOUS | Status: DC

## 2015-06-27 MED ORDER — SODIUM CHLORIDE 0.9 % IV SOLN: INTRAVENOUS | Status: DC

## 2015-06-27 MED ORDER — MIDAZOLAM HCL 5 MG/5ML IJ SOLN
INTRAMUSCULAR | Status: DC | PRN
  Administered 2015-06-27: 1 mg via INTRAVENOUS
  Administered 2015-06-27: 3 mg via INTRAVENOUS
  Administered 2015-06-27 (×2): 1 mg via INTRAVENOUS

## 2015-06-27 MED ORDER — SODIUM CHLORIDE 0.9 % IJ SOLN: 3.0000 mL | INTRAMUSCULAR | Status: DC | PRN

## 2015-06-27 MED ORDER — SODIUM CHLORIDE 0.9 % IJ SOLN
3.0000 mL | Freq: Two times a day (BID) | INTRAMUSCULAR | Status: DC
  Administered 2015-06-28: 3 mL via INTRAVENOUS

## 2015-06-27 SURGICAL SUPPLY — 1 items: PAD DEFIB LIFELINK (PAD) ×3 IMPLANT

## 2015-06-27 NOTE — Progress Notes (Signed)
Patient Name: Marcus [name] Date of Encounter: 06/27/2015     Principal Problem:   Symptomatic bradycardia Active Problems:   Pure hypercholesterolemia   CORONARY ATHEROSCLEROSIS NATIVE CORONARY ARTERY   HTN (hypertension)   Dizziness   Status post coronary artery bypass grafting   S/P Maze operation for atrial fibrillation   Atrial flutter (HCC)   AV block   AV heart block    SUBJECTIVE  No complaints. No CP or SOB.   CURRENT MEDS . allopurinol  300 mg Oral Daily  . amLODipine  10 mg Oral Daily  . atorvastatin  80 mg Oral Daily  . doxazosin  2 mg Oral QPC supper  . furosemide  40 mg Oral Daily  . rivaroxaban  20 mg Oral QPC supper    OBJECTIVE  Filed Vitals:   06/26/15 1028 06/26/15 1319 06/26/15 2007 06/27/15 0423  BP: 120/78 154/73 155/82 135/78  Pulse:  109 96 94  Temp:  98.7 F (37.1 C) 98.2 F (36.8 C) 98.4 F (36.9 C)  TempSrc:  Oral Oral Oral  Resp:  17    Height:      Weight:    279 lb 1.6 oz (126.6 kg)  SpO2:  95% 93% 94%    Intake/Output Summary (Last 24 hours) at 06/27/15 0802 Last data filed at 06/27/15 0753  Gross per 24 hour  Intake    240 ml  Output    795 ml  Net   -555 ml   Filed Weights   06/25/15 2254 06/26/15 0500 06/27/15 0423  Weight: 274 lb 12.8 oz (124.648 kg) 271 lb 3.2 oz (123.016 kg) 279 lb 1.6 oz (126.6 kg)    PHYSICAL EXAM  General: Pleasant, NAD. Neuro: Alert and oriented X 3. Moves all extremities spontaneously. Psych: Normal affect. HEENT:  Normal  Neck: Supple without bruits or JVD. Lungs:  Resp regular and unlabored, CTA. Heart: tachycardic RRR no s3, s4, or murmurs. Abdomen: Soft, non-tender, non-distended, BS + x 4.  Extremities: No clubbing, cyanosis or edema. DP/PT/Radials 2+ and equal bilaterally.  Accessory Clinical Findings  CBC  Recent Labs  06/25/15 1932 06/26/15 0449 06/27/15 0353  WBC 5.1 5.3 5.5  NEUTROABS 3.2  --   --   HGB 12.9* 12.4* 12.4*  HCT 38.8* 38.2* 38.3*  MCV 90.4 91.6  91.6  PLT 160 160 527   Basic Metabolic Panel  Recent Labs  06/25/15 1932 06/26/15 0449 06/27/15 0353  NA 135 135 140  K 3.3* 3.9 3.5  CL 99* 100* 106  CO2 26 28 29   GLUCOSE 114* 101* 97  BUN 11 9 9   CREATININE 1.01 1.02 1.06  CALCIUM 8.6* 8.4* 8.7*  MG 1.8  --   --   PHOS 1.5*  --   --    Liver Function Tests  Recent Labs  06/25/15 1932  AST 18  ALT 10*  ALKPHOS 87  BILITOT 1.0  PROT 6.3*  ALBUMIN 3.2*    Cardiac Enzymes  Recent Labs  06/25/15 1932 06/26/15 0018  TROPONINI <0.03 <0.03    Thyroid Function Tests  Recent Labs  06/26/15 0015  TSH 2.475    TELE  Atrial flutter with HRs in 120s currently> HRs up to 170s earlier this AM.   Radiology/Studies  No results found.  ASSESSMENT AND PLAN  This is a 81 y.o. male with CAD s/p CABG, HTN, HLD, BPH and recent diagnosis of atrial flutter with prior MAZE procedure for atrial fibrillation in 2013 and obesity  who presented with syncope and pauses >8 seconds in setting of increase in dose of metoprolol 9 days ago.   Syncope and collapse with bradycardia -- Holding metoprolol, HR increased and no further pauses. ( has pauses up to 8 seconds on admission)   Atrial flutter/possible typical vs. Atypical/Tachybrady syndrome -- TSH normal.  -- Recent onset of atrial flutter (incidental finding on Oct 5 - he is has been anticoagulated now for ~ 3 weeks). Could proceed with DCCV without TEE. -- Will consult EP to see if he is aflutter ablation candidate. He has had previous MAZE procedure in 2013. Otherwise he will likely need a PPM for tachybrady syndrome -- CHA2DS2 - VASc score of 4- continue Xarelto.   Chronic CAD s/p CABG - continue home meds. Cardiac enzymes negative. HTN - continue amlodipine BPH - on doxazocin HLD - cont lipitor 80 mg    Signed, Eileen Stanford PA-C  Pager [phone]  I have seen and examined the patient along with Angelena Form R PA-C.  I have reviewed the chart, notes  and new data.  I agree with PA's note.  Key new complaints: asymptomatic Key examination changes: irregular rhythm, no overt CHF signs Key new findings / data: atrial flutter with variable AV block. Had up to 8" pauses on metoprolol 50 mg BID. Had heart rate intermittently up to 135 on metoprolol 25 mg BID. Currently HR 95 at rest, 150s walking to bathroom, off metoprolol. Rate and ECG morphology are not suggestive of typical counterclockwise right atrial flutter.  PLAN: Suspect atypical (left atrial?) flutter in this patient with previous MAZE at time of CABG. While ablation should be considered, it may not be easily accomplished. Consider cardioversion, but this will probably be a short term solution. Antiarrhythmics may worsen tendency to have symptomatic high grade AV block and bradycardia/pauses. He has been on uninterrupted anticoagulation. He will likely continue to have problems with alternating rapid and slow rates regardless of how we "tweak" his meds. It may be most efficient to proceed with dual chamber pacemaker implantation. Will defer to EP consultant.  Sanda Klein, MD, [place] [phone] 06/27/2015, 9:45 AM

## 2015-06-27 NOTE — Progress Notes (Signed)
Cm

## 2015-06-27 NOTE — Consult Note (Signed)
Reason for Consult: a flutter, bradycardia 8 sec pauses   Referring Physician: Dr. Sallyanne Kuster   PCP:  Kenn File, MD  Primary Cardiologist:Dr. Gilman Buttner is an 81 y.o. male.    Chief Complaint: pt admitted 06/26/15 with syncope/presyncope   HPI: 81 year old male was admitted yesterday with lightheadedness, dizziness and feeling of passing out.  He was sitting to eat lunch and developed episode of everything went black.  He believes he went out.  On EMS ride pt feels he would pass out but EMS would still get response from him.  Admitted BB stopped now in a flutter with HR up to 187 with exertion.    Pt with hx of CAD hx CABG in 2013 with LIMA-LAD, VG-OM and VG to acute marginal and Lt sided MAZE with oversew of the LA appendaage.  Also HTN, HLD, BPH and recent a flutter with prior MAZE procedure with CABG in 2013. On OV 06/01/15 pt was in a flutter and not aware he was having.  He was placed on Xarelto for CHA2DS2 - VASc score of 4 with a risk of stroke of 4%.  Holter monitor was placed 24hr and a flutter was with reasonable control.   " Rhythm is atrial flutter with variable conduction. Heart rate range is from 57 to 145 bpm with average heart rate 95 bpm. Shortness of breath described in correlation with heart rates between 120 and 140 bpm. Few pauses of just over 2 seconds noted. Reported episodes of VT are actually lead artifact.     Dr. Percival Spanish did not think pt would be ablation candidate secondary to a fib in the past.   Pt's ASA and fishoil was stopped.    Echo 06/06/2015 - low normal LVEF 50-55% with mild PHTN 37 mHg, moderate LA dilatation, normal LV, no LVH and no valve dz.   On next visit with Dr. Wendi Snipes PCP his HR was 135- so lopressor increased to 50 mg BID after discussion with cardiologist.  This had controlled HR on next visit on the 20th.  Since admit pt has had 8 sec pause - I could only find 5 sec pause.  But several episodes.  Pt states  that this improved about 8 pm last evening.    EKG A flutter with rate control at 98 on admit with septal infarct. No acute changes from 06/01/15.   Past Medical History  Diagnosis Date  . Coronary atherosclerosis of native coronary artery   . Pure hypercholesterolemia   . Other and unspecified hyperlipidemia   . Hypertension   . Gout   . Angina   . Atrial flutter ([place])   . Chronic kidney disease     hx of BPH  . Arthritis   . Myocardial infarction [facility])     Past Surgical History  Procedure Laterality Date  . Cataracts    . Coronary artery bypass graft  11/20/2011    Procedure: CORONARY ARTERY BYPASS GRAFTING (CABG);  Surgeon: Ivin Poot, MD;  Location: [place];  Service: Open Heart Surgery;  Laterality: N/A;  Times 3. On Pump. Using endoscopically harvested right greater saphenous vein and left internal mammary artery.   . Maze  11/20/2011    Procedure: MAZE;  Surgeon: Ivin Poot, MD;  Location: [place];  Service: Open Heart Surgery;  Laterality: N/A;  . Chest tube insertion  11/22/2011    Procedure: CHEST TUBE INSERTION;  Surgeon: Tharon Aquas Trigt,  MD;  Location: [place];  Service: Thoracic;  Laterality: Right;  . Left and right heart catheterization with coronary angiogram N/A 11/16/2011    Procedure: LEFT AND RIGHT HEART CATHETERIZATION WITH CORONARY ANGIOGRAM;  Surgeon: Sherren Mocha, MD;  Location[facility] C[facility] LAB;  Service: Cardiovascular;  Laterality: N/A;    Family History  Problem Relation Age of Onset  . Colon cancer Son    Social History:  reports that he has never smoked. He has never used smokeless tobacco. He reports that he drinks alcohol. He reports that he does not use illicit drugs.  Allergies: No Known Allergies   OUTPATIENT MEDICATIONS: No current facility-administered medications on file prior to encounter.   Current Outpatient Prescriptions on File Prior to Encounter  Medication Sig Dispense Refill  . allopurinol (ZYLOPRIM) 300 MG tablet Take 300 mg by  mouth daily.      Marland Kitchen amLODipine (NORVASC) 10 MG tablet Take 10 mg by mouth daily.     Marland Kitchen atorvastatin (LIPITOR) 80 MG tablet TAKE ONE TABLET BY MOUTH ONE TIME DAILY 30 tablet 9  . furosemide (LASIX) 40 MG tablet TAKE ONE TABLET BY MOUTH ONE TIME DAILY (Patient taking differently: TAKE ONE TABLET BY MOUTH ONE TIME DAILY AFTER SUPPER) 30 tablet 0  . metoprolol tartrate (LOPRESSOR) 50 MG tablet Take 1 tablet (50 mg total) by mouth 2 (two) times daily. 60 tablet 3  . potassium chloride SA (K-DUR,KLOR-CON) 20 MEQ tablet TAKE ONE TABLET BY MOUTH TWICE DAILY 60 tablet 9  . rivaroxaban (XARELTO) 20 MG TABS tablet Take 1 tablet (20 mg total) by mouth daily with supper. (Patient taking differently: Take 20 mg by mouth daily after supper. ) 30 tablet 11     CURRENT MEDICATIONS: Scheduled Meds: . allopurinol  300 mg Oral Daily  . amLODipine  10 mg Oral Daily  . atorvastatin  80 mg Oral Daily  . doxazosin  2 mg Oral QPC supper  . furosemide  40 mg Oral Daily  . rivaroxaban  20 mg Oral QPC supper   Continuous Infusions:  PRN Meds:.acetaminophen **OR** acetaminophen, ondansetron **OR** ondansetron (ZOFRAN) IV, polyethylene glycol, zolpidem   Results for orders placed or performed during the hospital encounter of 06/25/15 (from the past 48 hour(s))  CBC with Differential/Platelet     Status: Abnormal   Collection Time: 06/25/15  7:32 PM  Result Value Ref Range   WBC 5.1 4.0 - 10.5 K/uL   RBC 4.29 4.22 - 5.81 MIL/uL   Hemoglobin 12.9 (L) 13.0 - 17.0 g/dL   HCT 38.8 (L) 39.0 - 52.0 %   MCV 90.4 78.0 - 100.0 fL   MCH 30.1 26.0 - 34.0 pg   MCHC 33.2 30.0 - 36.0 g/dL   RDW 13.3 11.5 - 15.5 %   Platelets 160 150 - 400 K/uL   Neutrophils Relative % 63 %   Neutro Abs 3.2 1.7 - 7.7 K/uL   Lymphocytes Relative 25 %   Lymphs Abs 1.3 0.7 - 4.0 K/uL   Monocytes Relative 9 %   Monocytes Absolute 0.4 0.1 - 1.0 K/uL   Eosinophils Relative 3 %   Eosinophils Absolute 0.1 0.0 - 0.7 K/uL   Basophils Relative 0  %   Basophils Absolute 0.0 0.0 - 0.1 K/uL  Comprehensive metabolic panel     Status: Abnormal   Collection Time: 06/25/15  7:32 PM  Result Value Ref Range   Sodium 135 135 - 145 mmol/L   Potassium 3.3 (L) 3.5 - 5.1 mmol/L  Chloride 99 (L) 101 - 111 mmol/L   CO2 26 22 - 32 mmol/L   Glucose, Bld 114 (H) 65 - 99 mg/dL   BUN 11 6 - 20 mg/dL   Creatinine, Ser 1.01 0.61 - 1.24 mg/dL   Calcium 8.6 (L) 8.9 - 10.3 mg/dL   Total Protein 6.3 (L) 6.5 - 8.1 g/dL   Albumin 3.2 (L) 3.5 - 5.0 g/dL   AST 18 15 - 41 U/L   ALT 10 (L) 17 - 63 U/L   Alkaline Phosphatase 87 38 - 126 U/L   Total Bilirubin 1.0 0.3 - 1.2 mg/dL   GFR calc non Af Amer >60 >60 mL/min   GFR calc Af Amer >60 >60 mL/min    Comment: (NOTE) The eGFR has been calculated using the CKD EPI equation. This calculation has not been validated in all clinical situations. eGFR's persistently <60 mL/min signify possible Chronic Kidney Disease.    Anion gap 10 5 - 15  Magnesium     Status: None   Collection Time: 06/25/15  7:32 PM  Result Value Ref Range   Magnesium 1.8 1.7 - 2.4 mg/dL  Phosphorus     Status: Abnormal   Collection Time: 06/25/15  7:32 PM  Result Value Ref Range   Phosphorus 1.5 (L) 2.5 - 4.6 mg/dL  TSH     Status: None   Collection Time: 06/25/15  7:32 PM  Result Value Ref Range   TSH 3.886 0.350 - 4.500 uIU/mL  Troponin I     Status: None   Collection Time: 06/25/15  7:32 PM  Result Value Ref Range   Troponin I <0.03 <0.031 ng/mL    Comment:        NO INDICATION OF MYOCARDIAL INJURY.   TSH     Status: None   Collection Time: 06/26/15 12:15 AM  Result Value Ref Range   TSH 2.475 0.350 - 4.500 uIU/mL  Brain natriuretic peptide     Status: None   Collection Time: 06/26/15 12:18 AM  Result Value Ref Range   B Natriuretic Peptide 47.9 0.0 - 100.0 pg/mL  Troponin I     Status: None   Collection Time: 06/26/15 12:18 AM  Result Value Ref Range   Troponin I <0.03 <0.031 ng/mL    Comment:        NO  INDICATION OF MYOCARDIAL INJURY.   Basic metabolic panel     Status: Abnormal   Collection Time: 06/26/15  4:49 AM  Result Value Ref Range   Sodium 135 135 - 145 mmol/L   Potassium 3.9 3.5 - 5.1 mmol/L   Chloride 100 (L) 101 - 111 mmol/L   CO2 28 22 - 32 mmol/L   Glucose, Bld 101 (H) 65 - 99 mg/dL   BUN 9 6 - 20 mg/dL   Creatinine, Ser 1.02 0.61 - 1.24 mg/dL   Calcium 8.4 (L) 8.9 - 10.3 mg/dL   GFR calc non Af Amer >60 >60 mL/min   GFR calc Af Amer >60 >60 mL/min    Comment: (NOTE) The eGFR has been calculated using the CKD EPI equation. This calculation has not been validated in all clinical situations. eGFR's persistently <60 mL/min signify possible Chronic Kidney Disease.    Anion gap 7 5 - 15  CBC     Status: Abnormal   Collection Time: 06/26/15  4:49 AM  Result Value Ref Range   WBC 5.3 4.0 - 10.5 K/uL   RBC 4.17 (L) 4.22 - 5.81 MIL/uL  Hemoglobin 12.4 (L) 13.0 - 17.0 g/dL   HCT 38.2 (L) 39.0 - 52.0 %   MCV 91.6 78.0 - 100.0 fL   MCH 29.7 26.0 - 34.0 pg   MCHC 32.5 30.0 - 36.0 g/dL   RDW 13.1 11.5 - 15.5 %   Platelets 160 150 - 400 K/uL  Basic metabolic panel     Status: Abnormal   Collection Time: 06/27/15  3:53 AM  Result Value Ref Range   Sodium 140 135 - 145 mmol/L   Potassium 3.5 3.5 - 5.1 mmol/L   Chloride 106 101 - 111 mmol/L   CO2 29 22 - 32 mmol/L   Glucose, Bld 97 65 - 99 mg/dL   BUN 9 6 - 20 mg/dL   Creatinine, Ser 1.06 0.61 - 1.24 mg/dL   Calcium 8.7 (L) 8.9 - 10.3 mg/dL   GFR calc non Af Amer >60 >60 mL/min   GFR calc Af Amer >60 >60 mL/min    Comment: (NOTE) The eGFR has been calculated using the CKD EPI equation. This calculation has not been validated in all clinical situations. eGFR's persistently <60 mL/min signify possible Chronic Kidney Disease.    Anion gap 5 5 - 15  CBC     Status: Abnormal   Collection Time: 06/27/15  3:53 AM  Result Value Ref Range   WBC 5.5 4.0 - 10.5 K/uL   RBC 4.18 (L) 4.22 - 5.81 MIL/uL   Hemoglobin 12.4  (L) 13.0 - 17.0 g/dL   HCT 38.3 (L) 39.0 - 52.0 %   MCV 91.6 78.0 - 100.0 fL   MCH 29.7 26.0 - 34.0 pg   MCHC 32.4 30.0 - 36.0 g/dL   RDW 13.1 11.5 - 15.5 %   Platelets 154 150 - 400 K/uL  Glucose, capillary     Status: None   Collection Time: 06/27/15  7:37 AM  Result Value Ref Range   Glucose-Capillary 96 65 - 99 mg/dL   No results found.  JAS:NKNLZJQ:BH colds or fevers, no weight changes Skin:no rashes or ulcers HEENT:no blurred vision, no congestion CV:see HPI PUL:see HPI GI:no diarrhea constipation or melena, no indigestion GU:no hematuria, no dysuria MS:no joint pain, no claudication Neuro:+ syncope, + lightheadedness Endo:no diabetes, no thyroid disease    Blood pressure 135/78, pulse 94, temperature 98.4 F (36.9 C), temperature source Oral, resp. rate 17, height 5' 11" (1.803 m), weight 279 lb 1.6 oz (126.6 kg), SpO2 94 %.  Wt Readings from Last 3 Encounters:  06/27/15 279 lb 1.6 oz (126.6 kg)  06/16/15 276 lb 9.6 oz (125.465 kg)  06/06/15 278 lb 9.6 oz (126.372 kg)    PE: General:Pleasant affect, NAD Skin:Warm and dry, brisk capillary refill HEENT:normocephalic, sclera clear, mucus membranes moist Neck:supple, no JVD, no bruits  Heart:irreg irreg without murmur, gallup, rub or click Lungs:clear without rales, rhonchi, or wheezes ALP:FXTKW, soft, non tender, + BS, do not palpate liver spleen or masses Ext:tr lower ext edema, 2+ pedal pulses, 2+ radial pulses Neuro:alert and oriented X 3, MAE, follows commands, + facial symmetry    Assessment/Plan Principal Problem:   Symptomatic bradycardia Active Problems:   Pure hypercholesterolemia   CORONARY ATHEROSCLEROSIS NATIVE CORONARY ARTERY   HTN (hypertension)   Dizziness   Status post coronary artery bypass grafting   S/P Maze operation for atrial fibrillation   Atrial flutter (HCC)   AV block   AV heart block  Syncope/near syncope-  Metoprolol on hold - Dr. Caryl Comes to see neg. Troponin.  I  discussed  possible pacemaker with pt, and then per dr. Caryl Comes and Dr. Jerilynn Mages. Croitoru we will plan for DCCV this afternoon.  I have discussed with pt.  Dr. Caryl Comes to see ? Resume BB.  --  A flutter- ? Ablation though pt has hx of a fib.    Anticoagulation on Xarelto .it was started on 06/17/15 and pt has not missed any doses.    [place] Practitioner Certified [place] Pager [phone] or after 5pm or weekends call [phone] 06/27/2015, 10:12 AM  Discussed with Dr Thermon Leyland   Two options present themselves, the first is to cardiovert and avoid the use of rate controlling meds which was associated with the brady events and let the frequency of recurring events determine the course of action, meds and pacing or infrequent cardioversions.  The second is to proceed with pacing and cardioversion.    Since it has been > 3 yrs since surgery, we have elected to pursue the first option for now and have discussed this with his family;  A;; were agreeable   The pts atrial flutter is atypical and related to scarring in the context of his MAZE   Anticoagulation is indicated and ablation not the right initial therapy

## 2015-06-27 NOTE — Discharge Summary (Addendum)
CARDIOLOGY DISCHARGE SUMMARY   Patient ID: TEJA JUDICE MRN: [id number] DOB/AGE: Nov 11, 1932 81 y.o.  Admit date: 06/25/2015 Discharge date: 06/27/2015  PCP: Kenn File, MD Primary Cardiologist: Dr. Percival Spanish  Primary Discharge Diagnosis:Symptomatic bradycardia, Atrial Flutter Secondary Discharge Diagnosis: Pure hypercholesterolemia, CORONARY ATHEROSCLEROSIS NATIVE CORONARY ARTERY, HTN (hypertension), Dizziness, Status post coronary artery bypass grafting, S/P Maze operation for atrial fibrillation, Atrial flutter, AV heart block  Consults: Electrophysiology   Procedures: Direct Current Cardioversion  Hospital Course: Marcus [name] is a 81 y.o. male with past medical history of CAD (s/p CABG), HTN, HLD, BPH, atrial fibrillation (s/p MAZE procedure 2013) and recent diagnosis of atrial flutter while at his routine office visit with Dr. Percival Spanish on 06/01/2015 at which time Xarelto was started.   He presented to [facility] ED on 06/25/2015 for intermittent episodes of lightheadedness which were not associated with chest pain, palpitations, headaches, or syncope. He was also noted to be having bradycardia at times. His Metoprolol dosage at time of admission was 50mg  BID.  Upon admission, his metoprolol was held. Overnight, he had no recurrence of bradycardia but HR was noted to be in the 150's at times. He was also noted to be having up to 8 second pauses on telemetry. It was thought he may benefit from ablation to help resolve the pauses.   On the morning of 06/27/2015 he was still in atrial flutter and having a HR of 95 at rest and up to the 150's with walking. EP was consulted to discuss potential management of his atrial flutter and pauses. They agreed with DCCV and said ablation would not be the best initial management for the patient.  Since he had been on anticoagulation for 3 weeks, it was decided to proceed with DCCV without TEE. The risks and benefits of the procedure were  discussed with the patient and he agreed to proceed.   He underwent DCCV on 06/27/2015 and had return to NSR following the procedure. There were no noted complications from the procedure. The patient was last examined by Dr. Sallyanne Kuster and deemed stable for discharge. He will be discharged on Metoprolol 25mg  BID. He will have close follow-up with the atrial fibrillation clinic which is scheduled for next week.    Labs:   Lab Results  Component Value Date   WBC 5.5 06/27/2015   HGB 12.4* 06/27/2015   HCT 38.3* 06/27/2015   MCV 91.6 06/27/2015   PLT 154 06/27/2015    Recent Labs Lab 06/25/15 1932  06/27/15 0353  NA 135  < > 140  K 3.3*  < > 3.5  CL 99*  < > 106  CO2 26  < > 29  BUN 11  < > 9  CREATININE 1.01  < > 1.06  CALCIUM 8.6*  < > 8.7*  PROT 6.3*  --   --   BILITOT 1.0  --   --   ALKPHOS 87  --   --   ALT 10*  --   --   AST 18  --   --   GLUCOSE 114*  < > 97  < > = values in this interval not displayed.  Recent Labs  06/25/15 1932 06/26/15 0018  TROPONINI <0.03 <0.03   Lipid Panel     Component Value Date/Time   CHOL 125 05/17/2014 0941   TRIG 150* 05/17/2014 0941   HDL 33* 05/17/2014 0941   CHOLHDL 3.8 05/17/2014 0941   VLDL 30 05/17/2014 0941   LDLCALC  62 05/17/2014 0941    B NATRIURETIC PEPTIDE  Date/Time Value Ref Range Status  06/26/2015 12:18 AM 47.9 0.0 - 100.0 pg/mL Final    Direct Current Cardioversion: 06/27/2015 Preop Dx: aTrial flutter atypical Post op DX: NSR   Procedure:  DC Cardioversion  Pt was sedated by anesthesia receiving 6 mg Versed and 100 mcg fentanyl  A synchronized shock 100 joules restored sinus Rhythm   Pt tolerated without difficulty.   EKG: Atrial Flutter with rate in 90's.   FOLLOW UP PLANS AND APPOINTMENTS No Known Allergies   Medication List    TAKE these medications        allopurinol 300 MG tablet  Commonly known as:  ZYLOPRIM  Take 300 mg by mouth daily.     amLODipine 10 MG tablet  Commonly  known as:  NORVASC  Take 10 mg by mouth daily.     atorvastatin 80 MG tablet  Commonly known as:  LIPITOR  TAKE ONE TABLET BY MOUTH ONE TIME DAILY     doxazosin 4 MG tablet  Commonly known as:  CARDURA  Take 2 mg by mouth daily after supper.     ferrous gluconate 324 MG tablet  Commonly known as:  FERGON  Take 324 mg by mouth 2 (two) times daily with a meal.     furosemide 40 MG tablet  Commonly known as:  LASIX  TAKE ONE TABLET BY MOUTH ONE TIME DAILY     metoprolol tartrate 25 MG tablet  Commonly known as:  LOPRESSOR  Take 1 tablet (25 mg total) by mouth 2 (two) times daily.     potassium chloride SA 20 MEQ tablet  Commonly known as:  K-DUR,KLOR-CON  TAKE ONE TABLET BY MOUTH TWICE DAILY     rivaroxaban 20 MG Tabs tablet  Commonly known as:  XARELTO  Take 1 tablet (20 mg total) by mouth daily with supper.         Follow-up Information    Follow up with CARROLL,DONNA, NP On 07/05/2015.   Specialties:  Nurse Practitioner, Cardiology   Why:  Cardiology Follow-Up for Atrial Flutter at the [facility] on 07/05/2015 at 10:00AM.   Contact information:   [place] [address] [phone]      BRING ALL MEDICATIONS WITH YOU TO FOLLOW UP APPOINTMENTS  Time spent with patient to include physician time: 40 minutes Signed: Erma Heritage, PA 06/27/2015, 3:26 PM Co-Sign MD

## 2015-06-27 NOTE — Progress Notes (Signed)
Post cardioversion 12 lead EKG done per order

## 2015-06-28 DIAGNOSIS — E7800 Pure hypercholesterolemia, unspecified: Secondary | ICD-10-CM

## 2015-06-28 LAB — BASIC METABOLIC PANEL
Anion gap: 9 (ref 5–15)
BUN: 13 mg/dL (ref 6–20)
CO2: 29 mmol/L (ref 22–32)
Calcium: 8.5 mg/dL — ABNORMAL LOW (ref 8.9–10.3)
Chloride: 97 mmol/L — ABNORMAL LOW (ref 101–111)
Creatinine, Ser: 1.06 mg/dL (ref 0.61–1.24)
GFR calc Af Amer: >60 mL/min (ref >60)
GFR calc non Af Amer: >60 mL/min (ref >60)
GLUCOSE: 93 mg/dL (ref 65–99)
Potassium: 3.4 mmol/L — ABNORMAL LOW (ref 3.5–5.1)
Sodium: 135 mmol/L (ref 135–145)

## 2015-06-28 LAB — CBC
HEMATOCRIT: 37.5 % — AB (ref 39.0–52.0)
Hemoglobin: 12.1 g/dL — ABNORMAL LOW (ref 13.0–17.0)
MCH: 29.7 pg (ref 26.0–34.0)
MCHC: 32.3 g/dL (ref 30.0–36.0)
MCV: 91.9 fL (ref 78.0–100.0)
Platelets: 151 10*3/uL (ref 150–400)
RBC: 4.08 MIL/uL — ABNORMAL LOW (ref 4.22–5.81)
RDW: 13.3 % (ref 11.5–15.5)
WBC: 5.7 10*3/uL (ref 4.0–10.5)

## 2015-06-28 LAB — GLUCOSE, CAPILLARY: Glucose-Capillary: 88 mg/dL (ref 65–99)

## 2015-06-28 MED ORDER — POTASSIUM CHLORIDE CRYS ER 20 MEQ PO TBCR
40.0000 meq | EXTENDED_RELEASE_TABLET | Freq: Once | ORAL | Status: AC
  Administered 2015-06-28: 40 meq via ORAL
  Filled 2015-06-28: qty 2

## 2015-06-28 NOTE — Progress Notes (Signed)
Patient Name: Marcus [name] Date of Encounter: 06/28/2015   SUBJECTIVE  Plan was to discharge, but preferred overnight monitoring of rhythm after DCCV. Feeling well. No chest pain, sob or palpitations.   CURRENT MEDS . allopurinol  300 mg Oral Daily  . amLODipine  10 mg Oral Daily  . atorvastatin  80 mg Oral Daily  . doxazosin  2 mg Oral QPC supper  . furosemide  40 mg Oral Daily  . rivaroxaban  20 mg Oral QPC supper  . sodium chloride  3 mL Intravenous Q12H    OBJECTIVE  Filed Vitals:   06/27/15 1449 06/27/15 1709 06/27/15 2037 06/28/15 0529  BP: 120/65 126/64 139/65 117/64  Pulse: 79  75 68  Temp:   98.1 F (36.7 C) 98.7 F (37.1 C)  TempSrc:   Oral Oral  Resp:   18 18  Height:      Weight:    267 lb 9.6 oz (121.383 kg)  SpO2:   95% 95%    Intake/Output Summary (Last 24 hours) at 06/28/15 0745 Last data filed at 06/27/15 2042  Gross per 24 hour  Intake    480 ml  Output   1500 ml  Net  -1020 ml   Filed Weights   06/26/15 0500 06/27/15 0423 06/28/15 0529  Weight: 271 lb 3.2 oz (123.016 kg) 279 lb 1.6 oz (126.6 kg) 267 lb 9.6 oz (121.383 kg)    PHYSICAL EXAM  General: Pleasant, NAD. Neuro: Alert and oriented X 3. Moves all extremities spontaneously. Psych: Normal affect. HEENT:  Normal  Neck: Supple without bruits or JVD. Lungs:  Resp regular and unlabored, CTA. Heart: RRR no s3, s4, or murmurs. Abdomen: Soft, non-tender, non-distended, BS + x 4.  Extremities: No clubbing, cyanosis or edema. DP/PT/Radials 2+ and equal bilaterally.  Accessory Clinical Findings  CBC  Recent Labs  06/25/15 1932  06/27/15 0353 06/28/15 0352  WBC 5.1  < > 5.5 5.7  NEUTROABS 3.2  --   --   --   HGB 12.9*  < > 12.4* 12.1*  HCT 38.8*  < > 38.3* 37.5*  MCV 90.4  < > 91.6 91.9  PLT 160  < > 154 151  < > = values in this interval not displayed. Basic Metabolic Panel  Recent Labs  06/25/15 1932  06/27/15 0353 06/28/15 0352  NA 135  < > 140 135  K 3.3*  < > 3.5  3.4*  CL 99*  < > 106 97*  CO2 26  < > 29 29  GLUCOSE 114*  < > 97 93  BUN 11  < > 9 13  CREATININE 1.01  < > 1.06 1.06  CALCIUM 8.6*  < > 8.7* 8.5*  MG 1.8  --   --   --   PHOS 1.5*  --   --   --   < > = values in this interval not displayed. Liver Function Tests  Recent Labs  06/25/15 1932  AST 18  ALT 10*  ALKPHOS 87  BILITOT 1.0  PROT 6.3*  ALBUMIN 3.2*   No results for input(s): LIPASE, AMYLASE in the last 72 hours. Cardiac Enzymes  Recent Labs  06/25/15 1932 06/26/15 0018  TROPONINI <0.03 <0.03    No results for input(s): CHOL, HDL, LDLCALC, TRIG, CHOLHDL, LDLDIRECT in the last 72 hours. Thyroid Function Tests  Recent Labs  06/26/15 0015  TSH 2.475    TELE  NSR  Radiology/Studies  No results found.  ASSESSMENT  AND PLAN Principal Problem:   Symptomatic bradycardia Active Problems:   Pure hypercholesterolemia   CORONARY ATHEROSCLEROSIS NATIVE CORONARY ARTERY   HTN (hypertension)   Dizziness   Status post coronary artery bypass grafting   S/P Maze operation for atrial fibrillation   Atrial flutter (HCC)   AV block   AV heart block    Plan: S/p DCCV 06/27/15. Maintaining sinus rhythm. K of 3.4. Will give supplement. No change to discharge summary. D/C this morning.   Signed, Bhagat,Bhavinkumar PA-C Pager [phone]   I have seen and examined the patient along with Bhagat,Bhavinkumar PA-C.  I have reviewed the chart, notes and new data.  I agree with PA's note.  Successfully maintained NSR overnight.  PLAN: Discussed mandatory uninterrupted anticoagulation for the next 30 days due to increased risk of stroke, as well as need to continue anticoagulation lifelong with only brief interruptions if needed for surgical interventions, bleeding complications etc. follow-up with Dr. Percival Spanish, [facility] office.  Sanda Klein, MD, [place] [phone] 06/28/2015, 8:14 AM

## 2015-06-28 NOTE — Care Management Important Message (Signed)
Important Message  Patient Details  Name: Marcus [name] MRN: [id number] Date of Birth: [birth year]   Medicare Important Message Given:  Yes-second notification given    Nathen May 06/28/2015, 10:14 AM

## 2015-06-28 NOTE — Discharge Instructions (Addendum)
Atrial Flutter °Atrial flutter is a heart rhythm that can cause the heart to beat very fast (tachycardia). It originates in the upper chambers of the heart (atria). In atrial flutter, the top chambers of the heart (atria) often beat much faster than the bottom chambers of the heart (ventricles). Atrial flutter has a regular "saw toothed" appearance in an EKG readout. An EKG is a test that records the electrical activity of the heart. Atrial flutter can cause the heart to beat up to 150 beats per minute (BPM). Atrial flutter can either be short lived (paroxysmal) or permanent.  °CAUSES  °Causes of atrial flutter can be many. Some of these include: °· Heart related issues: °¨ Heart attack (myocardial infarction). °¨ Heart failure. °¨ Heart valve problems. °¨ Poorly controlled high blood pressure (hypertension). °¨ After open heart surgery. °· Lung related issues: °¨ A blood clot in the lungs (pulmonary embolism). °¨ Chronic obstructive pulmonary disease (COPD). Medications used to treat COPD can attribute to atrial flutter. °· Other related causes: °¨ Hyperthyroidism. °¨ Caffeine. °¨ Some decongestant cold medications. °¨ Low electrolyte levels such as potassium or magnesium. °¨ Cocaine. °SYMPTOMS °· An awareness of your heart beating rapidly (palpitations). °· Shortness of breath. °· Chest pain. °· Low blood pressure (hypotension). °· Dizziness or fainting. °DIAGNOSIS  °Different tests can be performed to diagnose atrial flutter.  °· An EKG. °· Holter monitor. This is a 24-hour recording of your heart rhythm. You will also be given a diary. Write down all symptoms that you have and what you were doing at the time you experienced symptoms. °· Cardiac event monitor. This Pardo device can be worn for up to 30 days. When you have heart symptoms, you will push a button on the device. This will then record your heart rhythm. °· Echocardiogram. This is an imaging test to look at your heart. Your caregiver will look at your  heart valves and the ventricles. °· Stress test. This test can help determine if the atrial flutter is related to exercise or if coronary artery disease is present. °· Laboratory studies will look at certain blood levels like: °¨ Complete blood count (CBC). °¨ Potassium. °¨ Magnesium. °¨ Thyroid function. °TREATMENT  °Treatment of atrial flutter varies. A combination of therapies may be used or sometimes atrial flutter may need only 1 type of treatment.  °Lab work: °If your blood work, such as your electrolytes (potassium, magnesium) or your thyroid function tests, are abnormal, your caregiver will treat them accordingly.  °Medication:  °There are several different types of medications that can convert your heart to a normal rhythm and prevent atrial flutter from reoccurring.  °Nonsurgical procedures: °Nonsurgical techniques may be used to control atrial flutter. Some examples include: °· Cardioversion. This technique uses either drugs or an electrical shock to restore a normal heart rhythm: °¨ Cardioversion drugs may be given through an intravenous (IV) line to help "reset" the heart rhythm. °¨ In electrical cardioversion, your caregiver shocks your heart with electrical energy. This helps to reset the heartbeat to a normal rhythm. °· Ablation. If atrial flutter is a persistent problem, an ablation may be needed. This procedure is done under mild sedation. High frequency radio-wave energy is used to destroy the area of heart tissue responsible for atrial flutter. °SEEK IMMEDIATE MEDICAL CARE IF:  °You have: °· Dizziness. °· Near fainting or fainting. °· Shortness of breath. °· Chest pain or pressure. °· Sudden nausea or vomiting. °· Profuse sweating. °If you have the above symptoms,   call your local emergency service immediately! Do not drive yourself to the hospital. MAKE SURE YOU:   Understand these instructions.  Will watch your condition.  Will get help right away if you are not doing well or get worse.    This information is not intended to replace advice given to you by your health care provider. Make sure you discuss any questions you have with your health care provider.   Document Released: 12/30/2008 Document Revised: 09/03/2014 Document Reviewed: 02/25/2015 Elsevier Interactive Patient Education 2016 [place] on my medicine - XARELTO (Rivaroxaban)  This medication education was reviewed with me or my healthcare representative as part of my discharge preparation.  The pharmacist that spoke with me during my hospital stay was:  Romona Curls, [facility]  Why was Xarelto prescribed for you? Xarelto was prescribed for you to reduce the risk of a blood clot forming that can cause a stroke if you have a medical condition called atrial fibrillation (a type of irregular heartbeat).  What do you need to know about xarelto ? Take your Xarelto ONCE DAILY at the same time every day with your evening meal. If you have difficulty swallowing the tablet whole, you may crush it and mix in applesauce just prior to taking your dose.  Take Xarelto exactly as prescribed by your doctor and DO NOT stop taking Xarelto without talking to the doctor who prescribed the medication.  Stopping without other stroke prevention medication to take the place of Xarelto may increase your risk of developing a clot that causes a stroke.  Refill your prescription before you run out.  After discharge, you should have regular check-up appointments with your healthcare provider that is prescribing your Xarelto.  In the future your dose may need to be changed if your kidney function or weight changes by a significant amount.  What do you do if you miss a dose? If you are taking Xarelto ONCE DAILY and you miss a dose, take it as soon as you remember on the same day then continue your regularly scheduled once daily regimen the next day. Do not take two doses of Xarelto at the same time or on the same day.    Important Safety Information A possible side effect of Xarelto is bleeding. You should call your healthcare provider right away if you experience any of the following: ? Bleeding from an injury or your nose that does not stop. ? Unusual colored urine (red or dark brown) or unusual colored stools (red or black). ? Unusual bruising for unknown reasons. ? A serious fall or if you hit your head (even if there is no bleeding).  Some medicines may interact with Xarelto and might increase your risk of bleeding while on Xarelto. To help avoid this, consult your healthcare provider or pharmacist prior to using any new prescription or non-prescription medications, including herbals, vitamins, non-steroidal anti-inflammatory drugs (NSAIDs) and supplements.  This website has more information on Xarelto: [email or web address].[email or web address]

## 2015-06-29 ENCOUNTER — Telehealth: Payer: Self-pay | Admitting: Cardiology

## 2015-06-29 NOTE — Telephone Encounter (Signed)
Pt will need a post hosp f/u call:  He will be going to the Afib clinic to see Roderic Palau on 11/8 @ 10:00am   Thanks

## 2015-06-29 NOTE — Telephone Encounter (Signed)
Leave message for pt to call back 

## 2015-06-30 ENCOUNTER — Telehealth: Payer: Self-pay | Admitting: Family Medicine

## 2015-06-30 ENCOUNTER — Encounter: Payer: Self-pay | Admitting: Family Medicine

## 2015-06-30 ENCOUNTER — Ambulatory Visit (INDEPENDENT_AMBULATORY_CARE_PROVIDER_SITE_OTHER): Payer: Medicare Other | Admitting: Family Medicine

## 2015-06-30 VITALS — BP 145/77 | HR 58 | Temp 97.3°F | Ht 71.0 in | Wt 275.8 lb

## 2015-06-30 DIAGNOSIS — R319 Hematuria, unspecified: Secondary | ICD-10-CM | POA: Diagnosis not present

## 2015-06-30 DIAGNOSIS — N419 Inflammatory disease of prostate, unspecified: Secondary | ICD-10-CM | POA: Insufficient documentation

## 2015-06-30 DIAGNOSIS — N410 Acute prostatitis: Secondary | ICD-10-CM | POA: Diagnosis not present

## 2015-06-30 LAB — POCT URINALYSIS DIPSTICK
Bilirubin, UA: NEGATIVE
Glucose, UA: NEGATIVE
Ketones, UA: NEGATIVE
NITRITE UA: NEGATIVE
PH UA: 6
PROTEIN UA: NEGATIVE
SPEC GRAV UA: 1.015
UROBILINOGEN UA: 4

## 2015-06-30 LAB — POCT UA - MICROSCOPIC ONLY
Bacteria, U Microscopic: NEGATIVE
Casts, Ur, LPF, POC: NEGATIVE
Crystals, Ur, HPF, POC: NEGATIVE
Mucus, UA: NEGATIVE
YEAST UA: NEGATIVE

## 2015-06-30 MED ORDER — CIPROFLOXACIN HCL 500 MG PO TABS: 500.0000 mg | ORAL_TABLET | Freq: Two times a day (BID) | ORAL | Status: DC

## 2015-06-30 NOTE — Telephone Encounter (Signed)
Patient has had blood in his urine. Appointment given for today at 12:55pm today with Dr. Wendi Snipes.

## 2015-06-30 NOTE — Patient Instructions (Signed)
Great to see you!  Come back for a repeat urine culture in 7 days  Continue the antibiotics until you are finished or we call you and tell you that your urine is clear.   Please call Dr. Jeffie Pollock for a follow up in the next 2-3 weeks.     Prostatitis Prostatitis is redness, soreness, and puffiness (swelling) of the prostate gland. The prostate gland is the walnut-sized gland located just below your bladder. HOME CARE:   Take all medicines as told by your doctor.  Take warm-water baths (sitz baths) as told by your doctor. GET HELP IF:  Your symptoms get worse, not better.  You have a fever. GET HELP RIGHT AWAY IF:   You have chills.  You feel sick to your stomach (nauseous) or like you will throw up (vomit).  You feel lightheaded or like you will pass out (faint).  You are unable to pee (urinate).  You have blood or blood clumps (clots) in your pee (urine). MAKE SURE YOU:  Understand these instructions.  Will watch your condition.  Will get help right away if you are not doing well or get worse.   This information is not intended to replace advice given to you by your health care provider. Make sure you discuss any questions you have with your health care provider.   Document Released: 02/12/2012 Document Revised: 09/03/2014 Document Reviewed: 03/02/2013 Elsevier Interactive Patient Education [employer].

## 2015-06-30 NOTE — Progress Notes (Signed)
   HPI  Patient presents today for evaluation of hematuria and hospital follow-up.  He is admitted to the hospital for syncope, he weighstreatment with cardioversion and his metoprolol was decreased to 25 mg twice daily. He comes in today with hematuria and mild headache. He states that the hematuria started this morning without any pelvic pain. He's had a UTI once before. He has no problems urinating currently. He has a urologist, Dr. Jeffie Pollock He has ahd intermittent episodes ranging from dark urine (current) to grossly bloody urine.   He is on Xarelto.  PMH: Smoking status noted ROS: Per HPI  Objective: BP 145/77 mmHg  Pulse 58  Temp(Src) 97.3 F (36.3 C) (Oral)  Ht 5\' 11"  (1.803 m)  Wt 275 lb 12.8 oz (125.102 kg)  BMI 38.48 kg/m2 Gen: NAD, alert, cooperative with exam HEENT: NCAT CV: RRR, good S1/S2, no murmur Resp: CTABL, no wheezes, non-labored Abd: SNTND, BS present, no guarding or organomegaly, no suprapubic tenderness, no CVA tenderness Ext: No edema, warm Neuro: Alert and oriented, No gross deficits Rectal: Normal rectal tone, prostate tenderness with palpation  Urine color dark but not grossly bloody.   Assessment and plan:  # Acute prostatitis Treat with ciprofloxacin 500 twice daily for 21 days, repeat urine culture in 7 days and plan to stop antibiotics after that if his urine is negative at that time. Recommend close follow up with Dr. Jeffie Pollock and RTC if symptoms continue or worsen.  Extra caution with xarelto We discussed reasons to go to the ER including continued bleeding in the urine or continued bleeding from any site.    Orders Placed This Encounter  Procedures  . POCT urinalysis dipstick  . POCT UA - Microscopic Only    Meds ordered this encounter  Medications  . ciprofloxacin (CIPRO) 500 MG tablet    Sig: Take 1 tablet (500 mg total) by mouth 2 (two) times daily.    Dispense:  42 tablet    Refill:  0    Laroy Apple, MD [place]  [facility] 06/30/2015, 1:32 PM

## 2015-06-30 NOTE — Telephone Encounter (Signed)
Attempted to contact patient - phone rang - no answer

## 2015-07-02 LAB — URINE CULTURE

## 2015-07-04 NOTE — Telephone Encounter (Signed)
TOC call attempts x2.

## 2015-07-05 ENCOUNTER — Ambulatory Visit (HOSPITAL_COMMUNITY)
Admit: 2015-07-05 | Discharge: 2015-07-05 | Disposition: A | Discharge status: Home / Self Care | Payer: Medicare Other | Source: Ambulatory Visit | Attending: Nurse Practitioner | Admitting: Nurse Practitioner

## 2015-07-05 ENCOUNTER — Encounter (HOSPITAL_COMMUNITY): Payer: Self-pay | Admitting: Nurse Practitioner

## 2015-07-05 VITALS — BP 134/76 | HR 55 | Ht 71.0 in | Wt 275.4 lb

## 2015-07-05 DIAGNOSIS — I2510 Atherosclerotic heart disease of native coronary artery without angina pectoris: Secondary | ICD-10-CM | POA: Diagnosis not present

## 2015-07-05 DIAGNOSIS — I4892 Unspecified atrial flutter: Secondary | ICD-10-CM

## 2015-07-05 DIAGNOSIS — I10 Essential (primary) hypertension: Secondary | ICD-10-CM | POA: Diagnosis not present

## 2015-07-05 NOTE — Progress Notes (Signed)
Patient ID: Marcus [name], male   DOB: 1932-08-29, 81 y.o.   MRN: [id number]     Primary Care Physician: Kenn File, MD Referring Physician: [facility] f/u Cardiologist: Dr. Gilman Buttner is a 81 y.o. male with a h/o CAD (s/p CABG), HTN, HLD, BPH, atrial fibrillation (s/p MAZE procedure 2013) and recent diagnosis of asymptomatic atrial flutter while at his routine office visit with Dr. Percival Spanish on 06/01/2015 at which time Xarelto was started. He had noticed mild exertional dyspnea.  He presented to [facility] ED on 06/25/2015 for intermittent episodes of lightheadedness which were not associated with chest pain, palpitations, headaches, or syncope. He was also noted to be having bradycardia at times. His Metoprolol dosage at time of admission was 50mg  BID.  Upon admission, his metoprolol was held. Overnight, he had no recurrence of bradycardia but HR was noted to be in the 150's at times. He was also noted to be having up to 8 second pauses on telemetry. It was thought he may benefit from ablation to help resolve the pauses.   On the morning of 06/27/2015 he was still in atrial flutter and having a HR of 95 at rest and up to the 150's with walking. EP was consulted to discuss potential management of his atrial flutter and pauses. They agreed with DCCV and said ablation would not be the best initial management for the patient.  Since he had been on anticoagulation for 3 weeks, it was decided to proceed with DCCV without TEE. The risks and benefits of the procedure were discussed with the patient and he agreed to proceed.   He underwent DCCV on 06/27/2015 and had return to NSR following the procedure. There were no noted complications from the procedure. The patient was last examined by Dr. Sallyanne Kuster and deemed stable for discharge. He was  discharged on Metoprolol 25 bid.  He is here for f/u in the afib clinic and is staying in [place] as far as pt is aware. He denies any shortness of breath.  Has not noticed any spells of being lightheaded.He understands importance of taking blood thinner and states he has not missed any doses. Continues on metoprolol at 25 mg bid.  Today, he denies symptoms of palpitations, chest pain, shortness of breath, orthopnea, PND, lower extremity edema, dizziness, presyncope, syncope, or neurologic sequela. The patient is tolerating medications without difficulties and is otherwise without complaint today.   Past Medical History  Diagnosis Date  . Coronary atherosclerosis of native coronary artery   . Pure hypercholesterolemia   . Other and unspecified hyperlipidemia   . Hypertension   . Gout   . Angina   . Atrial flutter ([place])   . Chronic kidney disease     hx of BPH  . Arthritis   . Myocardial infarction [facility])    Past Surgical History  Procedure Laterality Date  . Cataracts    . Coronary artery bypass graft  11/20/2011    Procedure: CORONARY ARTERY BYPASS GRAFTING (CABG);  Surgeon: Ivin Poot, MD;  Location: [facility];  Service: Open Heart Surgery;  Laterality: N/A;  Times 3. On Pump. Using endoscopically harvested right greater saphenous vein and left internal mammary artery.   . Maze  11/20/2011    Procedure: MAZE;  Surgeon: Ivin Poot, MD;  Location: [place];  Service: Open Heart Surgery;  Laterality: N/A;  . Chest tube insertion  11/22/2011    Procedure: CHEST TUBE INSERTION;  Surgeon: Ivin Poot, MD;  Location: [facility] OR;  Service: Thoracic;  Laterality: Right;  . Left and right heart catheterization with coronary angiogram N/A 11/16/2011    Procedure: LEFT AND RIGHT HEART CATHETERIZATION WITH CORONARY ANGIOGRAM;  Surgeon: Sherren Mocha, MD;  Location: [facility] CATH LAB;  Service: Cardiovascular;  Laterality: N/A;  . Electrophysiologic study N/A 06/27/2015    Procedure: Cardioversion;  Surgeon: Deboraha Sprang, MD;  Location: [place] CV LAB;  Service: Cardiovascular;  Laterality: N/A;    Current Outpatient Prescriptions  Medication Sig  Dispense Refill  . allopurinol (ZYLOPRIM) 300 MG tablet Take 300 mg by mouth daily.      Marland Kitchen amLODipine (NORVASC) 10 MG tablet Take 10 mg by mouth daily.     Marland Kitchen atorvastatin (LIPITOR) 80 MG tablet TAKE ONE TABLET BY MOUTH ONE TIME DAILY 30 tablet 9  . ciprofloxacin (CIPRO) 500 MG tablet Take 1 tablet (500 mg total) by mouth 2 (two) times daily. 42 tablet 0  . doxazosin (CARDURA) 4 MG tablet Take 2 mg by mouth daily after supper.    . ferrous gluconate (FERGON) 324 MG tablet Take 324 mg by mouth 2 (two) times daily with a meal.    . furosemide (LASIX) 40 MG tablet TAKE ONE TABLET BY MOUTH ONE TIME DAILY (Patient taking differently: TAKE ONE TABLET BY MOUTH ONE TIME DAILY AFTER SUPPER) 30 tablet 0  . metoprolol (LOPRESSOR) 25 MG tablet Take 1 tablet (25 mg total) by mouth 2 (two) times daily. 60 tablet 6  . potassium chloride SA (K-DUR,KLOR-CON) 20 MEQ tablet TAKE ONE TABLET BY MOUTH TWICE DAILY 60 tablet 9  . rivaroxaban (XARELTO) 20 MG TABS tablet Take 1 tablet (20 mg total) by mouth daily with supper. (Patient taking differently: Take 20 mg by mouth daily after supper. ) 30 tablet 11   No current facility-administered medications for this encounter.    No Known Allergies  Social History   Social History  . Marital Status: Married    Spouse Name: [name]  . Number of Children: N/A  . Years of Education: N/A   Occupational History  . works with "heavy equipment"    Social History Main Topics  . Smoking status: Never Smoker   . Smokeless tobacco: Never Used  . Alcohol Use: Yes     Comment: Occasional   . Drug Use: No  . Sexual Activity: No   Other Topics Concern  . Not on file   Social History Narrative    Family History  Problem Relation Age of Onset  . Colon cancer Son     ROS- All systems are reviewed and negative except as per the HPI above  Physical Exam: Filed Vitals:   07/05/15 1003  BP: 134/76  Pulse: 55  Height: 5\' 11"  (1.803 m)  Weight: 275 lb 6.4 oz (124.921  kg)    GEN- The patient is well appearing, alert and oriented x 3 today.   Head- normocephalic, atraumatic Eyes-  Sclera clear, conjunctiva pink Ears- hearing intact Oropharynx- clear Neck- supple, no JVP Lymph- no cervical lymphadenopathy Lungs- Clear to ausculation bilaterally, normal work of breathing Heart- Regular rate and rhythm, no murmurs, rubs or gallops, PMI not laterally displaced GI- soft, NT, ND, + BS Extremities- no clubbing, cyanosis, or edema MS- no significant deformity or atrophy Skin- no rash or lesion Psych- euthymic mood, full affect Neuro- strength and sensation are intact  EKG-Sinus brady at 55 bpm, pr int 194 ms, qrs int 92 ms, qtc 413 ms. LAD, cannto r/o anteroseptal  infarct, age undetermined. Epic records reviewed.  Assessment and Plan: 1. Aflutter S/p maze procedure for afib 2013 Successful DCCV Continue metoprolol 25 mg bid Continue xarelto 20mg  qd for chadsvasc score of at least 4.  2. CAD Stable  3. Htn well managed  F/u in afib clinc x one month, sooner if needed

## 2015-07-18 ENCOUNTER — Other Ambulatory Visit: Payer: Self-pay | Admitting: Cardiology

## 2015-07-25 ENCOUNTER — Telehealth: Payer: Self-pay | Admitting: Family Medicine

## 2015-08-04 ENCOUNTER — Encounter (HOSPITAL_COMMUNITY): Payer: Self-pay | Admitting: Nurse Practitioner

## 2015-08-04 ENCOUNTER — Ambulatory Visit (HOSPITAL_COMMUNITY)
Admission: RE | Admit: 2015-08-04 | Discharge: 2015-08-04 | Disposition: A | Discharge status: Home / Self Care | Payer: Medicare Other | Source: Ambulatory Visit | Attending: Nurse Practitioner | Admitting: Nurse Practitioner

## 2015-08-04 VITALS — BP 126/68 | HR 62 | Ht 71.0 in | Wt 273.0 lb

## 2015-08-04 DIAGNOSIS — I10 Essential (primary) hypertension: Secondary | ICD-10-CM | POA: Diagnosis not present

## 2015-08-04 DIAGNOSIS — I2510 Atherosclerotic heart disease of native coronary artery without angina pectoris: Secondary | ICD-10-CM | POA: Diagnosis not present

## 2015-08-04 DIAGNOSIS — I4892 Unspecified atrial flutter: Secondary | ICD-10-CM | POA: Diagnosis present

## 2015-08-04 NOTE — Progress Notes (Signed)
Patient ID: JUVENS MATTON, male   DOB: [birth year], 81 y.o.   MRN: [id number] Patient ID: AMEIR GAARDER, male   DOB: [birth year], 81 y.o.   MRN: [id number]     Primary Care Physician: Kenn File, MD Referring Physician: [facility] f/u Cardiologist: Dr. Gilman Buttner is a 81 y.o. male with a h/o CAD (s/p CABG), HTN, HLD, BPH, atrial fibrillation (s/p MAZE procedure 2013) and recent diagnosis of asymptomatic atrial flutter while at his routine office visit with Dr. Percival Spanish on 06/01/2015 at which time Xarelto was started. He had noticed mild exertional dyspnea.  He presented to [facility] ED on 06/25/2015 for intermittent episodes of lightheadedness which were not associated with chest pain, palpitations, headaches, or syncope. He was also noted to be having bradycardia at times. His Metoprolol dosage at time of admission was 50mg  BID.  Upon admission, his metoprolol was held. Overnight, he had no recurrence of bradycardia but HR was noted to be in the 150's at times. He was also noted to be having up to 8 second pauses on telemetry. It was thought he may benefit from ablation to help resolve the pauses.   On the morning of 06/27/2015 he was still in atrial flutter and having a HR of 95 at rest and up to the 150's with walking. EP was consulted to discuss potential management of his atrial flutter and pauses. Agreed with DCCV and said ablation would not be the best initial management for the patient.  Since he had been on anticoagulation for 3 weeks, it was decided to proceed with DCCV without TEE. The risks and benefits of the procedure were discussed with the patient and he agreed to proceed.   He underwent DCCV on 06/27/2015 and had return to [facility] following the procedure. There were no noted complications from the procedure. The patient was last examined by Dr. Sallyanne Kuster and deemed stable for discharge. He was  discharged on Metoprolol 25 bid.  He was seen for f/u in the afib clinic 11/8   and was staying in [place] as far as pt is aware. He denied any shortness of breath. Has not noticed any spells of being lightheaded.He understands importance of taking blood thinner and states he has not missed any doses. Continues on metoprolol at 25 mg bid.  F/u in the afib clinic 12/8, EKG shows SR. As far as pt is aware, he is maintaining sinus rhythm. He denies any irregular heart beat, dyspnea, chest pain. No problems mentioned. Continues on xarelto without issues.  Today, he denies symptoms of palpitations, chest pain, shortness of breath, orthopnea, PND, lower extremity edema, dizziness, presyncope, syncope, or neurologic sequela. The patient is tolerating medications without difficulties and is otherwise without complaint today.   Past Medical History  Diagnosis Date  . Coronary atherosclerosis of native coronary artery   . Pure hypercholesterolemia   . Other and unspecified hyperlipidemia   . Hypertension   . Gout   . Angina   . Atrial flutter ([place])   . Chronic kidney disease     hx of BPH  . Arthritis   . Myocardial infarction [facility])    Past Surgical History  Procedure Laterality Date  . Cataracts    . Coronary artery bypass graft  11/20/2011    Procedure: CORONARY ARTERY BYPASS GRAFTING (CABG);  Surgeon: Ivin Poot, MD;  Location: [place];  Service: Open Heart Surgery;  Laterality: N/A;  Times 3. On Pump. Using endoscopically harvested right greater saphenous  vein and left internal mammary artery.   . Maze  11/20/2011    Procedure: MAZE;  Surgeon: Ivin Poot, MD;  Location: [place];  Service: Open Heart Surgery;  Laterality: N/A;  . Chest tube insertion  11/22/2011    Procedure: CHEST TUBE INSERTION;  Surgeon: Ivin Poot, MD;  Location: [place];  Service: Thoracic;  Laterality: Right;  . Left and right heart catheterization with coronary angiogram N/A 11/16/2011    Procedure: LEFT AND RIGHT HEART CATHETERIZATION WITH CORONARY ANGIOGRAM;  Surgeon: Sherren Mocha, MD;   Location: [facility] CATH LAB;  Service: Cardiovascular;  Laterality: N/A;  . Electrophysiologic study N/A 06/27/2015    Procedure: Cardioversion;  Surgeon: Deboraha Sprang, MD;  Location: [place] CV LAB;  Service: Cardiovascular;  Laterality: N/A;    Current Outpatient Prescriptions  Medication Sig Dispense Refill  . allopurinol (ZYLOPRIM) 300 MG tablet Take 300 mg by mouth daily.      Marland Kitchen amLODipine (NORVASC) 10 MG tablet TAKE ONE TABLET BY MOUTH ONE  TIME DAILY 30 tablet 11  . atorvastatin (LIPITOR) 80 MG tablet TAKE ONE TABLET BY MOUTH ONE TIME DAILY 30 tablet 9  . ciprofloxacin (CIPRO) 500 MG tablet Take 1 tablet (500 mg total) by mouth 2 (two) times daily. 42 tablet 0  . doxazosin (CARDURA) 4 MG tablet Take 2 mg by mouth daily after supper.    . ferrous gluconate (FERGON) 324 MG tablet Take 324 mg by mouth 2 (two) times daily with a meal.    . furosemide (LASIX) 40 MG tablet TAKE ONE TABLET BY MOUTH ONE TIME DAILY (Patient taking differently: TAKE ONE TABLET BY MOUTH ONE TIME DAILY AFTER SUPPER) 30 tablet 0  . metoprolol (LOPRESSOR) 25 MG tablet Take 1 tablet (25 mg total) by mouth 2 (two) times daily. 60 tablet 6  . potassium chloride SA (K-DUR,KLOR-CON) 20 MEQ tablet TAKE ONE TABLET BY MOUTH TWICE DAILY 60 tablet 9  . rivaroxaban (XARELTO) 20 MG TABS tablet Take 1 tablet (20 mg total) by mouth daily with supper. (Patient taking differently: Take 20 mg by mouth daily after supper. ) 30 tablet 11   No current facility-administered medications for this encounter.    No Known Allergies  Social History   Social History  . Marital Status: Married    Spouse Name: [name]  . Number of Children: N/A  . Years of Education: N/A   Occupational History  . works with "heavy equipment"    Social History Main Topics  . Smoking status: Never Smoker   . Smokeless tobacco: Never Used  . Alcohol Use: Yes     Comment: Occasional   . Drug Use: No  . Sexual Activity: No   Other Topics Concern  .  Not on file   Social History Narrative    Family History  Problem Relation Age of Onset  . Colon cancer Son     ROS- All systems are reviewed and negative except as per the HPI above  Physical Exam: Filed Vitals:   08/04/15 1032  BP: 126/68  Pulse: 62  Height: 5\' 11"  (1.803 m)  Weight: 273 lb (123.832 kg)    GEN- The patient is well appearing, alert and oriented x 3 today.   Head- normocephalic, atraumatic Eyes-  Sclera clear, conjunctiva pink Ears- hearing intact Oropharynx- clear Neck- supple, no JVP Lymph- no cervical lymphadenopathy Lungs- Clear to ausculation bilaterally, normal work of breathing Heart- Regular rate and rhythm, no murmurs, rubs or gallops, PMI not  laterally displaced GI- soft, NT, ND, + BS Extremities- no clubbing, cyanosis, or edema MS- no significant deformity or atrophy Skin- no rash or lesion Psych- euthymic mood, full affect Neuro- strength and sensation are intact  EKG-SR 62 bpm,LAD, pr int 186 ms, qrs int 94 ms, qtc 391 ms  Epic records reviewed.  Assessment and Plan: 1. Aflutter S/p maze procedure for afib 2013 Successful DCCV Maintaining SR  Continue metoprolol 25 mg bid Continue xarelto 20mg  qd for chadsvasc score of at least 4.  2. CAD Stable  3. Htn well managed  F/u with Dr. Percival Spanish as scheduled 1/11 afib clinic as needed  Butch Penny C. Sammie Schermerhorn, [facility] [address] [place], [place] [address] [phone]

## 2015-08-15 ENCOUNTER — Other Ambulatory Visit: Payer: Self-pay | Admitting: Cardiology

## 2015-08-29 ENCOUNTER — Other Ambulatory Visit: Payer: Self-pay | Admitting: Cardiology

## 2015-08-30 NOTE — Telephone Encounter (Signed)
Rx request sent to pharmacy.  

## 2015-09-07 ENCOUNTER — Encounter: Payer: Self-pay | Admitting: Cardiology

## 2015-09-07 ENCOUNTER — Ambulatory Visit (INDEPENDENT_AMBULATORY_CARE_PROVIDER_SITE_OTHER): Payer: PPO | Admitting: Cardiology

## 2015-09-07 VITALS — BP 131/79 | HR 81 | Ht 71.0 in | Wt 263.0 lb

## 2015-09-07 DIAGNOSIS — I2510 Atherosclerotic heart disease of native coronary artery without angina pectoris: Secondary | ICD-10-CM

## 2015-09-07 MED ORDER — FUROSEMIDE 20 MG PO TABS: 20.0000 mg | ORAL_TABLET | Freq: Every day | ORAL | Status: DC

## 2015-09-07 NOTE — Progress Notes (Signed)
HPI The patient presents for followup of coronary disease status post CABG.  When  I last saw him in the clinic he had atrial flutter. He was started on beta blockers. However, it he required hospitalization with tachybradycardia syndrome. After 3 weeks of anticoagulation he was cardioverted. Subsequent follow-up in the atrial fibrillation clinic is demonstrated normal sinus rhythm.   Since that time he's had no symptomatic recurrence of this.  The patient denies any new symptoms such as chest discomfort, neck or arm discomfort. There has been no new shortness of breath, PND or orthopnea. There have been no reported palpitations, presyncope or syncope.   Unfortunately in early December his wife was killed in a head-on collision. They have been married for 62 years.  No Known Allergies  Current Outpatient Prescriptions  Medication Sig Dispense Refill  . allopurinol (ZYLOPRIM) 300 MG tablet Take 300 mg by mouth daily.      Marland Kitchen amLODipine (NORVASC) 10 MG tablet TAKE ONE TABLET BY MOUTH ONE  TIME DAILY 30 tablet 1  . atorvastatin (LIPITOR) 80 MG tablet TAKE ONE TABLET BY MOUTH ONE TIME DAILY 30 tablet 9  . doxazosin (CARDURA) 4 MG tablet Take 2 mg by mouth daily after supper.    . ferrous gluconate (FERGON) 324 MG tablet Take 324 mg by mouth 2 (two) times daily with a meal.    . furosemide (LASIX) 40 MG tablet TAKE ONE TABLET BY MOUTH ONE TIME DAILY 30 tablet 0  . metoprolol (LOPRESSOR) 25 MG tablet Take 1 tablet (25 mg total) by mouth 2 (two) times daily. 60 tablet 6  . potassium chloride SA (K-DUR,KLOR-CON) 20 MEQ tablet TAKE ONE TABLET BY MOUTH TWICE DAILY 60 tablet 9  . rivaroxaban (XARELTO) 20 MG TABS tablet Take 1 tablet (20 mg total) by mouth daily with supper. (Patient taking differently: Take 20 mg by mouth daily after supper. ) 30 tablet 11   No current facility-administered medications for this visit.    Past Medical History  Diagnosis Date  . Coronary atherosclerosis of native  coronary artery   . Pure hypercholesterolemia   . Other and unspecified hyperlipidemia   . Hypertension   . Gout   . Angina   . Atrial flutter ([place])   . Chronic kidney disease     hx of BPH  . Arthritis   . Myocardial infarction [facility])     Past Surgical History  Procedure Laterality Date  . Cataracts    . Coronary artery bypass graft  11/20/2011    Procedure: CORONARY ARTERY BYPASS GRAFTING (CABG);  Surgeon: Ivin Poot, MD;  Location: [place];  Service: Open Heart Surgery;  Laterality: N/A;  Times 3. On Pump. Using endoscopically harvested right greater saphenous vein and left internal mammary artery.   . Maze  11/20/2011    Procedure: MAZE;  Surgeon: Ivin Poot, MD;  Location: [place];  Service: Open Heart Surgery;  Laterality: N/A;  . Chest tube insertion  11/22/2011    Procedure: CHEST TUBE INSERTION;  Surgeon: Ivin Poot, MD;  Location: [place];  Service: Thoracic;  Laterality: Right;  . Left and right heart catheterization with coronary angiogram N/A 11/16/2011    Procedure: LEFT AND RIGHT HEART CATHETERIZATION WITH CORONARY ANGIOGRAM;  Surgeon: Sherren Mocha, MD;  Location: [facility] CATH LAB;  Service: Cardiovascular;  Laterality: N/A;  . Electrophysiologic study N/A 06/27/2015    Procedure: Cardioversion;  Surgeon: Deboraha Sprang, MD;  Location: [place] CV LAB;  Service: Cardiovascular;  Laterality: N/A;    ROS:  As stated in the HPI and negative for all other systems.  PHYSICAL EXAM BP 131/79 mmHg  Pulse 81  Ht 5\' 11"  (1.803 m)  Wt 263 lb (119.296 kg)  BMI 36.70 kg/m2 GENERAL:  Well appearing HEENT:  Pupils equal round and reactive, fundi not visualized, oral mucosa unremarkable, edentulous NECK:  No jugular venous distention, waveform within normal limits, carotid upstroke brisk and symmetric, no bruits, no thyromegaly LYMPHATICS:  No cervical, inguinal adenopathy LUNGS:  Clear to auscultation bilaterally CHEST:  Well healed sternotomy scar. HEART:  PMI not  displaced or sustained,S1 and S2 within normal limits, no S3, no S4 no clicks, no rubs, no murmurs ABD:  Flat, positive bowel sounds normal in frequency in pitch, no bruits, no rebound, no guarding, no midline pulsatile mass, no hepatomegaly, no splenomegaly EXT:  2 plus pulses throughout, trace edema, no cyanosis no clubbing   ASSESSMENT AND PLAN  CAD:  The patient has no new sypmtoms.  No further cardiovascular testing is indicated.   Of note he's had some low potassium. I will see if he does okay with less Lasix at 20 mg daily.  ATRIAL FLUTTER:   He has had no symptomatic recurrence of this since cardioversion.  Marcus [name] has a CHA2DS2 - VASc score of 4 with a risk of stroke of 4%.   He will continue on the meds as listed.  HTN:  The blood pressure is controlled.  He will continue the meds as listed.   DYSLIPIDEMIA:  I will defer follow up to Marcus File, MD

## 2015-09-07 NOTE — Patient Instructions (Signed)
Medication Instructions:  Please decrease Furosemide to 20 mg a day. Continue all other medications as listed.  Follow-Up: Follow up in 6 months with Dr. Percival Spanish in [place].  You will receive a letter in the mail 2 months before you are due.  Please call us when you receive this letter to schedule your follow up appointment.  If you need a refill on your cardiac medications before your next appointment, please call your pharmacy.  Thank you for choosing [facility]!!

## 2015-09-19 ENCOUNTER — Other Ambulatory Visit: Payer: Self-pay | Admitting: Cardiology

## 2015-09-19 NOTE — Telephone Encounter (Signed)
Rx request sent to pharmacy.  

## 2016-01-17 DIAGNOSIS — Z961 Presence of intraocular lens: Secondary | ICD-10-CM | POA: Diagnosis not present

## 2016-01-17 DIAGNOSIS — D2310 Other benign neoplasm of skin of unspecified eyelid, including canthus: Secondary | ICD-10-CM | POA: Diagnosis not present

## 2016-01-17 DIAGNOSIS — Z9842 Cataract extraction status, left eye: Secondary | ICD-10-CM | POA: Diagnosis not present

## 2016-01-17 DIAGNOSIS — H26491 Other secondary cataract, right eye: Secondary | ICD-10-CM | POA: Diagnosis not present

## 2016-02-09 DIAGNOSIS — R351 Nocturia: Secondary | ICD-10-CM | POA: Diagnosis not present

## 2016-02-09 DIAGNOSIS — N401 Enlarged prostate with lower urinary tract symptoms: Secondary | ICD-10-CM | POA: Diagnosis not present

## 2016-02-20 ENCOUNTER — Telehealth: Payer: Self-pay | Admitting: Cardiology

## 2016-02-20 NOTE — Telephone Encounter (Signed)
Needs appt for [facility] please

## 2016-02-21 ENCOUNTER — Encounter: Payer: Self-pay | Admitting: Family Medicine

## 2016-02-21 ENCOUNTER — Ambulatory Visit (INDEPENDENT_AMBULATORY_CARE_PROVIDER_SITE_OTHER): Payer: PPO | Admitting: Family Medicine

## 2016-02-21 VITALS — BP 145/77 | HR 62 | Temp 97.4°F | Ht 71.0 in | Wt 263.6 lb

## 2016-02-21 DIAGNOSIS — B880 Other acariasis: Secondary | ICD-10-CM

## 2016-02-21 MED ORDER — TRIAMCINOLONE ACETONIDE 0.5 % EX OINT: 1.0000 "application " | TOPICAL_OINTMENT | Freq: Two times a day (BID) | CUTANEOUS | Status: DC

## 2016-02-21 NOTE — Progress Notes (Signed)
   HPI  Patient presents today here with itchy red spots on his feet.  Patient explains that over the last 3 days she's had a box which are very itchy. He's used over-the-counter itching cream which helps some but only works for a short time.  They started the day after he did a lot of yard work.  He also has some itchy red bumps on his upper legs.  He has some different appearing itchy red areas on his arms that are consistent with mosquito bites.  PMH: Smoking status noted ROS: Per HPI  Objective: BP 145/77 mmHg  Pulse 62  Temp(Src) 97.4 F (36.3 C) (Oral)  Ht 5\' 11"  (1.803 m)  Wt 263 lb 9.6 oz (119.568 kg)  BMI 36.78 kg/m2 Gen: NAD, alert, cooperative with exam HEENT: NCAT CV: RRR, good S1/S2, no murmur Resp: CTABL, no wheezes, non-labored Ext: No edema, warm Neuro: Alert and oriented, No gross deficits  Assessment and plan:  # Arthropod bite, Chigger bites Discussed usual course of illness Given triamcinolone ointment for itching. Return to clinic with any worsening, failure to improve, or new concerns.       Meds ordered this encounter  Medications  . triamcinolone ointment (KENALOG) 0.5 %    Sig: Apply 1 application topically 2 (two) times daily.    Dispense:  30 g    Refill:  [place], MD [facility] 02/21/2016, 10:01 AM

## 2016-02-21 NOTE — Patient Instructions (Signed)
Great to see you!  It looks like you have chigger bites to me, they should heal within 1 week, Try the ointment I have prescribed 2-3 times daily for 1 week.   Avoid the groin and underarms with the ointment

## 2016-03-01 ENCOUNTER — Other Ambulatory Visit: Payer: Self-pay | Admitting: *Deleted

## 2016-03-01 MED ORDER — RIVAROXABAN 20 MG PO TABS: 20.0000 mg | ORAL_TABLET | Freq: Every day | ORAL | Status: DC

## 2016-03-13 ENCOUNTER — Other Ambulatory Visit: Payer: Self-pay | Admitting: *Deleted

## 2016-03-13 MED ORDER — ATORVASTATIN CALCIUM 80 MG PO TABS: 80.0000 mg | ORAL_TABLET | Freq: Every day | ORAL | Status: DC

## 2016-03-14 ENCOUNTER — Other Ambulatory Visit: Payer: Self-pay

## 2016-03-23 ENCOUNTER — Telehealth: Payer: Self-pay | Admitting: Orthopaedic Surgery

## 2016-03-23 ENCOUNTER — Other Ambulatory Visit: Payer: Self-pay | Admitting: Cardiology

## 2016-03-26 NOTE — Telephone Encounter (Signed)
Rx request sent to pharmacy.  

## 2016-03-27 ENCOUNTER — Other Ambulatory Visit: Payer: Self-pay | Admitting: Cardiology

## 2016-04-19 ENCOUNTER — Encounter: Payer: Self-pay | Admitting: Cardiology

## 2016-04-24 ENCOUNTER — Other Ambulatory Visit: Payer: Self-pay | Admitting: Student

## 2016-04-24 NOTE — Telephone Encounter (Signed)
Rx request sent to pharmacy.  

## 2016-04-30 NOTE — Progress Notes (Signed)
HPI The patient presents for followup of coronary disease status post CABG and atrial flutter. He required hospitalization with tachybradycardia syndrome. After 3 weeks of anticoagulation he was cardioverted. Subsequent follow-up in the atrial fibrillation clinic is demonstrated normal sinus rhythm.  Unfortunately in early December his wife was killed in a head-on collision. They have been married for 62 years.  Since I last saw him he has done well. The patient denies any new symptoms such as chest discomfort, neck or arm discomfort. There has been no new shortness of breath, PND or orthopnea. There have been no reported palpitations, presyncope or syncope.  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     No Known Allergies  Current Outpatient Prescriptions  Medication Sig Dispense Refill  . allopurinol (ZYLOPRIM) 300 MG tablet TAKE ONE TABLET BY MOUTH ONE TIME DAILY 30 tablet 5  . amLODipine (NORVASC) 10 MG tablet TAKE ONE TABLET BY MOUTH ONE  TIME DAILY 30 tablet 1  . atorvastatin (LIPITOR) 80 MG tablet Take 1 tablet (80 mg total) by mouth daily. 30 tablet 9  . doxazosin (CARDURA) 4 MG tablet Take 2 mg by mouth daily after supper.    . ferrous gluconate (FERGON) 324 MG tablet Take 324 mg by mouth 2 (two) times daily with a meal.    . furosemide (LASIX) 40 MG tablet TAKE ONE TABLET BY MOUTH ONE TIME DAILY 30 tablet 5  . KLOR-CON M20 20 MEQ tablet TAKE ONE TABLET BY MOUTH TWICE DAILY 60 tablet 9  . metoprolol tartrate (LOPRESSOR) 25 MG tablet TAKE 1 TABLET (25 MG TOTAL) BY MOUTH 2 (TWO) TIMES DAILY. 60 tablet 6  . rivaroxaban (XARELTO) 20 MG TABS tablet Take 1 tablet (20 mg total) by mouth daily with supper. 30 tablet 5   No current facility-administered medications for this visit.     Past Medical History:  Diagnosis Date  . Angina   . Arthritis   . Atrial flutter ([place])   . Chronic kidney disease    hx of BPH  . Coronary atherosclerosis  of native coronary artery   . Gout   . Hypertension   . Myocardial infarction ([place])   . Other and unspecified hyperlipidemia   . Pure hypercholesterolemia     Past Surgical History:  Procedure Laterality Date  . CATARACTS    . CHEST TUBE INSERTION  11/22/2011   Procedure: CHEST TUBE INSERTION;  Surgeon: Ivin Poot, MD;  Location: [place];  Service: Thoracic;  Laterality: Right;  . CORONARY ARTERY BYPASS GRAFT  11/20/2011   Procedure: CORONARY ARTERY BYPASS GRAFTING (CABG);  Surgeon: Ivin Poot, MD;  Location: [place];  Service: Open Heart Surgery;  Laterality: N/A;  Times 3. On Pump. Using endoscopically harvested right greater saphenous vein and left internal mammary artery.   Marland Kitchen ELECTROPHYSIOLOGIC STUDY N/A 06/27/2015   Procedure: Cardioversion;  Surgeon: Deboraha Sprang, MD;  Location: [place] CV LAB;  Service: Cardiovascular;  Laterality: N/A;  . LEFT AND RIGHT HEART CATHETERIZATION WITH CORONARY ANGIOGRAM N/A 11/16/2011   Procedure: LEFT AND RIGHT HEART CATHETERIZATION WITH CORONARY ANGIOGRAM;  Surgeon: Sherren Mocha, MD;  Location[facility] CATH LAB;  Service: Cardiovascular;  Laterality: N/A;  . MAZE  11/20/2011   Procedure: MAZE;  Surgeon: Ivin Poot, MD;  Location: [place];  Service: Open Heart Surgery;  Laterality: N/A;    ROS:  As stated in the HPI and  negative for all other systems.  PHYSICAL EXAM BP 138/70   Pulse 60   Ht 5\' 11"  (1.803 m)   Wt 265 lb (120.2 kg)   BMI 36.96 kg/m  GENERAL:  Well appearing HEENT:  Pupils equal round and reactive, fundi not visualized, oral mucosa unremarkable, edentulous NECK:  No jugular venous distention, waveform within normal limits, carotid upstroke brisk and symmetric, no bruits, no thyromegaly LYMPHATICS:  No cervical, inguinal adenopathy LUNGS:  Clear to auscultation bilaterally CHEST:  Well healed sternotomy scar. HEART:  PMI not displaced or sustained,S1 and S2 within normal limits, no S3, no S4 no clicks, no rubs, no  murmurs ABD:  Flat, positive bowel sounds normal in frequency in pitch, no bruits, no rebound, no guarding, no midline pulsatile mass, no hepatomegaly, no splenomegaly EXT:  2 plus pulses throughout, trace edema, no cyanosis no clubbing  EKG:  Sinus rhythm, rate 59, left axis deviation, old anteroseptal infarct with lateral T-wave inversions unchanged previous  ASSESSMENT AND PLAN  CAD:  The patient has no new sypmtoms.  No further cardiovascular testing is indicated.  We will continue with aggressive risk reduction and meds as listed.  ATRIAL FLUTTER:   He has had no symptomatic recurrence of this since cardioversion.  [name] has a CHA2DS2 - VASc score of 4 with a risk of stroke of 4%.   He will continue on the meds as listed.  HTN:  The blood pressure is controlled.  He will continue the meds as listed.   DYSLIPIDEMIA:  I don't see a recent lipid profile liver enzymes and I will order these.

## 2016-05-02 ENCOUNTER — Encounter: Payer: Self-pay | Admitting: Cardiology

## 2016-05-02 ENCOUNTER — Ambulatory Visit (INDEPENDENT_AMBULATORY_CARE_PROVIDER_SITE_OTHER): Payer: PPO | Admitting: Cardiology

## 2016-05-02 VITALS — BP 138/70 | HR 60 | Ht 71.0 in | Wt 265.0 lb

## 2016-05-02 DIAGNOSIS — I10 Essential (primary) hypertension: Secondary | ICD-10-CM | POA: Diagnosis not present

## 2016-05-02 DIAGNOSIS — Z79899 Other long term (current) drug therapy: Secondary | ICD-10-CM

## 2016-05-02 DIAGNOSIS — I4892 Unspecified atrial flutter: Secondary | ICD-10-CM | POA: Diagnosis not present

## 2016-05-02 DIAGNOSIS — E7800 Pure hypercholesterolemia, unspecified: Secondary | ICD-10-CM | POA: Diagnosis not present

## 2016-05-02 NOTE — Patient Instructions (Addendum)
Medication Instructions:  The current medical regimen is effective;  continue present plan and medications.  Labwork: Please have fasting blood work at [facility] ([place])  Follow-Up: Follow up in 6 months with Dr. Percival Spanish.  You will receive a letter in the mail 2 months before you are due.  Please call us when you receive this letter to schedule your follow up appointment.  If you need a refill on your cardiac medications before your next appointment, please call your pharmacy.  Thank you for choosing [facility]!!

## 2016-05-08 ENCOUNTER — Other Ambulatory Visit: Payer: PPO

## 2016-05-08 DIAGNOSIS — E7800 Pure hypercholesterolemia, unspecified: Secondary | ICD-10-CM | POA: Diagnosis not present

## 2016-05-08 DIAGNOSIS — Z79899 Other long term (current) drug therapy: Secondary | ICD-10-CM | POA: Diagnosis not present

## 2016-05-09 LAB — LIPID PANEL
CHOLESTEROL TOTAL: 144 mg/dL (ref 100–199)
Chol/HDL Ratio: 3.9 ratio units (ref 0.0–5.0)
HDL: 37 mg/dL — ABNORMAL LOW (ref >39)
LDL Calculated: 79 mg/dL (ref 0–99)
Triglycerides: 138 mg/dL (ref 0–149)
VLDL CHOLESTEROL CAL: 28 mg/dL (ref 5–40)

## 2016-05-09 LAB — HEPATIC FUNCTION PANEL
ALK PHOS: 115 IU/L (ref 39–117)
ALT: 8 IU/L (ref 0–44)
AST: 11 IU/L (ref 0–40)
Albumin: 4 g/dL (ref 3.5–4.7)
BILIRUBIN, DIRECT: 0.19 mg/dL (ref 0.00–0.40)
Bilirubin Total: 0.9 mg/dL (ref 0.0–1.2)
Total Protein: 6.9 g/dL (ref 6.0–8.5)

## 2016-05-09 LAB — PLEASE NOTE

## 2016-06-04 ENCOUNTER — Ambulatory Visit (INDEPENDENT_AMBULATORY_CARE_PROVIDER_SITE_OTHER): Payer: PPO | Admitting: Family Medicine

## 2016-06-04 ENCOUNTER — Ambulatory Visit (INDEPENDENT_AMBULATORY_CARE_PROVIDER_SITE_OTHER): Payer: PPO

## 2016-06-04 ENCOUNTER — Encounter: Payer: Self-pay | Admitting: Family Medicine

## 2016-06-04 VITALS — BP 138/86 | HR 66 | Temp 98.3°F | Ht 71.0 in | Wt 261.0 lb

## 2016-06-04 DIAGNOSIS — R109 Unspecified abdominal pain: Secondary | ICD-10-CM

## 2016-06-04 LAB — MICROSCOPIC EXAMINATION: Epithelial Cells (non renal): NONE SEEN /hpf (ref 0–10)

## 2016-06-04 LAB — URINALYSIS, COMPLETE
BILIRUBIN UA: NEGATIVE
Glucose, UA: NEGATIVE
Ketones, UA: NEGATIVE
Nitrite, UA: NEGATIVE
PH UA: 5.5 (ref 5.0–7.5)
PROTEIN UA: NEGATIVE
Specific Gravity, UA: 1.015 (ref 1.005–1.030)
Urobilinogen, Ur: 2 mg/dL — ABNORMAL HIGH (ref 0.2–1.0)

## 2016-06-04 MED ORDER — CYCLOBENZAPRINE HCL 10 MG PO TABS: 10.0000 mg | ORAL_TABLET | Freq: Three times a day (TID) | ORAL | 1 refills | Status: DC | PRN

## 2016-06-04 MED ORDER — PREDNISONE 10 MG PO TABS: ORAL_TABLET | ORAL | 0 refills | Status: DC

## 2016-06-04 NOTE — Progress Notes (Signed)
Subjective:  Patient ID: Marcus [name], male    DOB: [birth year]  Age: 82 y.o. MRN: [id number]  CC: Back Pain (pt here today c/o left lower back pain x 1 week and it has gotten worse over the past week.)   HPI Lucien L Cleek presents for points to left flank and says Pain is severe. Not changed by movement. Minimal improvement laying still. Has urinary frequency that is chronic. No dysuria, but pain radiates to LLQ. 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 The pain is described as a throbbing and achy. There is no cramping. Onset one week ago with worsening over time. He's been continuing his usual activities but those are becoming much more difficult.   History Ameir has a past medical history of Angina; Arthritis; Atrial flutter ([place]); Chronic kidney disease; Coronary atherosclerosis of native coronary artery; Gout; Hypertension; Myocardial infarction; Other and unspecified hyperlipidemia; and Pure hypercholesterolemia.   He has a past surgical history that includes CATARACTS; Coronary artery bypass graft (11/20/2011); MAZE (11/20/2011); Chest tube insertion (11/22/2011); left and right heart catheterization with coronary angiogram (N/A, 11/16/2011); and Cardiac catheterization (N/A, 06/27/2015).   His family history includes Colon cancer in his son.He reports that he has never smoked. He has never used smokeless tobacco. He reports that he drinks alcohol. He reports that he does not use drugs.    ROS Review of Systems  Constitutional: Negative for chills, diaphoresis and fever.  HENT: Negative.   Respiratory: Negative for cough and shortness of breath.   Cardiovascular: Negative for chest pain.  Gastrointestinal: Negative for abdominal pain, constipation and diarrhea.  Genitourinary: Negative for dysuria and flank pain.  Musculoskeletal: Positive for arthralgias, back pain and myalgias. Negative for joint swelling.  Skin: Negative for rash.  Neurological:  Positive for numbness.  Psychiatric/Behavioral: Positive for sleep disturbance (due to pain). Negative for dysphoric mood.    Objective:  BP 138/86   Pulse 66   Temp 98.3 F (36.8 C) (Oral)   Ht 5\' 11"  (1.803 m)   Wt 261 lb (118.4 kg)   BMI 36.40 kg/m   BP Readings from Last 3 Encounters:  06/04/16 138/86  05/02/16 138/70  02/21/16 (!) 145/77    Wt Readings from Last 3 Encounters:  06/04/16 261 lb (118.4 kg)  05/02/16 265 lb (120.2 kg)  02/21/16 263 lb 9.6 oz (119.6 kg)     Physical Exam  Constitutional: He is oriented to person, place, and time. He appears well-developed and well-nourished. He appears distressed.  HENT:  Head: Normocephalic.  Eyes: Pupils are equal, round, and reactive to light.  Neck: Normal range of motion.  Cardiovascular: Normal rate, regular rhythm and normal heart sounds.   No murmur heard. Pulmonary/Chest: Effort normal and breath sounds normal.  Abdominal: There is no tenderness.  Musculoskeletal: He exhibits tenderness.  Neurological: He is alert and oriented to person, place, and time. He has normal reflexes.  Skin: Skin is warm and dry.  Psychiatric: He has a normal mood and affect. His behavior is normal. Thought content normal.  Vitals reviewed.    Lab Results  Component Value Date   WBC 5.7 06/28/2015   HGB 12.1 (L) 06/28/2015   HCT 37.5 (L) 06/28/2015   PLT 151 06/28/2015   GLUCOSE 93 06/28/2015   CHOL 144 05/08/2016   TRIG 138 05/08/2016   HDL 37 (L) 05/08/2016   LDLCALC 79 05/08/2016   ALT 8 05/08/2016   AST  11 05/08/2016   NA 135 06/28/2015   K 3.4 (L) 06/28/2015   CL 97 (L) 06/28/2015   CREATININE 1.06 06/28/2015   BUN 13 06/28/2015   CO2 29 06/28/2015   TSH 2.475 06/26/2015   INR 1.38 11/20/2011   HGBA1C 5.8 (H) 11/19/2011    No results found.  Assessment & Plan:   [name] was seen today for back pain.  Diagnoses and all orders for this visit:  Acute left flank pain -     Urinalysis, Complete -     DG  Lumbar Spine 2-3 Views; Future -     DG Abd 1 View; Future  Other orders -     predniSONE (DELTASONE) 10 MG tablet; Take 5 daily for 3 days followed by 4,3,2 and 1 for 3 days each. -     cyclobenzaprine (FLEXERIL) 10 MG tablet; Take 1 tablet (10 mg total) by mouth 3 (three) times daily as needed for muscle spasms. -     Microscopic Examination  KUB: There is one area near the left renal pelvis that could represent a phlebolith versus ureteral stone., Await wet read by radiology.  Lumbar spine series: Significant degenerative joint and degenerative disc disease noted along with posterior wedging of the discs.  UA shows trace red cells and 3-10 per high-power field.    I am having Mr. Slankard start on predniSONE and cyclobenzaprine. I am also having him maintain his doxazosin, ferrous gluconate, amLODipine, rivaroxaban, atorvastatin, allopurinol, KLOR-CON M20, furosemide, metoprolol tartrate, and FLUZONE HIGH-DOSE.  Meds ordered this encounter  Medications  . FLUZONE HIGH-DOSE 0.5 ML SUSY  . predniSONE (DELTASONE) 10 MG tablet    Sig: Take 5 daily for 3 days followed by 4,3,2 and 1 for 3 days each.    Dispense:  45 tablet    Refill:  0  . cyclobenzaprine (FLEXERIL) 10 MG tablet    Sig: Take 1 tablet (10 mg total) by mouth 3 (three) times daily as needed for muscle spasms.    Dispense:  90 tablet    Refill:  1     Follow-up: Return in about 2 weeks (around 06/18/2016), or if symptoms worsen or fail to improve.  Claretta Fraise, M.D.

## 2016-08-23 ENCOUNTER — Other Ambulatory Visit: Payer: Self-pay | Admitting: *Deleted

## 2016-08-23 MED ORDER — AMLODIPINE BESYLATE 10 MG PO TABS: 10.0000 mg | ORAL_TABLET | Freq: Every day | ORAL | 2 refills | Status: DC

## 2016-10-03 ENCOUNTER — Other Ambulatory Visit: Payer: Self-pay | Admitting: Cardiology

## 2016-10-03 ENCOUNTER — Telehealth: Payer: Self-pay | Admitting: Orthopaedic Surgery

## 2016-12-27 ENCOUNTER — Other Ambulatory Visit: Payer: Self-pay | Admitting: Cardiology

## 2017-01-24 ENCOUNTER — Other Ambulatory Visit: Payer: Self-pay | Admitting: Cardiology

## 2017-01-24 NOTE — Telephone Encounter (Signed)
REFILL 

## 2017-02-14 ENCOUNTER — Other Ambulatory Visit: Payer: Self-pay | Admitting: Cardiology

## 2017-02-14 NOTE — Telephone Encounter (Signed)
Rx(s) sent to pharmacy electronically.  

## 2017-02-23 ENCOUNTER — Other Ambulatory Visit: Payer: Self-pay | Admitting: Cardiology

## 2017-02-28 DIAGNOSIS — R351 Nocturia: Secondary | ICD-10-CM | POA: Diagnosis not present

## 2017-02-28 DIAGNOSIS — N401 Enlarged prostate with lower urinary tract symptoms: Secondary | ICD-10-CM | POA: Diagnosis not present

## 2017-03-06 ENCOUNTER — Other Ambulatory Visit: Payer: Self-pay | Admitting: Cardiology

## 2017-03-06 IMAGING — DX DG LUMBAR SPINE 2-3V
3 series · 3 of 3 positions shown · non-contrast
Comparison: KUB of today's date

CLINICAL DATA: Left flank pain and low back pain.

EXAM:
LUMBAR SPINE - 2-3 VIEW

[l-spine ap]
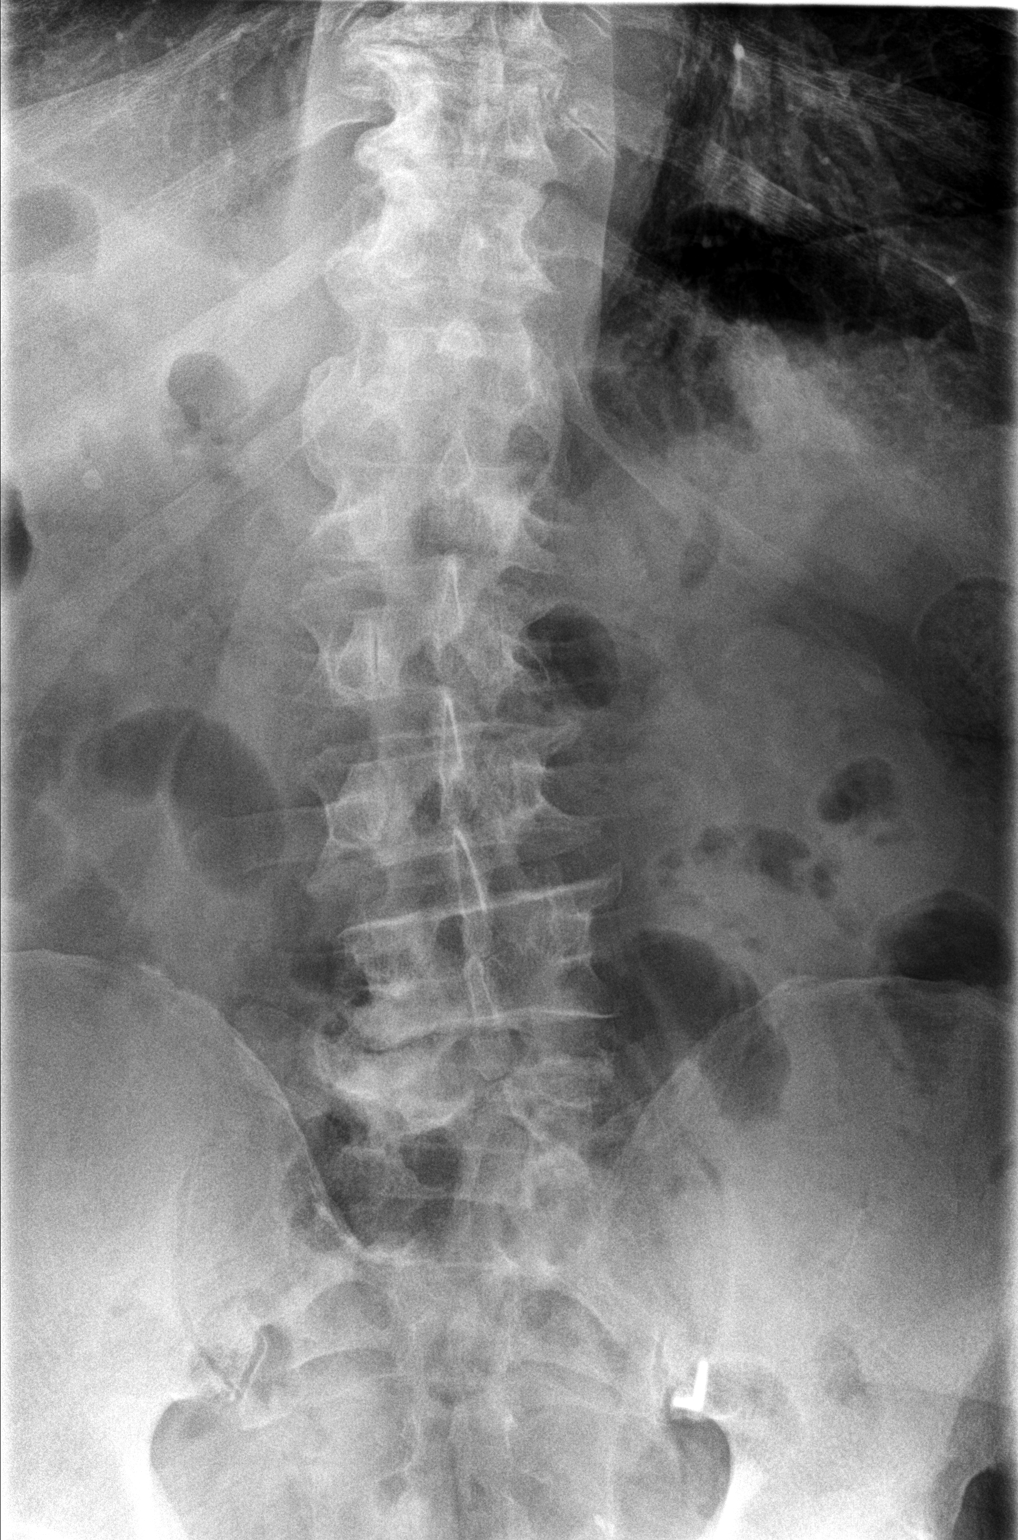

[l-spine lat (1 of 2)]
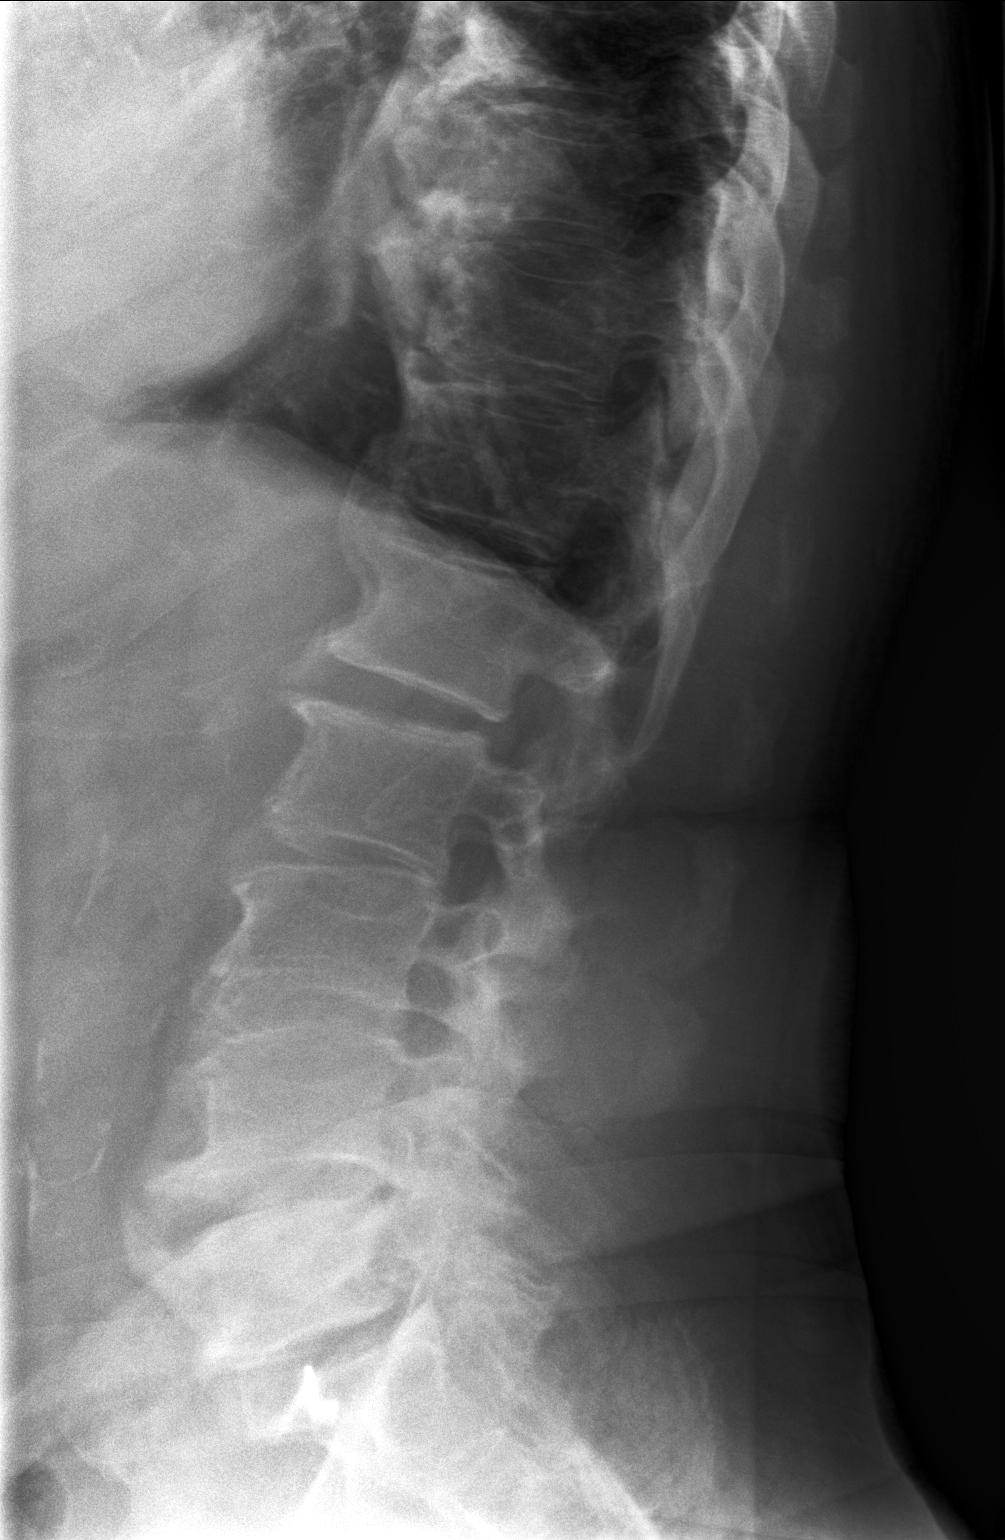

[l-spine lat (2 of 2)]
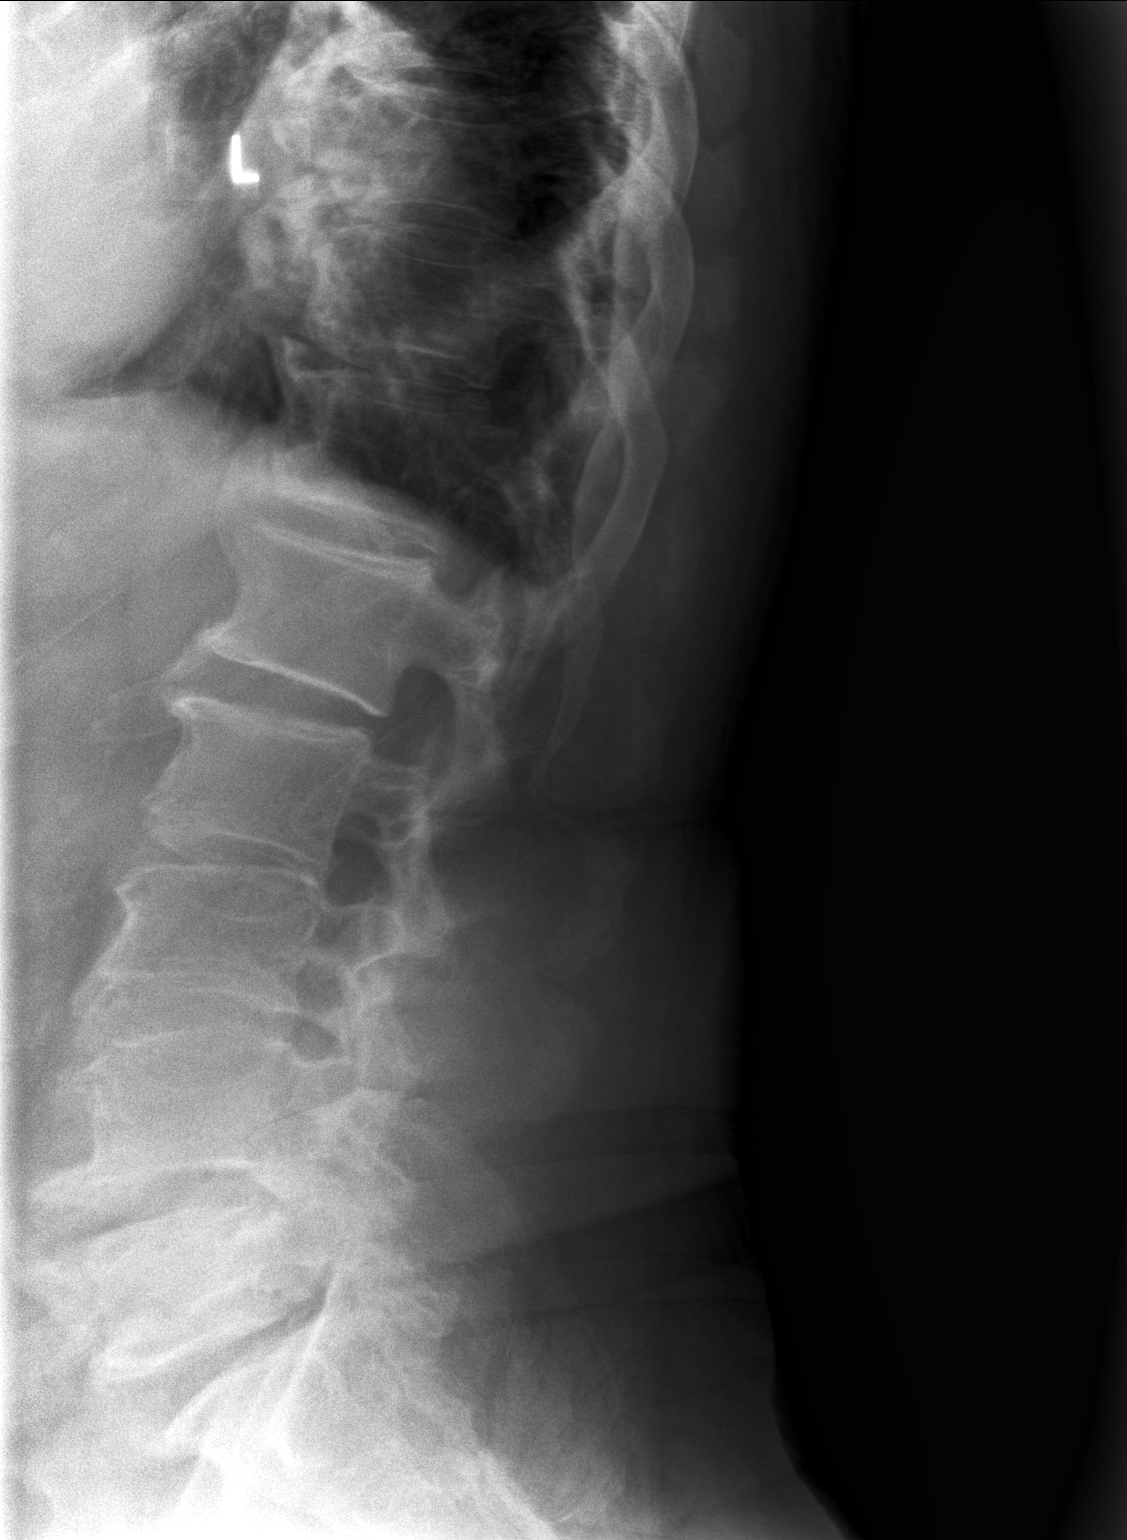

[3 of 3 positions shown; findings below may reference images not displayed]

FINDINGS: There is mild dextrocurvature centered at L1-2. The lumbar vertebral
bodies are preserved in height. There is mild multilevel
degenerative disc space narrowing with endplate osteophyte
formation. The pedicles and transverse processes are intact. There
is facet joint hypertrophy at L4-5 and L5-S1. There is calcification
projecting over the right renal fossa which may reflect a 5 mm
stone.
IMPRESSION: Multilevel mild to moderate degenerative disc space narrowing with
prominent endplate osteophytes. No compression fracture. Mild
dextrocurvature of the thoracolumbar spine.

Possible 5 mm right-sided kidney stone. No definite stones observed
on the left.

## 2017-03-06 IMAGING — DX DG ABDOMEN 1V
2 series · 2 of 2 positions shown · non-contrast
Comparison: None.

CLINICAL DATA: Left-sided flank pain

EXAM:
ABDOMEN - 1 VIEW

[abdomen kub (1 of 2)]
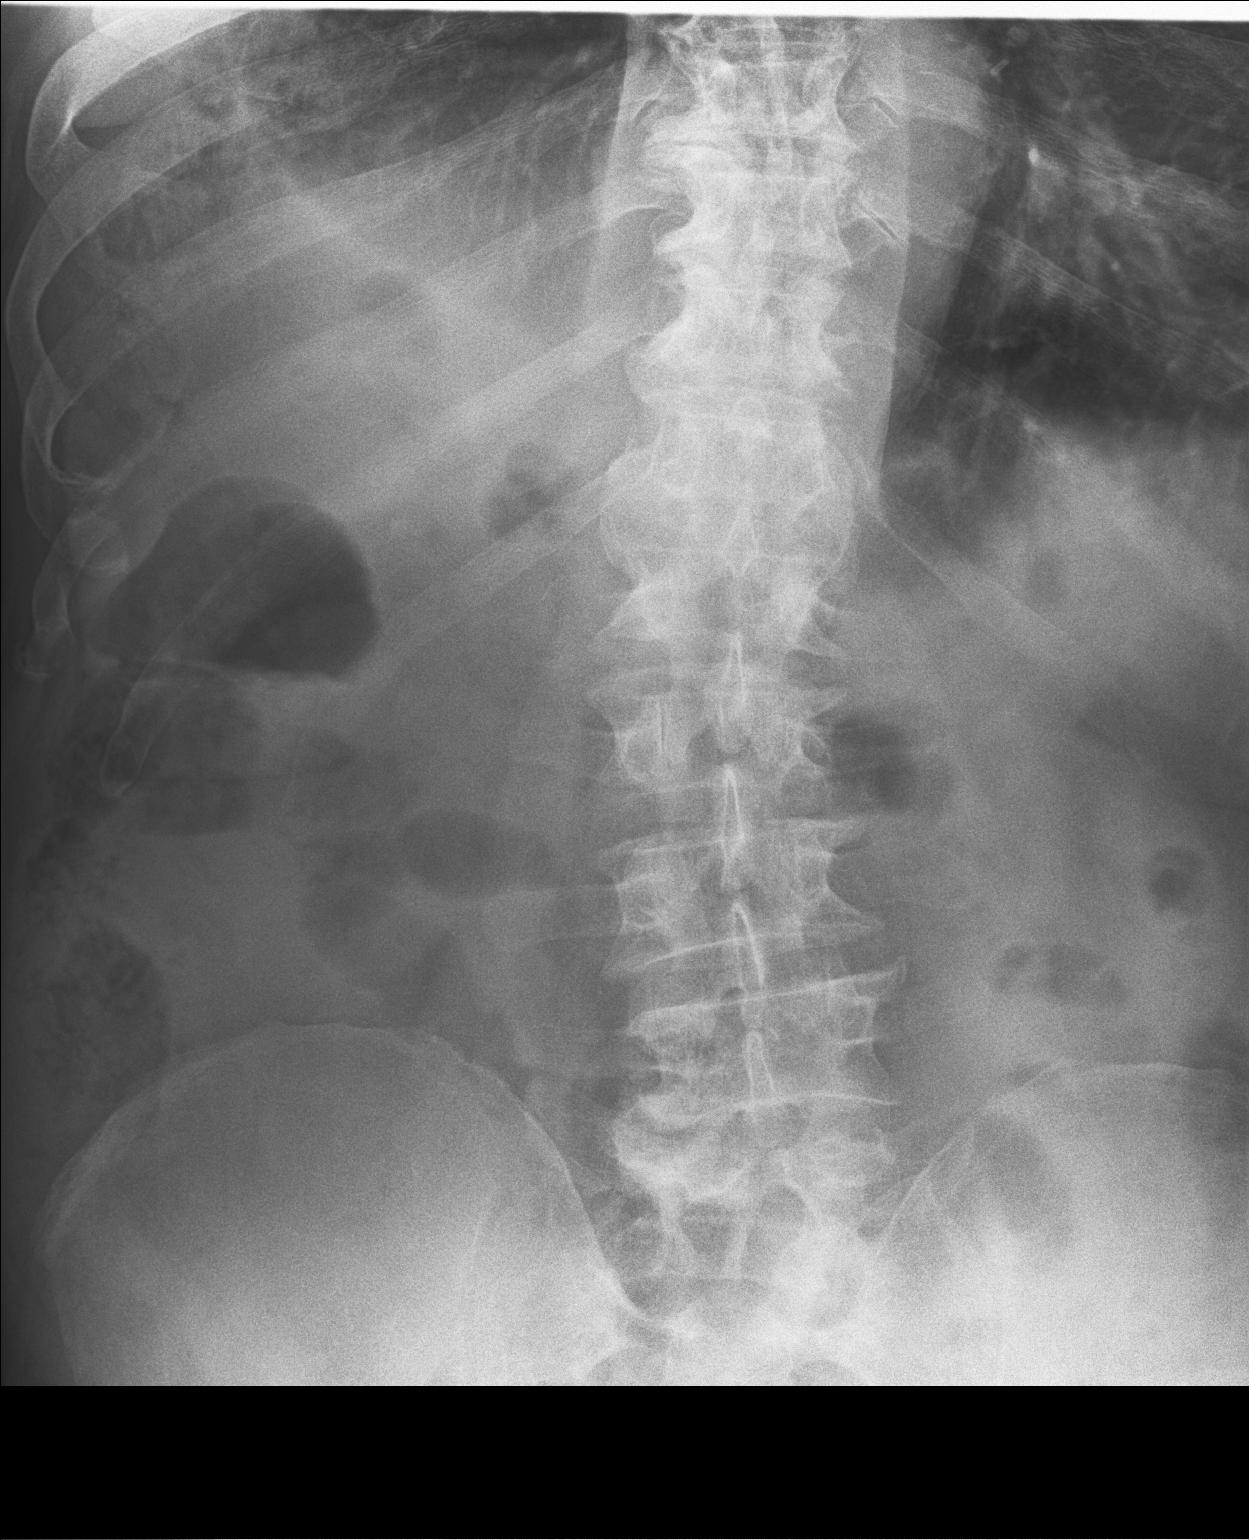

[abdomen kub (2 of 2)]
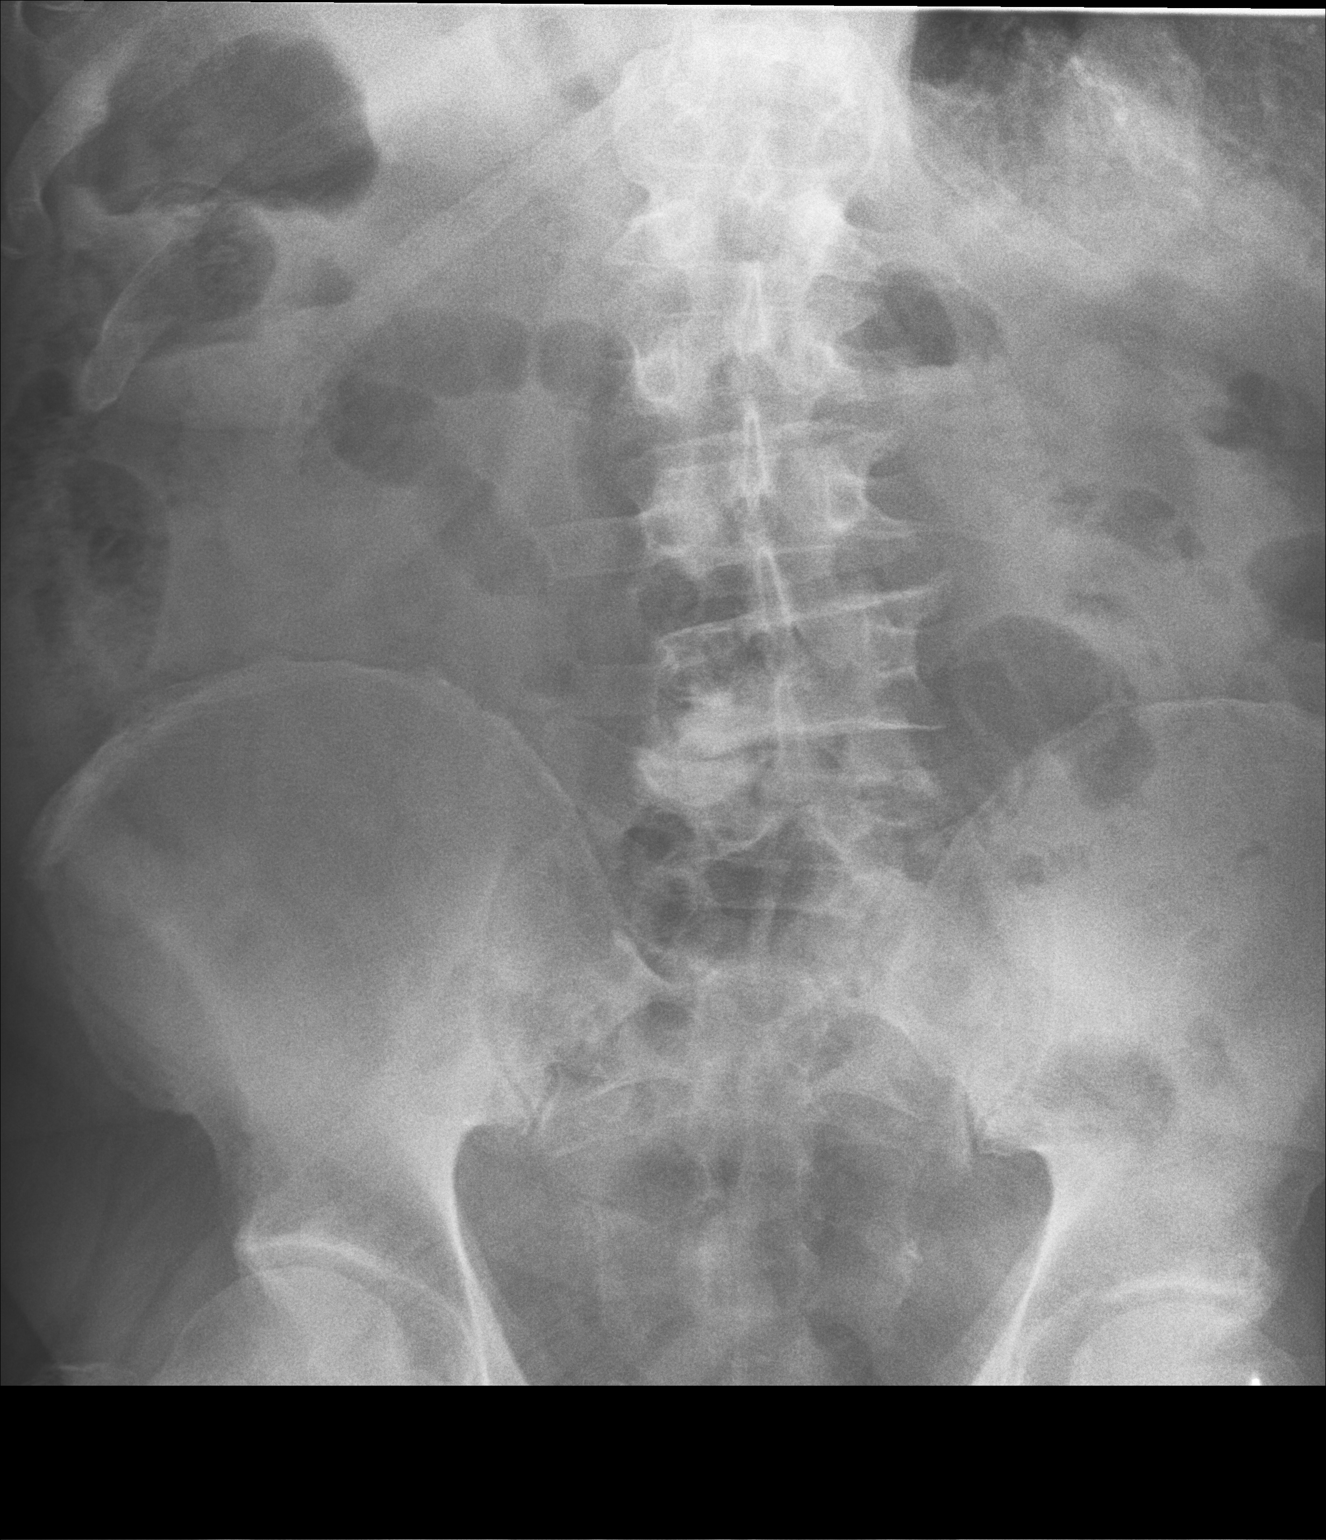

[2 of 2 positions shown; findings below may reference images not displayed]

FINDINGS: There are multiple prostatic calculi. There also phleboliths in the
pelvis. There is mild stool in the colon. There is no bowel
dilatation or air-fluid level suggesting bowel obstruction. No
evident free air. There is dextroscoliosis in the lumbar spine with
degenerative change in this area.
IMPRESSION: Bowel gas pattern unremarkable. Multiple prostatic calculi.
Phleboliths in pelvis.

## 2017-03-07 NOTE — Telephone Encounter (Signed)
Rx(s) sent to pharmacy electronically.  

## 2017-03-09 ENCOUNTER — Other Ambulatory Visit: Payer: Self-pay | Admitting: Cardiology

## 2017-03-15 ENCOUNTER — Telehealth: Payer: Self-pay | Admitting: Family Medicine

## 2017-03-15 MED ORDER — POTASSIUM CHLORIDE CRYS ER 20 MEQ PO TBCR: 20.0000 meq | EXTENDED_RELEASE_TABLET | Freq: Two times a day (BID) | ORAL | 0 refills | Status: DC

## 2017-03-15 NOTE — Telephone Encounter (Signed)
Refill called into [facility] Quantity of 12 only Okayed per Dr Wendi Snipes Pt must keep follow up appt

## 2017-03-19 ENCOUNTER — Encounter: Payer: Self-pay | Admitting: Family Medicine

## 2017-03-19 ENCOUNTER — Ambulatory Visit (INDEPENDENT_AMBULATORY_CARE_PROVIDER_SITE_OTHER): Payer: PPO | Admitting: Family Medicine

## 2017-03-19 VITALS — BP 137/79 | HR 67 | Temp 98.0°F | Ht 71.0 in | Wt 261.2 lb

## 2017-03-19 DIAGNOSIS — I10 Essential (primary) hypertension: Secondary | ICD-10-CM | POA: Diagnosis not present

## 2017-03-19 DIAGNOSIS — R7989 Other specified abnormal findings of blood chemistry: Secondary | ICD-10-CM

## 2017-03-19 DIAGNOSIS — I480 Paroxysmal atrial fibrillation: Secondary | ICD-10-CM | POA: Diagnosis not present

## 2017-03-19 DIAGNOSIS — R946 Abnormal results of thyroid function studies: Secondary | ICD-10-CM

## 2017-03-19 DIAGNOSIS — E7800 Pure hypercholesterolemia, unspecified: Secondary | ICD-10-CM

## 2017-03-19 DIAGNOSIS — M1A9XX0 Chronic gout, unspecified, without tophus (tophi): Secondary | ICD-10-CM | POA: Diagnosis not present

## 2017-03-19 MED ORDER — ATORVASTATIN CALCIUM 80 MG PO TABS: 80.0000 mg | ORAL_TABLET | Freq: Every day | ORAL | 3 refills | Status: DC

## 2017-03-19 MED ORDER — FUROSEMIDE 40 MG PO TABS: 40.0000 mg | ORAL_TABLET | Freq: Every day | ORAL | 3 refills | Status: DC

## 2017-03-19 MED ORDER — POTASSIUM CHLORIDE CRYS ER 20 MEQ PO TBCR: 20.0000 meq | EXTENDED_RELEASE_TABLET | Freq: Two times a day (BID) | ORAL | 3 refills | Status: DC

## 2017-03-19 MED ORDER — ALLOPURINOL 300 MG PO TABS: 300.0000 mg | ORAL_TABLET | Freq: Every day | ORAL | 3 refills | Status: DC

## 2017-03-19 MED ORDER — RIVAROXABAN 20 MG PO TABS: 20.0000 mg | ORAL_TABLET | Freq: Every day | ORAL | 3 refills | Status: DC

## 2017-03-19 MED ORDER — AMLODIPINE BESYLATE 10 MG PO TABS: 10.0000 mg | ORAL_TABLET | Freq: Every day | ORAL | 3 refills | Status: DC

## 2017-03-19 NOTE — Patient Instructions (Signed)
Great to see you!  Come back in 6 months unless you need us sooner.    

## 2017-03-19 NOTE — Progress Notes (Signed)
   HPI  Patient presents today here for follow-up chronic medical conditions Atrial fibrillation No tachycardia or palpitations, no bleeding. Good medication compliance.  Hypertension No chest pain, dyspnea, headaches Not really watching diet Good medication compliance.  Hyperlipidemia Arley watching diet Patient is occupationally active  PMH: Smoking status noted ROS: Per HPI  Objective: BP 137/79   Pulse 67   Temp 98 F (36.7 C) (Oral)   Ht '5\' 11"'$  (1.803 m)   Wt 261 lb 3.2 oz (118.5 kg)   BMI 36.43 kg/m  Gen: NAD, alert, cooperative with exam HEENT: NCAT, EOMI, PERRL CV: RRR, good S1/S2,  Resp: CTABL, no wheezes, non-labored Ext: trace edema BL symmetric Neuro: Alert and oriented, No gross deficits  Assessment and plan:  # Paroxysmal atrial fibrillation Rate controlled today, no irregular heart rate on exam Continue anticoagulation, CBC Continue metoprolol  # Hypertension Well-controlled on current medications, no changes Labs  # Gout Well-controlled, completely asymptomatic Tolerating allopurinol well Uric acid  # Hyperlipidemia, hypercholesterolemia Repeat labs, continue Lipitor Clinically doing well    Orders Placed This Encounter  Procedures  . CMP14+EGFR  . CBC with Differential/Platelet  . Lipid panel  . TSH  . Uric Acid    Meds ordered this encounter  Medications  . allopurinol (ZYLOPRIM) 300 MG tablet    Sig: Take 1 tablet (300 mg total) by mouth daily.    Dispense:  90 tablet    Refill:  3  . amLODipine (NORVASC) 10 MG tablet    Sig: Take 1 tablet (10 mg total) by mouth daily.    Dispense:  90 tablet    Refill:  3  . atorvastatin (LIPITOR) 80 MG tablet    Sig: Take 1 tablet (80 mg total) by mouth daily. Needs an appointment for further refills    Dispense:  90 tablet    Refill:  3  . furosemide (LASIX) 40 MG tablet    Sig: Take 1 tablet (40 mg total) by mouth daily.    Dispense:  90 tablet    Refill:  3  . potassium  chloride SA (KLOR-CON M20) 20 MEQ tablet    Sig: Take 1 tablet (20 mEq total) by mouth 2 (two) times daily.    Dispense:  180 tablet    Refill:  3  . rivaroxaban (XARELTO) 20 MG TABS tablet    Sig: Take 1 tablet (20 mg total) by mouth daily with supper.    Dispense:  90 tablet    Refill:  [place], MD [facility] 03/19/2017, 8:21 AM

## 2017-03-20 LAB — CBC WITH DIFFERENTIAL/PLATELET
BASOS ABS: 0 10*3/uL (ref 0.0–0.2)
Basos: 0 %
EOS (ABSOLUTE): 0.2 10*3/uL (ref 0.0–0.4)
EOS: 4 %
HEMATOCRIT: 40.3 % (ref 37.5–51.0)
HEMOGLOBIN: 13.1 g/dL (ref 13.0–17.7)
IMMATURE GRANULOCYTES: 0 %
Immature Grans (Abs): 0 10*3/uL (ref 0.0–0.1)
LYMPHS ABS: 1.8 10*3/uL (ref 0.7–3.1)
Lymphs: 34 %
MCH: 29.5 pg (ref 26.6–33.0)
MCHC: 32.5 g/dL (ref 31.5–35.7)
MCV: 91 fL (ref 79–97)
MONOCYTES: 10 %
Monocytes Absolute: 0.5 10*3/uL (ref 0.1–0.9)
Neutrophils Absolute: 2.9 10*3/uL (ref 1.4–7.0)
Neutrophils: 52 %
Platelets: 180 10*3/uL (ref 150–379)
RBC: 4.44 x10E6/uL (ref 4.14–5.80)
RDW: 14.4 % (ref 12.3–15.4)
WBC: 5.5 10*3/uL (ref 3.4–10.8)

## 2017-03-20 LAB — CMP14+EGFR
ALK PHOS: 102 IU/L (ref 39–117)
ALT: 13 IU/L (ref 0–44)
AST: 13 IU/L (ref 0–40)
Albumin/Globulin Ratio: 1.5 (ref 1.2–2.2)
Albumin: 4 g/dL (ref 3.5–4.7)
BILIRUBIN TOTAL: 0.7 mg/dL (ref 0.0–1.2)
BUN / CREAT RATIO: 10 (ref 10–24)
BUN: 11 mg/dL (ref 8–27)
CHLORIDE: 101 mmol/L (ref 96–106)
CO2: 24 mmol/L (ref 20–29)
CREATININE: 1.06 mg/dL (ref 0.76–1.27)
Calcium: 9.1 mg/dL (ref 8.6–10.2)
GFR calc Af Amer: 75 mL/min/{1.73_m2} (ref >59)
GFR calc non Af Amer: 65 mL/min/{1.73_m2} (ref >59)
GLUCOSE: 88 mg/dL (ref 65–99)
Globulin, Total: 2.7 g/dL (ref 1.5–4.5)
Potassium: 3.9 mmol/L (ref 3.5–5.2)
Sodium: 140 mmol/L (ref 134–144)
Total Protein: 6.7 g/dL (ref 6.0–8.5)

## 2017-03-20 LAB — LIPID PANEL
CHOLESTEROL TOTAL: 145 mg/dL (ref 100–199)
Chol/HDL Ratio: 3.8 ratio (ref 0.0–5.0)
HDL: 38 mg/dL — AB (ref >39)
LDL CALC: 75 mg/dL (ref 0–99)
TRIGLYCERIDES: 161 mg/dL — AB (ref 0–149)
VLDL Cholesterol Cal: 32 mg/dL (ref 5–40)

## 2017-03-20 LAB — URIC ACID: Uric Acid: 4.9 mg/dL (ref 3.7–8.6)

## 2017-03-20 LAB — TSH: TSH: 7.88 u[IU]/mL — ABNORMAL HIGH (ref 0.450–4.500)

## 2017-03-20 NOTE — Addendum Note (Signed)
Addended by: Nigel Berthold C on: 03/20/2017 10:29 AM   Modules accepted: Orders

## 2017-04-07 ENCOUNTER — Other Ambulatory Visit: Payer: Self-pay | Admitting: Cardiology

## 2017-04-25 ENCOUNTER — Other Ambulatory Visit: Payer: PPO

## 2017-04-25 DIAGNOSIS — R7989 Other specified abnormal findings of blood chemistry: Secondary | ICD-10-CM

## 2017-04-25 DIAGNOSIS — R946 Abnormal results of thyroid function studies: Secondary | ICD-10-CM | POA: Diagnosis not present

## 2017-04-26 LAB — THYROID PANEL WITH TSH
Free Thyroxine Index: 1.4 (ref 1.2–4.9)
T3 UPTAKE RATIO: 28 % (ref 24–39)
T4, Total: 5 ug/dL (ref 4.5–12.0)
TSH: 5.26 u[IU]/mL — AB (ref 0.450–4.500)

## 2018-01-06 ENCOUNTER — Encounter: Payer: Self-pay | Admitting: Family

## 2018-01-06 ENCOUNTER — Ambulatory Visit (INDEPENDENT_AMBULATORY_CARE_PROVIDER_SITE_OTHER): Payer: PPO | Admitting: Family

## 2018-01-06 VITALS — BP 129/60 | HR 71 | Temp 97.5°F | Ht 71.0 in | Wt 261.6 lb

## 2018-01-06 DIAGNOSIS — L259 Unspecified contact dermatitis, unspecified cause: Secondary | ICD-10-CM | POA: Diagnosis not present

## 2018-01-06 DIAGNOSIS — I739 Peripheral vascular disease, unspecified: Secondary | ICD-10-CM | POA: Diagnosis not present

## 2018-01-06 DIAGNOSIS — B9689 Other specified bacterial agents as the cause of diseases classified elsewhere: Secondary | ICD-10-CM

## 2018-01-06 DIAGNOSIS — R609 Edema, unspecified: Secondary | ICD-10-CM

## 2018-01-06 DIAGNOSIS — L089 Local infection of the skin and subcutaneous tissue, unspecified: Secondary | ICD-10-CM

## 2018-01-06 MED ORDER — TRIAMCINOLONE ACETONIDE 0.5 % EX OINT: 1.0000 "application " | TOPICAL_OINTMENT | Freq: Two times a day (BID) | CUTANEOUS | 0 refills | Status: DC

## 2018-01-06 MED ORDER — DOXYCYCLINE HYCLATE 100 MG PO TABS: 100.0000 mg | ORAL_TABLET | Freq: Two times a day (BID) | ORAL | 0 refills | Status: DC

## 2018-01-06 NOTE — Patient Instructions (Signed)

## 2018-01-06 NOTE — Progress Notes (Signed)
   Subjective:    Patient ID: Marcus [name], male    DOB: 02-Oct-1932, 84 y.o.   MRN: [id number]  Chief Complaint  Patient presents with  . itching rash   He reports he "scratched a scab" off on his right lower ankle about 6 weeks ago. He reports is has not healed since and is erythemas, and swollen Rash  This is a new problem. The current episode started in the past 7 days. The problem is unchanged. The affected locations include the left arm. The rash is characterized by itchiness and redness. He was exposed to plant contact. Pertinent negatives include no congestion, cough, diarrhea, fatigue, fever, shortness of breath or sore throat. Past treatments include anti-itch cream. The treatment provided mild relief.      Review of Systems  Constitutional: Negative for fatigue and fever.  HENT: Negative for congestion and sore throat.   Respiratory: Negative for cough and shortness of breath.   Gastrointestinal: Negative for diarrhea.  Skin: Positive for rash.  All other systems reviewed and are negative.      Objective:   Physical Exam  Constitutional: He is oriented to person, place, and time. He appears well-developed and well-nourished. No distress.  HENT:  Head: Normocephalic.  Right Ear: External ear normal.  Left Ear: External ear normal.  Mouth/Throat: Oropharynx is clear and moist.  Eyes: Pupils are equal, round, and reactive to light. Right eye exhibits no discharge. Left eye exhibits no discharge.  Neck: Normal range of motion. Neck supple. No thyromegaly present.  Cardiovascular: Normal rate, regular rhythm, normal heart sounds and intact distal pulses.  No murmur heard. Pulmonary/Chest: Effort normal and breath sounds normal. No respiratory distress. He has no wheezes.  Abdominal: Soft. Bowel sounds are normal. He exhibits no distension. There is no tenderness.  Musculoskeletal: Normal range of motion. He exhibits tenderness. He exhibits no edema.  Neurological: He is  alert and oriented to person, place, and time. He has normal reflexes. No cranial nerve deficit.  Skin: Skin is warm and dry. Rash noted. There is erythema.  Left arm generalized erythemas and pruritic, right medial ankle erythemas approx 10cmX5.5cm   Ecchymosis on right lower leg  Psychiatric: He has a normal mood and affect. His behavior is normal. Judgment and thought content normal.  Vitals reviewed.     BP 129/60   Pulse 71   Temp (!) 97.5 F (36.4 C) (Oral)   Ht 5\' 11"  (1.803 m)   Wt 261 lb 9.6 oz (118.7 kg)   BMI 36.49 kg/m      Assessment & Plan:  Dax was seen today for itching rash.  Diagnoses and all orders for this visit:  Contact dermatitis, unspecified contact dermatitis type, unspecified trigger -     triamcinolone ointment (KENALOG) 0.5 %; Apply 1 application topically 2 (two) times daily.  Peripheral edema -     Compression stockings  PVD (peripheral vascular disease) (HCC) -     Compression stockings  Skin infection, bacterial -     doxycycline (VIBRA-TABS) 100 MG tablet; Take 1 tablet (100 mg total) by mouth 2 (two) times daily.    Compression hose for edema and continue Lasix PT has some vascular issues, but with redness and warmth will treat with doxycycline  Keep feet elevated when possible Kenalog cream for dermatitis rash on arm, do not scratch Keep follow up with PCP   Evelina Dun, FNP

## 2018-01-07 ENCOUNTER — Ambulatory Visit: Payer: PPO | Admitting: Family Medicine

## 2018-01-30 ENCOUNTER — Encounter: Payer: Self-pay | Admitting: Family Medicine

## 2018-01-30 ENCOUNTER — Ambulatory Visit (INDEPENDENT_AMBULATORY_CARE_PROVIDER_SITE_OTHER): Payer: PPO

## 2018-01-30 ENCOUNTER — Ambulatory Visit (INDEPENDENT_AMBULATORY_CARE_PROVIDER_SITE_OTHER): Payer: PPO | Admitting: Family Medicine

## 2018-01-30 VITALS — BP 132/64 | HR 59 | Temp 97.5°F | Ht 71.0 in | Wt 259.0 lb

## 2018-01-30 DIAGNOSIS — M7989 Other specified soft tissue disorders: Secondary | ICD-10-CM | POA: Diagnosis not present

## 2018-01-30 DIAGNOSIS — M79661 Pain in right lower leg: Secondary | ICD-10-CM | POA: Diagnosis not present

## 2018-01-30 DIAGNOSIS — S8991XA Unspecified injury of right lower leg, initial encounter: Secondary | ICD-10-CM | POA: Diagnosis not present

## 2018-01-30 DIAGNOSIS — I10 Essential (primary) hypertension: Secondary | ICD-10-CM

## 2018-01-30 DIAGNOSIS — I480 Paroxysmal atrial fibrillation: Secondary | ICD-10-CM

## 2018-01-30 DIAGNOSIS — I872 Venous insufficiency (chronic) (peripheral): Secondary | ICD-10-CM

## 2018-01-30 MED ORDER — TRIAMCINOLONE ACETONIDE 0.025 % EX CREA: 1.0000 "application " | TOPICAL_CREAM | Freq: Two times a day (BID) | CUTANEOUS | 0 refills | Status: DC

## 2018-01-30 NOTE — Patient Instructions (Signed)
Great to see you!   

## 2018-01-30 NOTE — Progress Notes (Signed)
   HPI  Patient presents today with leg swelling.  Patient explains that about 15 years ago he had a tree fall in his right leg causing intermittent swelling since that time.  At times he has diffuse lower right lower extremity swelling.  About 3 weeks ago he had another tree fall on his right leg and has had some increase in worsening of his swelling.  Patient takes Xarelto for atrial fibrillation, he denies any bleeding. He did have more bruising and swelling which has improved.  Patient states that he has not tried compression stockings yet.   PMH: Smoking status noted ROS: Per HPI  Objective: BP 132/64   Pulse (!) 59   Temp (!) 97.5 F (36.4 C) (Oral)   Ht '5\' 11"'$  (1.803 m)   Wt 259 lb (117.5 kg)   BMI 36.12 kg/m  Gen: NAD, alert, cooperative with exam HEENT: NCAT CV: RRR, good S1/S2, no murmur Resp: CTABL, no wheezes, non-labored Ext: Calf circumference symmetric, right lower extremity with localized swelling over the proximal tibia, no significant tenderness to palpation, trace to 1+ pitting edema bilateral lower extremities with some areas of erythema consistent with venous stasis dermatitis Neuro: Alert and oriented, No gross deficits  Assessment and plan:  #Lower extremity injury Plain film pending, considering that he is been walking on for 3 weeks is unlikely that there is a fracture, however I do recommend ruling this out formally  #Venous stasis dermatitis Kenalog cream given Recommended trying the compression stockings  # Hypertension Well-controlled No changes  #Atrial fibrillation Rate controlled, on Xarelto, CBC    Orders Placed This Encounter  Procedures  . DG Tibia/Fibula Right    Standing Status:   Future    Standing Expiration Date:   04/02/2019    Order Specific Question:   Reason for Exam (SYMPTOM  OR DIAGNOSIS REQUIRED)    Answer:   tree fell on RLE, swelling and pain    Order Specific Question:   Preferred imaging location?    Answer:    Internal    Order Specific Question:   Radiology Contrast Protocol - do NOT remove file path    Answer:   [email or web address]  . CBC with Differential/Platelet  . CMP14+EGFR  . Lipid panel  . TSH    Meds ordered this encounter  Medications  . triamcinolone (KENALOG) 0.025 % cream    Sig: Apply 1 application topically 2 (two) times daily.    Dispense:  454 g    Refill:  0    Laroy Apple, MD [facility] 01/30/2018, 1:13 PM

## 2018-01-31 LAB — CMP14+EGFR
A/G RATIO: 1.6 (ref 1.2–2.2)
ALBUMIN: 4.1 g/dL (ref 3.5–4.7)
ALT: 9 IU/L (ref 0–44)
AST: 14 IU/L (ref 0–40)
Alkaline Phosphatase: 110 IU/L (ref 39–117)
BILIRUBIN TOTAL: 0.9 mg/dL (ref 0.0–1.2)
BUN / CREAT RATIO: 14 (ref 10–24)
BUN: 14 mg/dL (ref 8–27)
CALCIUM: 9 mg/dL (ref 8.6–10.2)
CHLORIDE: 103 mmol/L (ref 96–106)
CO2: 23 mmol/L (ref 20–29)
Creatinine, Ser: 1 mg/dL (ref 0.76–1.27)
GFR, EST AFRICAN AMERICAN: 80 mL/min/{1.73_m2} (ref >59)
GFR, EST NON AFRICAN AMERICAN: 69 mL/min/{1.73_m2} (ref >59)
GLUCOSE: 91 mg/dL (ref 65–99)
Globulin, Total: 2.5 g/dL (ref 1.5–4.5)
Potassium: 4.3 mmol/L (ref 3.5–5.2)
Sodium: 142 mmol/L (ref 134–144)
TOTAL PROTEIN: 6.6 g/dL (ref 6.0–8.5)

## 2018-01-31 LAB — LIPID PANEL
CHOL/HDL RATIO: 3.7 ratio (ref 0.0–5.0)
Cholesterol, Total: 144 mg/dL (ref 100–199)
HDL: 39 mg/dL — AB (ref >39)
LDL Calculated: 83 mg/dL (ref 0–99)
Triglycerides: 109 mg/dL (ref 0–149)
VLDL CHOLESTEROL CAL: 22 mg/dL (ref 5–40)

## 2018-01-31 LAB — CBC WITH DIFFERENTIAL/PLATELET
BASOS: 1 %
Basophils Absolute: 0 10*3/uL (ref 0.0–0.2)
EOS (ABSOLUTE): 0.3 10*3/uL (ref 0.0–0.4)
Eos: 6 %
HEMOGLOBIN: 12.8 g/dL — AB (ref 13.0–17.7)
Hematocrit: 39.3 % (ref 37.5–51.0)
IMMATURE GRANS (ABS): 0 10*3/uL (ref 0.0–0.1)
IMMATURE GRANULOCYTES: 0 %
LYMPHS: 30 %
Lymphocytes Absolute: 1.7 10*3/uL (ref 0.7–3.1)
MCH: 29.9 pg (ref 26.6–33.0)
MCHC: 32.6 g/dL (ref 31.5–35.7)
MCV: 92 fL (ref 79–97)
MONOCYTES: 11 %
Monocytes Absolute: 0.6 10*3/uL (ref 0.1–0.9)
NEUTROS ABS: 3 10*3/uL (ref 1.4–7.0)
NEUTROS PCT: 52 %
PLATELETS: 202 10*3/uL (ref 150–450)
RBC: 4.28 x10E6/uL (ref 4.14–5.80)
RDW: 14.7 % (ref 12.3–15.4)
WBC: 5.8 10*3/uL (ref 3.4–10.8)

## 2018-01-31 LAB — TSH: TSH: 4.3 u[IU]/mL (ref 0.450–4.500)

## 2018-03-13 ENCOUNTER — Other Ambulatory Visit: Payer: Self-pay | Admitting: Family Medicine

## 2018-03-24 DIAGNOSIS — N390 Urinary tract infection, site not specified: Secondary | ICD-10-CM | POA: Diagnosis not present

## 2018-03-24 DIAGNOSIS — N401 Enlarged prostate with lower urinary tract symptoms: Secondary | ICD-10-CM | POA: Diagnosis not present

## 2018-03-24 DIAGNOSIS — R351 Nocturia: Secondary | ICD-10-CM | POA: Diagnosis not present

## 2018-03-25 ENCOUNTER — Other Ambulatory Visit: Payer: Self-pay | Admitting: Family Medicine

## 2018-04-07 ENCOUNTER — Other Ambulatory Visit: Payer: Self-pay

## 2018-04-07 MED ORDER — ALLOPURINOL 300 MG PO TABS: 300.0000 mg | ORAL_TABLET | Freq: Every day | ORAL | 0 refills | Status: DC

## 2018-04-08 ENCOUNTER — Telehealth: Payer: Self-pay | Admitting: Family Medicine

## 2018-04-09 ENCOUNTER — Ambulatory Visit (INDEPENDENT_AMBULATORY_CARE_PROVIDER_SITE_OTHER): Payer: PPO | Admitting: Family Medicine

## 2018-04-09 ENCOUNTER — Encounter: Payer: Self-pay | Admitting: Family Medicine

## 2018-04-09 VITALS — BP 137/77 | HR 125 | Temp 98.0°F | Ht 71.0 in | Wt 258.0 lb

## 2018-04-09 DIAGNOSIS — R8279 Other abnormal findings on microbiological examination of urine: Secondary | ICD-10-CM

## 2018-04-09 MED ORDER — CEFTRIAXONE SODIUM 1 G IJ SOLR
1.0000 g | Freq: Once | INTRAMUSCULAR | Status: AC
  Administered 2018-04-09: 1 g via INTRAMUSCULAR

## 2018-04-09 NOTE — Progress Notes (Signed)
BP 137/77   Pulse (!) 125   Temp 98 F (36.7 C) (Oral)   Ht 5\' 11"  (1.803 m)   Wt 258 lb (117 kg)   BMI 35.98 kg/m    Subjective:    Patient ID: Marcus [name], male    DOB: October 04, 1932, 84 y.o.   MRN: [id number]  HPI: Marcus [name] is a 84 y.o. male presenting on 04/09/2018 for Allergic Reaction (Patient states that he was placed on Cipro by his urologist- Took sunday and broke out into hives Monday. Has d/c medication )   HPI Positive urine culture and drug reaction Patient is coming in today because he was seen by urology for his annual visit and had a urine culture which came back positive for pseudomonas aeruginosa.  They sent in ciprofloxacin for him and he took it one time and developed hives and itching and a rash all over his whole body.  This occurred 3 days ago and the rash is mostly resolved now but he has not taken the antibiotics since then.  He is coming today to see what he needs to do because of the urine culture.  He denies any fevers or chills or abdominal pain or dysuria or hematuria.  He says mainly the culture came back positive so they were treating that.  Relevant past medical, surgical, family and social history reviewed and updated as indicated. Interim medical history since our last visit reviewed. Allergies and medications reviewed and updated.  Review of Systems  Constitutional: Negative for chills and fever.  Respiratory: Negative for shortness of breath and wheezing.   Cardiovascular: Negative for chest pain and leg swelling.  Gastrointestinal: Negative for abdominal pain.  Genitourinary: Negative for decreased urine volume, difficulty urinating, dysuria and hematuria.  Musculoskeletal: Negative for back pain and gait problem.  Skin: Negative for rash.  All other systems reviewed and are negative.   Per HPI unless specifically indicated above   Allergies as of 04/09/2018   No Known Allergies     Medication List        Accurate as of 04/09/18   4:03 PM. Always use your most recent med list.          allopurinol 300 MG tablet Commonly known as:  ZYLOPRIM Take 1 tablet (300 mg total) by mouth daily.   amLODipine 10 MG tablet Commonly known as:  NORVASC TAKE 1 TABLET BY MOUTH EVERY DAY   atorvastatin 80 MG tablet Commonly known as:  LIPITOR Take 1 tablet (80 mg total) by mouth daily.   ciprofloxacin 500 MG tablet Commonly known as:  CIPRO Take 500 mg by mouth 2 (two) times daily.   doxazosin 4 MG tablet Commonly known as:  CARDURA Take 2 mg by mouth daily after supper.   ferrous gluconate 324 MG tablet Commonly known as:  FERGON Take 324 mg by mouth 2 (two) times daily with a meal.   furosemide 40 MG tablet Commonly known as:  LASIX TAKE 1 TABLET BY MOUTH EVERY DAY   KLOR-CON M20 20 MEQ tablet Generic drug:  potassium chloride SA TAKE 1 TABLET (20 MEQ TOTAL) BY MOUTH 2 (TWO) TIMES DAILY.   metoprolol tartrate 25 MG tablet Commonly known as:  LOPRESSOR TAKE 1 TABLET BY MOUTH TWICE A DAY   triamcinolone 0.025 % cream Commonly known as:  KENALOG Apply 1 application topically 2 (two) times daily.   XARELTO 20 MG Tabs tablet Generic drug:  rivaroxaban TAKE 1 TABLET (20 MG TOTAL) BY  MOUTH DAILY WITH SUPPER.          Objective:    BP 137/77   Pulse (!) 125   Temp 98 F (36.7 C) (Oral)   Ht 5\' 11"  (1.803 m)   Wt 258 lb (117 kg)   BMI 35.98 kg/m   Wt Readings from Last 3 Encounters:  04/09/18 258 lb (117 kg)  01/30/18 259 lb (117.5 kg)  01/06/18 261 lb 9.6 oz (118.7 kg)    Physical Exam  Constitutional: He is oriented to person, place, and time. He appears well-developed and well-nourished. No distress.  Eyes: Conjunctivae are normal. No scleral icterus.  Cardiovascular: Normal rate, regular rhythm, normal heart sounds and intact distal pulses.  No murmur heard. Pulmonary/Chest: Effort normal and breath sounds normal. No respiratory distress. He has no wheezes.  Abdominal: Soft. Bowel sounds are  normal. He exhibits no distension and no mass. There is no tenderness. There is no guarding.  Neurological: He is alert and oriented to person, place, and time. Coordination normal.  Skin: Skin is warm and dry. No rash noted. He is not diaphoretic.  Psychiatric: He has a normal mood and affect. His behavior is normal.  Nursing note and vitals reviewed.   Urine culture, positive gram-negative rods, pseudomonas aeruginosa with sensitivity to amikacin and ceftazidime and Cipro and cefepime and gentamicin and Levaquin and meropenem and Pipracil and/tazobactam    Assessment & Plan:   Problem List Items Addressed This Visit    None    Visit Diagnoses    Urine culture positive    -  Primary   Patient saw a urologist and had a positive urine culture, was sent Cipro, had hives with Cipro, will give Rocephin shot   Relevant Medications   cefTRIAXone (ROCEPHIN) injection 1 g (Start on 04/09/2018  4:15 PM)      Possible allergic reaction to Cipro, recommended to avoid in the future, will treat urine culture with Rocephin today.  Follow up plan: Return if symptoms worsen or fail to improve.  Counseling provided for all of the vaccine components No orders of the defined types were placed in this encounter.   Caryl Pina, MD [facility] 04/09/2018, 4:03 PM

## 2018-04-18 ENCOUNTER — Encounter: Payer: Self-pay | Admitting: Cardiology

## 2018-04-18 ENCOUNTER — Ambulatory Visit (INDEPENDENT_AMBULATORY_CARE_PROVIDER_SITE_OTHER): Payer: PPO | Admitting: Cardiology

## 2018-04-18 ENCOUNTER — Telehealth: Payer: Self-pay | Admitting: Family Medicine

## 2018-04-18 VITALS — BP 130/70 | HR 92 | Ht 71.0 in | Wt 259.0 lb

## 2018-04-18 DIAGNOSIS — E785 Hyperlipidemia, unspecified: Secondary | ICD-10-CM

## 2018-04-18 DIAGNOSIS — I481 Persistent atrial fibrillation: Secondary | ICD-10-CM | POA: Diagnosis not present

## 2018-04-18 DIAGNOSIS — I4819 Other persistent atrial fibrillation: Secondary | ICD-10-CM

## 2018-04-18 DIAGNOSIS — I2510 Atherosclerotic heart disease of native coronary artery without angina pectoris: Secondary | ICD-10-CM

## 2018-04-18 DIAGNOSIS — I10 Essential (primary) hypertension: Secondary | ICD-10-CM

## 2018-04-18 NOTE — Telephone Encounter (Signed)
Spoke with pt's daughter and she said where he went to get [employer] Physical they are requesting documentation from Cardiology. Advised pt's daughter she should call Dr Percival Spanish since he is the cardiologist that started him on the medication. Pt's daughter voiced understanding.

## 2018-04-18 NOTE — Telephone Encounter (Signed)
PT daughter is calling states that pt was seen somewhere for [employer] [facility] and that provider is requesting some type of documentation about the Xarelto that the pt is on, the daughter is wanting to know if we can do that

## 2018-04-18 NOTE — Progress Notes (Signed)
HPI The patient presents for followup of coronary disease status post CABG and atrial flutter. He has been cardioverted. Subsequent follow-up in the atrial fibrillation clinic is demonstrated normal sinus rhythm.  I have not seen him in the office since 2017.  Since I last saw him he has done well.  The patient denies any new symptoms such as chest discomfort, neck or arm discomfort. There has been no new shortness of breath, PND or orthopnea. There have been no reported palpitations, presyncope or syncope.  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    No Known Allergies  Current Outpatient Medications  Medication Sig Dispense Refill  . allopurinol (ZYLOPRIM) 300 MG tablet Take 1 tablet (300 mg total) by mouth daily. 90 tablet 0  . amLODipine (NORVASC) 10 MG tablet TAKE 1 TABLET BY MOUTH EVERY DAY 90 tablet 1  . atorvastatin (LIPITOR) 80 MG tablet Take 1 tablet (80 mg total) by mouth daily. 90 tablet 1  . ciprofloxacin (CIPRO) 500 MG tablet Take 500 mg by mouth 2 (two) times daily.  0  . doxazosin (CARDURA) 4 MG tablet Take 2 mg by mouth daily after supper.    . ferrous gluconate (FERGON) 324 MG tablet Take 324 mg by mouth 2 (two) times daily with a meal.    . furosemide (LASIX) 40 MG tablet TAKE 1 TABLET BY MOUTH EVERY DAY (Patient taking differently: Take 20 mg by mouth daily. ) 90 tablet 0  . KLOR-CON M20 20 MEQ tablet TAKE 1 TABLET (20 MEQ TOTAL) BY MOUTH 2 (TWO) TIMES DAILY. 180 tablet 0  . metoprolol tartrate (LOPRESSOR) 25 MG tablet TAKE 1 TABLET BY MOUTH TWICE A DAY (Patient taking differently: Take 12.5 mg by mouth 2 (two) times daily. ) 60 tablet 0  . triamcinolone (KENALOG) 0.025 % cream Apply 1 application topically 2 (two) times daily. 454 g 0  . XARELTO 20 MG TABS tablet TAKE 1 TABLET (20 MG TOTAL) BY MOUTH DAILY WITH SUPPER. 90 tablet 0   No current facility-administered medications for this visit.     Past Medical History:    Diagnosis Date  . Angina   . Arthritis   . Atrial flutter ([place])   . Chronic kidney disease    hx of BPH  . Coronary atherosclerosis of native coronary artery   . Gout   . Hypertension   . Myocardial infarction ([place])   . Other and unspecified hyperlipidemia   . Pure hypercholesterolemia     Past Surgical History:  Procedure Laterality Date  . CATARACTS    . CHEST TUBE INSERTION  11/22/2011   Procedure: CHEST TUBE INSERTION;  Surgeon: Ivin Poot, MD;  Location: [place];  Service: Thoracic;  Laterality: Right;  . CORONARY ARTERY BYPASS GRAFT  11/20/2011   Procedure: CORONARY ARTERY BYPASS GRAFTING (CABG);  Surgeon: Ivin Poot, MD;  Location: [place];  Service: Open Heart Surgery;  Laterality: N/A;  Times 3. On Pump. Using endoscopically harvested right greater saphenous vein and left internal mammary artery.   Marland Kitchen ELECTROPHYSIOLOGIC STUDY N/A 06/27/2015   Procedure: Cardioversion;  Surgeon: Deboraha Sprang, MD;  Location: [facility];  Service: Cardiovascular;  Laterality: N/A;  . LEFT AND RIGHT HEART CATHETERIZATION WITH CORONARY ANGIOGRAM N/A 11/16/2011   Procedure: LEFT AND RIGHT HEART CATHETERIZATION WITH CORONARY ANGIOGRAM;  Surgeon: Sherren Mocha, MD;  Location: [facility] CATH LAB;  Service: Cardiovascular;  Laterality: N/A;  . MAZE  11/20/2011   Procedure: MAZE;  Surgeon: Ivin Poot, MD;  Location: [place];  Service: Open Heart Surgery;  Laterality: N/A;    ROS:  As stated in the HPI and negative for all other systems.  PHYSICAL EXAM BP 130/70 (BP Location: Left Arm, Patient Position: Sitting, Cuff Size: Large)   Pulse 92   Ht 5\' 11"  (1.803 m)   Wt 259 lb (117.5 kg)   BMI 36.12 kg/m   GENERAL:  Well appearing  NECK:  No jugular venous distention, waveform within normal limits, carotid upstroke brisk and symmetric, no bruits, no thyromegaly LUNGS:  Clear to auscultation bilaterally CHEST:  Unremarkable HEART:  PMI not displaced or sustained,S1 and S2 within normal  limits, no S3, no clicks, no rubs, no murmurs, irregular ABD:  Flat, positive bowel sounds normal in frequency in pitch, no bruits, no rebound, no guarding, no midline pulsatile mass, no hepatomegaly, no splenomegaly EXT:  2 plus pulses throughout, no edema, no cyanosis no clubbing   EKG:  Atrial fib, rate 92, left axis deviation, old anteroseptal infarct, NSST, left axis deviaiton.   Lab Results  Component Value Date   CHOL 144 01/30/2018   TRIG 109 01/30/2018   HDL 39 (L) 01/30/2018   LDLCALC 83 01/30/2018    ASSESSMENT AND PLAN  CAD:  The patient has no new sypmtoms.  No further cardiovascular testing is indicated.  We will continue with aggressive risk reduction and meds as listed.  ATRIAL FLUTTER:    [name] has a CHA2DS2 - VASc score of 4 with a risk of stroke of 4%.  He is back in flutter atypical but he absolutely does not feel this.  There is no indication to cardiovert.  No change I therapy.  I will place a Holter to maker sure that he has good rate control.   HTN:  The blood pressure is at target. No change in medications is indicated. We will continue with therapeutic lifestyle changes (TLC).  DYSLIPIDEMIA:  His lipids are excellent.  Continue current therapy.   DOT:  He has no contraindication to DOT clearance.

## 2018-04-18 NOTE — Patient Instructions (Signed)
Medication Instructions:  Continue current medications  If you need a refill on your cardiac medications before your next appointment, please call your pharmacy.  Labwork: None Ordered   Testing/Procedures: Your physician has recommended that you wear a holter monitor. Holter monitors are medical devices that record the heart's electrical activity. Doctors most often use these monitors to diagnose arrhythmias. Arrhythmias are problems with the speed or rhythm of the heartbeat. The monitor is a [employer]rammer, portable device. You can wear one while you do your normal daily activities. This is usually used to diagnose what is causing palpitations/syncope (passing out).  Follow-Up: Your physician wants you to follow-up in: 1 Year. You should receive a reminder letter in the mail two months in advance. If you do not receive a letter, please call our office in [phone] to schedule your follow-up appointment.   Thank you for choosing [facility]!!

## 2018-04-19 ENCOUNTER — Other Ambulatory Visit: Payer: Self-pay | Admitting: Cardiology

## 2018-06-17 ENCOUNTER — Other Ambulatory Visit: Payer: Self-pay

## 2018-06-17 MED ORDER — POTASSIUM CHLORIDE CRYS ER 20 MEQ PO TBCR: EXTENDED_RELEASE_TABLET | ORAL | 0 refills | Status: DC

## 2018-06-17 MED ORDER — RIVAROXABAN 20 MG PO TABS: ORAL_TABLET | ORAL | 0 refills | Status: DC

## 2018-06-17 MED ORDER — FUROSEMIDE 40 MG PO TABS: 40.0000 mg | ORAL_TABLET | Freq: Every day | ORAL | 0 refills | Status: DC

## 2018-07-02 ENCOUNTER — Other Ambulatory Visit: Payer: Self-pay | Admitting: Family Medicine

## 2018-07-22 ENCOUNTER — Other Ambulatory Visit: Payer: Self-pay

## 2018-07-22 ENCOUNTER — Emergency Department (HOSPITAL_COMMUNITY)
Admission: EM | Admit: 2018-07-22 | Discharge: 2018-07-22 | Disposition: A | Discharge status: Home / Self Care | Payer: PPO | Attending: Emergency Medicine | Admitting: Emergency Medicine

## 2018-07-22 ENCOUNTER — Emergency Department (HOSPITAL_COMMUNITY): Payer: PPO

## 2018-07-22 ENCOUNTER — Encounter (HOSPITAL_COMMUNITY): Payer: Self-pay | Admitting: Oncology

## 2018-07-22 DIAGNOSIS — I252 Old myocardial infarction: Secondary | ICD-10-CM | POA: Insufficient documentation

## 2018-07-22 DIAGNOSIS — R079 Chest pain, unspecified: Secondary | ICD-10-CM | POA: Diagnosis not present

## 2018-07-22 DIAGNOSIS — Z79899 Other long term (current) drug therapy: Secondary | ICD-10-CM | POA: Diagnosis not present

## 2018-07-22 DIAGNOSIS — I129 Hypertensive chronic kidney disease with stage 1 through stage 4 chronic kidney disease, or unspecified chronic kidney disease: Secondary | ICD-10-CM | POA: Diagnosis not present

## 2018-07-22 DIAGNOSIS — Z7901 Long term (current) use of anticoagulants: Secondary | ICD-10-CM | POA: Insufficient documentation

## 2018-07-22 DIAGNOSIS — Z951 Presence of aortocoronary bypass graft: Secondary | ICD-10-CM | POA: Diagnosis not present

## 2018-07-22 DIAGNOSIS — R072 Precordial pain: Secondary | ICD-10-CM | POA: Insufficient documentation

## 2018-07-22 DIAGNOSIS — N189 Chronic kidney disease, unspecified: Secondary | ICD-10-CM | POA: Diagnosis not present

## 2018-07-22 DIAGNOSIS — I482 Chronic atrial fibrillation, unspecified: Secondary | ICD-10-CM | POA: Insufficient documentation

## 2018-07-22 DIAGNOSIS — I2510 Atherosclerotic heart disease of native coronary artery without angina pectoris: Secondary | ICD-10-CM | POA: Diagnosis not present

## 2018-07-22 LAB — CBC WITH DIFFERENTIAL/PLATELET
Abs Immature Granulocytes: 0.01 10*3/uL (ref 0.00–0.07)
BASOS PCT: 1 %
Basophils Absolute: 0 10*3/uL (ref 0.0–0.1)
EOS PCT: 4 %
Eosinophils Absolute: 0.2 10*3/uL (ref 0.0–0.5)
HCT: 43.4 % (ref 39.0–52.0)
Hemoglobin: 14.1 g/dL (ref 13.0–17.0)
Immature Granulocytes: 0 %
LYMPHS PCT: 30 %
Lymphs Abs: 1.7 10*3/uL (ref 0.7–4.0)
MCH: 29.6 pg (ref 26.0–34.0)
MCHC: 32.5 g/dL (ref 30.0–36.0)
MCV: 91 fL (ref 80.0–100.0)
MONO ABS: 0.6 10*3/uL (ref 0.1–1.0)
Monocytes Relative: 10 %
Neutro Abs: 3.2 10*3/uL (ref 1.7–7.7)
Neutrophils Relative %: 55 %
Platelets: 188 10*3/uL (ref 150–400)
RBC: 4.77 MIL/uL (ref 4.22–5.81)
RDW: 13.2 % (ref 11.5–15.5)
WBC: 5.7 10*3/uL (ref 4.0–10.5)
nRBC: 0 % (ref 0.0–0.2)

## 2018-07-22 LAB — BASIC METABOLIC PANEL
Anion gap: 9 (ref 5–15)
BUN: 11 mg/dL (ref 8–23)
CO2: 27 mmol/L (ref 22–32)
CREATININE: 1.12 mg/dL (ref 0.61–1.24)
Calcium: 9.1 mg/dL (ref 8.9–10.3)
Chloride: 102 mmol/L (ref 98–111)
GFR calc Af Amer: >60 mL/min (ref >60)
GFR, EST NON AFRICAN AMERICAN: 59 mL/min — AB (ref >60)
Glucose, Bld: 97 mg/dL (ref 70–99)
Potassium: 3.7 mmol/L (ref 3.5–5.1)
SODIUM: 138 mmol/L (ref 135–145)

## 2018-07-22 LAB — I-STAT TROPONIN, ED
TROPONIN I, POC: 0 ng/mL (ref 0.00–0.08)
Troponin i, poc: 0 ng/mL (ref 0.00–0.08)

## 2018-07-22 MED ORDER — METOPROLOL TARTRATE 25 MG PO TABS
25.0000 mg | ORAL_TABLET | Freq: Once | ORAL | Status: AC
  Administered 2018-07-22: 25 mg via ORAL
  Filled 2018-07-22: qty 1

## 2018-07-22 NOTE — ED Notes (Signed)
Patient verbalizes understanding of discharge instructions. Opportunity for questioning and answers were provided. Armband removed by staff, pt discharged from ED.  

## 2018-07-22 NOTE — ED Provider Notes (Signed)
Arrived afib/aflutter which is chronic with c/o "pinching" sensation for 1 second at a time in chest. None since in ED.  HR improving to 90s now before intervention. Getting AM metoprolol dose. Plan to observe for rate control and anticipate d/c. Patient has good f/u and family support. Physical Exam  BP 110/83   Pulse 87   Temp 97.9 F (36.6 C) (Oral)   Resp 16   Ht 5\' 11"  (1.803 m)   Wt 116.6 kg   SpO2 94%   BMI 35.84 kg/m   Physical Exam  ED Course/Procedures     Procedures  MDM  Patient has remained stable.  Heart rate is in the 70s and well rate controlled.  Patient is compliant with his Eliquis.  He has good follow-up established.  He is asymptomatic.  Return precautions reviewed.      Charlesetta Shanks, MD 07/22/18 1046

## 2018-07-22 NOTE — ED Triage Notes (Signed)
Pt c/o left sided intermittent CP x 2 days. Denies radiation of pain or any other associated sx.  Pt w/ hx of CABG in 2013.

## 2018-07-22 NOTE — ED Provider Notes (Signed)
[facility] EMERGENCY DEPARTMENT Provider Note   CSN: [id number] Arrival date & time: 07/22/18  8250     History   Chief Complaint Chief Complaint  Patient presents with  . Chest Pain    HPI Marcus [name] is a 84 y.o. male.  The history is provided by the patient and a relative.  Chest Pain   This is a new problem. The current episode started 2 days ago. The problem has not changed since onset.Pain location: left chest. The pain is mild. Quality: pinching. The pain does not radiate. Pertinent negatives include no abdominal pain, no diaphoresis, no palpitations, no shortness of breath, no syncope and no vomiting. He has tried nothing for the symptoms. Risk factors include being elderly.  His past medical history is significant for CAD.  Patient with known history of CAD, previous bypass surgery, history of atrial flutter on Xarelto presents with chest pain.  He reports his been having a "pinch" in the left chest over the past 2 days.  He reports the pain lasts for a few seconds and resolves.  He denies any associated symptoms.  He denies fever/vomiting/shortness of breath.  No diaphoresis.  No palpitations.  Past Medical History:  Diagnosis Date  . Angina   . Arthritis   . Atrial flutter ([place])   . Chronic kidney disease    hx of BPH  . Coronary atherosclerosis of native coronary artery   . Gout   . Hypertension   . Myocardial infarction ([place])   . Other and unspecified hyperlipidemia   . Pure hypercholesterolemia     Patient Active Problem List   Diagnosis Date Noted  . Prostatitis 06/30/2015  . Atrial flutter ([place]) 06/25/2015  . AV block 06/25/2015  . Healthcare maintenance 06/06/2015  . Edema 02/19/2012  . Melena 02/19/2012  . Dizziness 12/25/2011  . Status post coronary artery bypass grafting 12/25/2011  . S/P Maze operation for atrial fibrillation 12/25/2011  . HTN (hypertension) 11/15/2011  . Paroxysmal atrial fibrillation ([place]) 11/15/2011  .  Pure hypercholesterolemia 10/18/2009  . CORONARY ATHEROSCLEROSIS NATIVE CORONARY ARTERY 10/18/2009    Past Surgical History:  Procedure Laterality Date  . CATARACTS    . CHEST TUBE INSERTION  11/22/2011   Procedure: CHEST TUBE INSERTION;  Surgeon: Ivin Poot, MD;  Location: [place] [place] [facility] [place];  Service: Thoracic;  Laterality: Right;  . CORONARY ARTERY BYPASS GRAFT  11/20/2011   Procedure: CORONARY ARTERY BYPASS GRAFTING (CABG);  Surgeon: Ivin Poot, MD;  Location: [place];  Service: Open Heart Surgery;  Laterality: N/A;  Times 3. On Pump. Using endoscopically harvested right greater saphenous vein and left internal mammary artery.   Marland Kitchen ELECTROPHYSIOLOGIC STUDY N/A 06/27/2015   Procedure: Cardioversion;  Surgeon: Deboraha Sprang, MD;  Location: [place] [facility];  Service: Cardiovascular;  Laterality: N/A;  . LEFT AND RIGHT HEART CATHETERIZATION WITH CORONARY ANGIOGRAM N/A 11/16/2011   Procedure: LEFT AND RIGHT HEART CATHETERIZATION WITH CORONARY ANGIOGRAM;  Surgeon: Sherren Mocha, MD;  Location: [facility] CATH LAB;  Service: Cardiovascular;  Laterality: N/A;  . MAZE  11/20/2011   Procedure: MAZE;  Surgeon: Ivin Poot, MD;  Location: [place];  Service: Open Heart Surgery;  Laterality: N/A;        Home Medications    Prior to Admission medications   Medication Sig Start Date End Date Taking? Authorizing Provider  allopurinol (ZYLOPRIM) 300 MG tablet TAKE 1 TABLET BY MOUTH EVERY DAY 07/02/18   Eustaquio Maize, MD  amLODipine (  NORVASC) 10 MG tablet TAKE 1 TABLET BY MOUTH EVERY DAY 03/26/18   Dettinger, Fransisca Kaufmann, MD  atorvastatin (LIPITOR) 80 MG tablet Take 1 tablet (80 mg total) by mouth daily. 03/26/18   Dettinger, Fransisca Kaufmann, MD  ciprofloxacin (CIPRO) 500 MG tablet Take 500 mg by mouth 2 (two) times daily. 03/31/18   [provider]  doxazosin (CARDURA) 4 MG tablet Take 2 mg by mouth daily after supper.    [provider]  ferrous gluconate (FERGON) 324 MG tablet Take 324 mg by mouth 2  (two) times daily with a meal.    [provider]  furosemide (LASIX) 40 MG tablet Take 1 tablet (40 mg total) by mouth daily. 06/17/18   Chipper Herb, MD  metoprolol tartrate (LOPRESSOR) 25 MG tablet TAKE 1 TABLET BY MOUTH TWICE A DAY Patient taking differently: Take 12.5 mg by mouth 2 (two) times daily.  04/08/17   Minus Breeding, MD  metoprolol tartrate (LOPRESSOR) 25 MG tablet TAKE 1 TABLET TWICE A DAY 04/21/18   Minus Breeding, MD  potassium chloride SA (KLOR-CON M20) 20 MEQ tablet TAKE 1 TABLET (20 MEQ TOTAL) BY MOUTH 2 (TWO) TIMES DAILY. 06/17/18   Chipper Herb, MD  rivaroxaban (XARELTO) 20 MG TABS tablet TAKE 1 TABLET (20 MG TOTAL) BY MOUTH DAILY WITH SUPPER. 06/17/18   Chipper Herb, MD  triamcinolone (KENALOG) 0.025 % cream Apply 1 application topically 2 (two) times daily. 01/30/18   Timmothy Euler, MD    Family History Family History  Problem Relation Age of Onset  . Colon cancer Son     Social History Social History   Tobacco Use  . Smoking status: Never Smoker  . Smokeless tobacco: Never Used  Substance Use Topics  . Alcohol use: Yes    Comment: Occasional   . Drug use: No     Allergies   Patient has no known allergies.   Review of Systems Review of Systems  Constitutional: Negative for diaphoresis.  Respiratory: Negative for shortness of breath.   Cardiovascular: Positive for chest pain. Negative for palpitations and syncope.  Gastrointestinal: Negative for abdominal pain and vomiting.  All other systems reviewed and are negative.    Physical Exam Updated Vital Signs BP 134/89 (BP Location: Right Arm)   Pulse (!) 135   Temp 97.9 F (36.6 C) (Oral)   Resp 20   Ht 1.803 m (5\' 11" )   Wt 116.6 kg   SpO2 95%   BMI 35.84 kg/m   Physical Exam CONSTITUTIONAL: Elderly, well-developed, no acute distress HEAD: Normocephalic/atraumatic EYES: EOMI/PERRL ENMT: Mucous membranes moist NECK: supple no meningeal signs SPINE/BACK:entire  spine nontender CV: Tachycardic, no loud murmur LUNGS: Lungs are clear to auscultation bilaterally, no apparent distress ABDOMEN: soft, nontender, no rebound or guarding, bowel sounds noted throughout abdomen GU:no cva tenderness NEURO: Pt is awake/alert/appropriate, moves all extremitiesx4.  No facial droop.   EXTREMITIES: pulses normal/equal, full ROM SKIN: warm, color normal PSYCH: no abnormalities of mood noted, alert and oriented to situation   ED Treatments / Results  Labs (all labs ordered are listed, but only abnormal results are displayed) Labs Reviewed  BASIC METABOLIC PANEL - Abnormal; Notable for the following components:      Result Value   GFR calc non Af Amer 59 (*)    All other components within normal limits  CBC WITH DIFFERENTIAL/PLATELET  I-STAT TROPONIN, ED    EKG EKG Interpretation  Date/Time:  Tuesday July 22 2018 06:23:14 EST  Ventricular Rate:  101 PR Interval:  114 QRS Duration: 93 QT Interval:  416 QTC Calculation: 540 R Axis:   -66 Text Interpretation:  Atrial flutter Left anterior fascicular block Consider anterior infarct Repol abnrm, severe global ischemia (LM/MVD) Prolonged QT interval Confirmed by Ripley Fraise [phone]) on 07/22/2018 6:43:09 AM   Radiology Dg Chest 2 View  Result Date: 07/22/2018 CLINICAL DATA:  Left-sided chest pain for 2 days. EXAM: CHEST - 2 VIEW COMPARISON:  Radiograph 12/17/2011 FINDINGS: Post median sternotomy and CABG. Chronic elevation of right hemidiaphragm with adjacent atelectasis or scarring. Unchanged heart size and mediastinal contours with aortic atherosclerosis. No pulmonary edema, focal airspace disease, pleural effusion or pneumothorax. Multilevel degenerative change in the spine. Degenerative change of both shoulders. IMPRESSION: 1. No acute findings. 2. Chronic elevation of right hemidiaphragm with adjacent atelectasis/scarring. Post CABG. Electronically Signed   By: Keith Rake M.D.   On:  07/22/2018 06:40    Procedures Procedures   Medications Ordered in ED Medications  metoprolol tartrate (LOPRESSOR) tablet 25 mg (has no administration in time range)     Initial Impression / Assessment and Plan / ED Course  I have reviewed the triage vital signs and the nursing notes.  Pertinent labs  results that were available during my care of the patient were reviewed by me and considered in my medical decision making (see chart for details).     6:52 AM Patient presents for what he reports is just pinching.  He is asymptomatic at this time.  He has no other complaints.  He does appear to be having runs of atrial flutter with RVR.  He was unaware of this.  Per previous cardiology notes, he likely has chronic atrial flutter.  He takes Xarelto daily.  The pinching type pain could be related to the tachycardia.  Labs are pending at this time Likely would not benefit from cardioversion 7:42 AM Patient has had no further pain.  Suspicion for ACS/PE is low.  He is very well-appearing.  Suspect his symptoms may be due to his uncontrolled a-flutter.  His heart rate is now improving.  Plan to give him 1 dose of Lopressor here monitor for the next hour and reassess.  If heart rate remains controlled and no further pain episodes, he can be discharged home with close cardiology follow-up I discussed the plan extensively with patient and his family and they agree with plan Signed out to dr Johnney Killian to f/u on patient  Final Clinical Impressions(s) / ED Diagnoses   Final diagnoses:  None    ED Discharge Orders    None       Ripley Fraise, MD 07/22/18 [phone]

## 2018-07-22 NOTE — Discharge Instructions (Addendum)
1.  Call your cardiologist office today to let them know you are in the emergency department. 2.  Continue your regular medications. 3.  Return to the emergency department if you have any new or worsening symptoms.

## 2018-07-30 ENCOUNTER — Ambulatory Visit: Payer: PPO | Admitting: Cardiology

## 2018-07-30 NOTE — Progress Notes (Signed)
HPI The patient presents for followup of coronary disease status post CABG and atrial flutter. He has been cardioverted. Subsequent follow-up in the atrial fibrillation clinic is demonstrated normal sinus rhythm.  He was supposed to get a Holter after the last visit but he just never got around to this.  He actually feels fine. The patient denies any new symptoms such as chest discomfort, neck or arm discomfort. There has been no new shortness of breath, PND or orthopnea. There have been no reported palpitations, presyncope or syncope.  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   No Known Allergies  Current Outpatient Medications  Medication Sig Dispense Refill  . allopurinol (ZYLOPRIM) 300 MG tablet TAKE 1 TABLET BY MOUTH EVERY DAY (Patient taking differently: Take 300 mg by mouth daily. ) 90 tablet 0  . amLODipine (NORVASC) 10 MG tablet TAKE 1 TABLET BY MOUTH EVERY DAY (Patient taking differently: Take 10 mg by mouth daily. ) 90 tablet 1  . atorvastatin (LIPITOR) 80 MG tablet Take 1 tablet (80 mg total) by mouth daily. 90 tablet 1  . doxazosin (CARDURA) 4 MG tablet Take 2 mg by mouth daily after supper.    . ferrous gluconate (FERGON) 324 MG tablet Take 324 mg by mouth 2 (two) times daily with a meal.    . furosemide (LASIX) 10 MG/ML solution Take 10 mg by mouth 2 (two) times daily.    . metoprolol tartrate (LOPRESSOR) 25 MG tablet TAKE 1 TABLET BY MOUTH TWICE A DAY (Patient taking differently: 12.5 mg 2 (two) times daily. ) 60 tablet 0  . potassium chloride SA (KLOR-CON M20) 20 MEQ tablet TAKE 1 TABLET (20 MEQ TOTAL) BY MOUTH 2 (TWO) TIMES DAILY. (Patient taking differently: Take 20 mEq by mouth 2 (two) times daily. TAKE 1 TABLET (20 MEQ TOTAL) BY MOUTH 2 (TWO) TIMES DAILY.) 180 tablet 0  . rivaroxaban (XARELTO) 20 MG TABS tablet TAKE 1 TABLET (20 MG TOTAL) BY MOUTH DAILY WITH SUPPER. (Patient taking differently: Take 20 mg by mouth daily with supper. ) 90 tablet 0   No current  facility-administered medications for this visit.     Past Medical History:  Diagnosis Date  . Angina   . Arthritis   . Atrial flutter ([place])   . Chronic kidney disease    hx of BPH  . Coronary atherosclerosis of native coronary artery   . Gout   . Hypertension   . Myocardial infarction ([place])   . Other and unspecified hyperlipidemia   . Pure hypercholesterolemia     Past Surgical History:  Procedure Laterality Date  . CATARACTS    . CHEST TUBE INSERTION  11/22/2011   Procedure: CHEST TUBE INSERTION;  Surgeon: Ivin Poot, MD;  Location: [place];  Service: Thoracic;  Laterality: Right;  . CORONARY ARTERY BYPASS GRAFT  11/20/2011   Procedure: CORONARY ARTERY BYPASS GRAFTING (CABG);  Surgeon: Ivin Poot, MD;  Location: [place];  Service: Open Heart Surgery;  Laterality: N/A;  Times 3. On Pump. Using endoscopically harvested right greater saphenous vein and left internal mammary artery.   Marland Kitchen ELECTROPHYSIOLOGIC STUDY N/A 06/27/2015   Procedure: Cardioversion;  Surgeon: Deboraha Sprang, MD;  Location: [place] CV LAB;  Service: Cardiovascular;  Laterality: N/A;  . LEFT AND RIGHT HEART CATHETERIZATION WITH CORONARY ANGIOGRAM N/A 11/16/2011   Procedure: LEFT AND RIGHT HEART CATHETERIZATION WITH CORONARY ANGIOGRAM;  Surgeon: Sherren Mocha, MD;  Location[facility] CATH LAB;  Service: Cardiovascular;  Laterality: N/A;  .  MAZE  11/20/2011   Procedure: MAZE;  Surgeon: Ivin Poot, MD;  Location: [place];  Service: Open Heart Surgery;  Laterality: N/A;    ROS: Positive for hard of hearing.  Otherwise stated in the HPI and negative for all other systems.  PHYSICAL EXAM GENERAL:  Well appearing NECK:  No jugular venous distention, waveform within normal limits, carotid upstroke brisk and symmetric, no bruits, no thyromegaly LUNGS:  Clear to auscultation bilaterally CHEST:  Unremarkable HEART:  PMI not displaced or sustained,S1 and S2 within normal limits, no S3, no clicks, no rubs, no murmurs,  irregular ABD:  Flat, positive bowel sounds normal in frequency in pitch, no bruits, no rebound, no guarding, no midline pulsatile mass, no hepatomegaly, no splenomegaly EXT:  2 plus pulses throughout, no edema, no cyanosis no clubbing   EKG:  Atrial fib, rate 90, left axis deviation, old anteroseptal infarct, NSST, left axis deviaiton.  Low voltage in the limb and chest leads.  08/01/2018   Lab Results  Component Value Date   CHOL 144 01/30/2018   TRIG 109 01/30/2018   HDL 39 (L) 01/30/2018   LDLCALC 83 01/30/2018    ASSESSMENT AND PLAN  CAD:   The patient has no new sypmtoms.  No further cardiovascular testing is indicated.  We will continue with aggressive risk reduction and meds as listed.  ATRIAL FLUTTER:    Mr. SAMRAT HAYWARD has a CHA2DS2 - VASc score of 4 .  I will try again to get a Holter on him for rate control.  Otherwise he has no symptoms.  He tolerates anticoagulation.  No other change in therapy.  HTN:  The blood pressure is at target.  No change in therapy.  DYSLIPIDEMIA:  His lipids are excellent.  He will continue with the meds as listed.   DOT:   He has no contraindication to DOT clearance.

## 2018-08-01 ENCOUNTER — Encounter: Payer: Self-pay | Admitting: Cardiology

## 2018-08-01 ENCOUNTER — Ambulatory Visit (INDEPENDENT_AMBULATORY_CARE_PROVIDER_SITE_OTHER): Payer: PPO | Admitting: Cardiology

## 2018-08-01 VITALS — BP 112/68 | HR 90 | Ht 71.0 in | Wt 258.8 lb

## 2018-08-01 DIAGNOSIS — I480 Paroxysmal atrial fibrillation: Secondary | ICD-10-CM

## 2018-08-01 DIAGNOSIS — I2510 Atherosclerotic heart disease of native coronary artery without angina pectoris: Secondary | ICD-10-CM

## 2018-08-01 DIAGNOSIS — I10 Essential (primary) hypertension: Secondary | ICD-10-CM | POA: Diagnosis not present

## 2018-08-01 DIAGNOSIS — E785 Hyperlipidemia, unspecified: Secondary | ICD-10-CM | POA: Diagnosis not present

## 2018-08-01 NOTE — Patient Instructions (Signed)
Medication Instructions:  Continue current medications  If you need a refill on your cardiac medications before your next appointment, please call your pharmacy.  Labwork: None Ordered   If you have labs (blood work) drawn today and your tests are completely normal, you will receive your results only by: Marland Kitchen MyChart Message (if you have MyChart) OR . A paper copy in the mail If you have any lab test that is abnormal or we need to change your treatment, we will call you to review the results.  Testing/Procedures: Your physician has recommended that you wear a 24 hour holter monitor. Holter monitors are medical devices that record the heart's electrical activity. Doctors most often use these monitors to diagnose arrhythmias. Arrhythmias are problems with the speed or rhythm of the heartbeat. The monitor is a [employer], portable device. You can wear one while you do your normal daily activities. This is usually used to diagnose what is causing palpitations/syncope (passing out).  Follow-Up: You will need a follow up appointment in 1 Year.  Please call our office 2 months in advance([phone]) to schedule the (1 Year) appointment.  You may see  DR Percival Spanish,  or one of the following Advanced Practice Providers on your designated Care Team:    . Rosaria Ferries, PA-C  -[place], DNP, ANP  . Kerin Ransom, [place]  . Almyra Deforest, [place], PA-C  At [facility], you and your health needs are our priority.  As part of our continuing mission to provide you with exceptional heart care, we have created designated Provider Care Teams.  These Care Teams include your primary Cardiologist (physician) and Advanced Practice Providers (APPs -  Physician Assistants and Nurse Practitioners) who all work together to provide you with the care you need, when you need it.   Thank you for choosing [facility] at [facility]!!

## 2018-08-04 NOTE — Addendum Note (Signed)
Addended by: Vennie Homans on: 08/04/2018 05:20 PM   Modules accepted: Orders

## 2018-08-13 ENCOUNTER — Ambulatory Visit (INDEPENDENT_AMBULATORY_CARE_PROVIDER_SITE_OTHER): Payer: PPO

## 2018-08-13 DIAGNOSIS — I480 Paroxysmal atrial fibrillation: Secondary | ICD-10-CM | POA: Diagnosis not present

## 2018-08-24 ENCOUNTER — Other Ambulatory Visit: Payer: Self-pay | Admitting: Cardiology

## 2018-09-17 ENCOUNTER — Other Ambulatory Visit: Payer: Self-pay | Admitting: Family Medicine

## 2018-09-20 ENCOUNTER — Other Ambulatory Visit: Payer: Self-pay | Admitting: Family Medicine

## 2018-09-26 ENCOUNTER — Other Ambulatory Visit: Payer: Self-pay | Admitting: Family Medicine

## 2018-09-26 NOTE — Telephone Encounter (Signed)
Last seen 04/09/18 

## 2018-09-28 ENCOUNTER — Other Ambulatory Visit: Payer: Self-pay | Admitting: Pediatrics

## 2018-09-29 NOTE — Telephone Encounter (Signed)
Last seen 04/09/18 

## 2018-10-16 ENCOUNTER — Other Ambulatory Visit: Payer: Self-pay | Admitting: Family Medicine

## 2018-10-16 NOTE — Telephone Encounter (Signed)
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

## 2018-10-16 NOTE — Telephone Encounter (Signed)
Left message- ntbs 

## 2018-11-15 ENCOUNTER — Other Ambulatory Visit: Payer: Self-pay | Admitting: Family Medicine

## 2018-12-10 ENCOUNTER — Other Ambulatory Visit: Payer: Self-pay | Admitting: Family Medicine

## 2018-12-10 NOTE — Telephone Encounter (Signed)
Dettinger. NTBS 30 days given 11/17/18

## 2018-12-11 ENCOUNTER — Other Ambulatory Visit: Payer: Self-pay | Admitting: Family Medicine

## 2018-12-11 NOTE — Telephone Encounter (Signed)
Left message- ntbs

## 2018-12-18 ENCOUNTER — Other Ambulatory Visit: Payer: Self-pay | Admitting: Pediatrics

## 2018-12-18 ENCOUNTER — Other Ambulatory Visit: Payer: Self-pay | Admitting: Cardiology

## 2018-12-18 ENCOUNTER — Other Ambulatory Visit: Payer: Self-pay | Admitting: Family Medicine

## 2018-12-18 NOTE — Telephone Encounter (Signed)
Lopressor 25 mg refilled 

## 2018-12-19 NOTE — Telephone Encounter (Signed)
LM -pt aware he will NTBS and to call office.

## 2018-12-19 NOTE — Telephone Encounter (Signed)
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

## 2018-12-22 ENCOUNTER — Other Ambulatory Visit: Payer: Self-pay

## 2018-12-22 ENCOUNTER — Ambulatory Visit (INDEPENDENT_AMBULATORY_CARE_PROVIDER_SITE_OTHER): Payer: PPO | Admitting: Family Medicine

## 2018-12-22 ENCOUNTER — Encounter: Payer: Self-pay | Admitting: Family Medicine

## 2018-12-22 DIAGNOSIS — I480 Paroxysmal atrial fibrillation: Secondary | ICD-10-CM | POA: Diagnosis not present

## 2018-12-22 DIAGNOSIS — I10 Essential (primary) hypertension: Secondary | ICD-10-CM

## 2018-12-22 DIAGNOSIS — E7800 Pure hypercholesterolemia, unspecified: Secondary | ICD-10-CM

## 2018-12-22 MED ORDER — RIVAROXABAN 20 MG PO TABS: 20.0000 mg | ORAL_TABLET | Freq: Every day | ORAL | 3 refills | Status: DC

## 2018-12-22 MED ORDER — ALLOPURINOL 300 MG PO TABS: 300.0000 mg | ORAL_TABLET | Freq: Every day | ORAL | 3 refills | Status: DC

## 2018-12-22 MED ORDER — POTASSIUM CHLORIDE CRYS ER 20 MEQ PO TBCR: 20.0000 meq | EXTENDED_RELEASE_TABLET | Freq: Two times a day (BID) | ORAL | 3 refills | Status: DC

## 2018-12-22 MED ORDER — AMLODIPINE BESYLATE 10 MG PO TABS: 10.0000 mg | ORAL_TABLET | Freq: Every day | ORAL | 3 refills | Status: DC

## 2018-12-22 MED ORDER — ATORVASTATIN CALCIUM 80 MG PO TABS: 80.0000 mg | ORAL_TABLET | Freq: Every day | ORAL | 3 refills | Status: DC

## 2018-12-22 MED ORDER — FUROSEMIDE 40 MG PO TABS: 40.0000 mg | ORAL_TABLET | Freq: Every day | ORAL | 3 refills | Status: DC

## 2018-12-22 NOTE — Progress Notes (Signed)
Virtual Visit via telephone Note  I connected with [place] on 12/22/18 at 0849 by telephone and verified that I am speaking with the correct person using two identifiers. Marcus [name] is currently located at home and daughter are currently with her during visit. The provider, Fransisca Kaufmann Kemiya Batdorf, MD is located in their office at time of visit.  Call ended at 0857  I discussed the limitations, risks, security and privacy concerns of performing an evaluation and management service by telephone and the availability of in person appointments. I also discussed with the patient that there may be a patient responsible charge related to this service. The patient expressed understanding and agreed to proceed.   History and Present Illness: Hypertension Patient is currently on amlodipine and doxazosin and metoprolol, and their blood pressure today is unknown because he does not have a way to check at home. Patient denies any lightheadedness or dizziness. Patient denies headaches, blurred vision, chest pains, shortness of breath, or weakness. Denies any side effects from medication and is content with current medication.   Hyperlipidemia Patient is coming in for recheck of his hyperlipidemia. The patient is currently taking atorvastatin. They deny any issues with myalgias or history of liver damage from it. They deny any focal numbness or weakness or chest pain.   afib with xarelto Patient has been on Xarelto for A. fib and says that it is doing well for him.  He denies any major bruising or bleeding.  He denies any chest pain or palpitations and says that in general he is feeling pretty well.  No diagnosis found.  Outpatient Encounter Medications as of 12/22/2018  Medication Sig  . allopurinol (ZYLOPRIM) 300 MG tablet Take 1 tablet (300 mg total) by mouth daily. (Needs to be seen before next refill)  . amLODipine (NORVASC) 10 MG tablet Take 1 tablet (10 mg total) by mouth daily. (Please make  you regular med ckup)  . atorvastatin (LIPITOR) 80 MG tablet Take 1 tablet (80 mg total) by mouth daily. (Needs to be seen before next refill)  . doxazosin (CARDURA) 4 MG tablet Take 2 mg by mouth daily after supper.  . ferrous gluconate (FERGON) 324 MG tablet Take 324 mg by mouth 2 (two) times daily with a meal.  . furosemide (LASIX) 40 MG tablet Take 1 tablet (40 mg total) by mouth daily. (Needs to be seen before next refill)  . metoprolol tartrate (LOPRESSOR) 25 MG tablet TAKE 1 TABLET BY MOUTH TWICE A DAY  . potassium chloride SA (KLOR-CON M20) 20 MEQ tablet Take 1 tablet (20 mEq total) by mouth 2 (two) times daily. (Needs to be seen before next refill)  . rivaroxaban (XARELTO) 20 MG TABS tablet Take 1 tablet (20 mg total) by mouth daily with supper. (Needs to be seen before next refill)   No facility-administered encounter medications on file as of 12/22/2018.     Review of Systems  Constitutional: Negative for chills and fever.  Eyes: Negative for visual disturbance.  Respiratory: Negative for shortness of breath and wheezing.   Cardiovascular: Negative for chest pain and leg swelling.  Musculoskeletal: Negative for back pain and gait problem.  Skin: Negative for rash.  Neurological: Negative for dizziness, weakness and numbness.  All other systems reviewed and are negative.   Observations/Objective: Patient sounds comfortable and in no acute distress  Assessment and Plan: Problem List Items Addressed This Visit      Cardiovascular and Mediastinum   HTN (hypertension) - Primary  Relevant Medications   amLODipine (NORVASC) 10 MG tablet   atorvastatin (LIPITOR) 80 MG tablet   furosemide (LASIX) 40 MG tablet   rivaroxaban (XARELTO) 20 MG TABS tablet   Paroxysmal atrial fibrillation (HCC)   Relevant Medications   amLODipine (NORVASC) 10 MG tablet   atorvastatin (LIPITOR) 80 MG tablet   furosemide (LASIX) 40 MG tablet   rivaroxaban (XARELTO) 20 MG TABS tablet     Other    Pure hypercholesterolemia   Relevant Medications   amLODipine (NORVASC) 10 MG tablet   atorvastatin (LIPITOR) 80 MG tablet   furosemide (LASIX) 40 MG tablet   rivaroxaban (XARELTO) 20 MG TABS tablet       Follow Up Instructions:  Follow up in 3-4 months for hypertension   I discussed the assessment and treatment plan with the patient. The patient was provided an opportunity to ask questions and all were answered. The patient agreed with the plan and demonstrated an understanding of the instructions.   The patient was advised to call back or seek an in-person evaluation if the symptoms worsen or if the condition fails to improve as anticipated.  The above assessment and management plan was discussed with the patient. The patient verbalized understanding of and has agreed to the management plan. Patient is aware to call the clinic if symptoms persist or worsen. Patient is aware when to return to the clinic for a follow-up visit. Patient educated on when it is appropriate to go to the emergency department.    I provided 8 minutes of non-face-to-face time during this encounter.    Worthy Rancher, MD

## 2019-01-15 ENCOUNTER — Ambulatory Visit (INDEPENDENT_AMBULATORY_CARE_PROVIDER_SITE_OTHER): Payer: PPO | Admitting: *Deleted

## 2019-01-15 ENCOUNTER — Other Ambulatory Visit: Payer: Self-pay

## 2019-01-15 DIAGNOSIS — Z0000 Encounter for general adult medical examination without abnormal findings: Secondary | ICD-10-CM

## 2019-01-15 NOTE — Progress Notes (Signed)
MEDICARE ANNUAL WELLNESS VISIT  01/15/2019  Telephone Visit Disclaimer This Medicare AWV was conducted by telephone due to national recommendations for restrictions regarding the COVID-19 Pandemic (e.g. social distancing).  I verified, using two identifiers, that I am speaking with Marcus [name] or their authorized healthcare agent. I discussed the limitations, risks, security, and privacy concerns of performing an evaluation and management service by telephone and the potential availability of an in-person appointment in the future. The patient expressed understanding and agreed to proceed.   Subjective:  Marcus [name] is a 85 y.o. male patient of Marcus [name], Marcus Kaufmann, MD who had a Medicare Annual Wellness Visit today via telephone. Marcus [name] is Working full time and lives alone. he has 2 children. he reports that he is socially active and does interact with friends/family regularly. he is minimally physically active and enjoys going to drag racing events.  Patient Care Team: Marcus [name], Marcus Kaufmann, MD as PCP - General (Family Medicine) Prescott Gum, Collier Salina, MD as Attending Physician (Cardiothoracic Surgery) de Stanford Scotland, MD (Inactive) (Cardiology)  Advanced Directives 01/15/2019 07/22/2018 06/25/2015 06/25/2015 11/15/2011  Does Patient Have a Medical Advance Directive? Yes No No No Patient does not have advance directive  Type of Scientist, forensic Power of [place];Living will - - - -  Copy of [place] in Chart? No - copy requested - - - -  Would patient like information on creating a medical advance directive? - Yes (ED - Information included in AVS) No - patient declined information No - patient declined information -  Pre-existing out of facility DNR order (yellow form or pink MOST form) - - - - No    Hospital Utilization Over the Past 12 Months: # of hospitalizations or ER visits: 1 # of surgeries: 0  Review of Systems    Patient reports that his overall  health is unchanged compared to last year.  Patient Reported Readings (BP, Pulse, CBG, Weight, etc) none  Review of Systems: No complaints  All other systems negative.  Pain Assessment Pain : No/denies pain     Current Medications & Allergies (verified) Allergies as of 01/15/2019   No Known Allergies     Medication List       Accurate as of Jan 15, 2019  2:49 PM. If you have any questions, ask your nurse or doctor.        allopurinol 300 MG tablet Commonly known as:  ZYLOPRIM Take 1 tablet (300 mg total) by mouth daily. (Needs to be seen before next refill)   amLODipine 10 MG tablet Commonly known as:  NORVASC Take 1 tablet (10 mg total) by mouth daily. (Please make you regular med ckup)   atorvastatin 80 MG tablet Commonly known as:  LIPITOR Take 1 tablet (80 mg total) by mouth daily. (Needs to be seen before next refill)   doxazosin 4 MG tablet Commonly known as:  CARDURA Take 2 mg by mouth daily after supper.   ferrous gluconate 324 MG tablet Commonly known as:  FERGON Take 324 mg by mouth 2 (two) times daily with a meal.   furosemide 40 MG tablet Commonly known as:  LASIX Take 1 tablet (40 mg total) by mouth daily. (Needs to be seen before next refill)   metoprolol tartrate 25 MG tablet Commonly known as:  LOPRESSOR TAKE 1 TABLET BY MOUTH TWICE A DAY   potassium chloride SA 20 MEQ tablet Commonly known as:  Klor-Con M20 Take 1 tablet (20  mEq total) by mouth 2 (two) times daily. (Needs to be seen before next refill)   rivaroxaban 20 MG Tabs tablet Commonly known as:  Xarelto Take 1 tablet (20 mg total) by mouth daily with supper. (Needs to be seen before next refill)       History (reviewed): Past Medical History:  Diagnosis Date  . Angina   . Arthritis   . Atrial flutter ([place])   . Chronic kidney disease    hx of BPH  . Coronary atherosclerosis of native coronary artery   . Gout   . Hypertension   . Myocardial infarction ([place])   . Other  and unspecified hyperlipidemia   . Pure hypercholesterolemia    Past Surgical History:  Procedure Laterality Date  . CATARACTS    . CHEST TUBE INSERTION  11/22/2011   Procedure: CHEST TUBE INSERTION;  Surgeon: Ivin Poot, MD;  Location: [facility];  Service: Thoracic;  Laterality: Right;  . CORONARY ARTERY BYPASS GRAFT  11/20/2011   Procedure: CORONARY ARTERY BYPASS GRAFTING (CABG);  Surgeon: Ivin Poot, MD;  Location: [facility];  Service: Open Heart Surgery;  Laterality: N/A;  Times 3. On Pump. Using endoscopically harvested right greater saphenous vein and left internal mammary artery.   Marland Kitchen ELECTROPHYSIOLOGIC STUDY N/A 06/27/2015   Procedure: Cardioversion;  Surgeon: Deboraha Sprang, MD;  Location: [facility];  Service: Cardiovascular;  Laterality: N/A;  . LEFT AND RIGHT HEART CATHETERIZATION WITH CORONARY ANGIOGRAM N/A 11/16/2011   Procedure: LEFT AND RIGHT HEART CATHETERIZATION WITH CORONARY ANGIOGRAM;  Surgeon: Sherren Mocha, MD;  [facility];  Service: Cardiovascular;  Laterality: N/A;  . MAZE  11/20/2011   Procedure: MAZE;  Surgeon: Ivin Poot, MD;  Location: [facility];  Service: Open Heart Surgery;  Laterality: N/A;   Family History  Problem Relation Age of Onset  . Colon cancer Son    Social History   Socioeconomic History  . Marital status: Widowed    Spouse name: [name]  . Number of children: 2  . Years of education: Not on file  . Highest education level: High school graduate  Occupational History  . Occupation: works with "heavy equipment"  Social Needs  . Financial resource strain: Not hard at all  . Food insecurity:    Worry: Never true    Inability: Never true  . Transportation needs:    Medical: No    Non-medical: No  Tobacco Use  . Smoking status: Never Smoker  . Smokeless tobacco: Never Used  Substance and Sexual Activity  . Alcohol use: Not Currently    Comment: hasn't had an alcoholic drink in 7 years  . Drug use: No  . Sexual activity:  Never    Birth control/protection: None  Lifestyle  . Physical activity:    Days per week: 0 days    Minutes per session: 0 min  . Stress: Not at all  Relationships  . Social connections:    Talks on phone: More than three times a week    Gets together: Twice a week    Attends religious service: More than 4 times per year    Active member of club or organization: Yes    Attends meetings of clubs or organizations: More than 4 times per year    Relationship status: Widowed  Other Topics Concern  . Not on file  Social History Narrative  . Not on file    Activities of Daily Living In your present state of  health, do you have any difficulty performing the following activities: 01/15/2019  Hearing? Y  Comment pt has hearing aids and says they help  Vision? N  Difficulty concentrating or making decisions? N  Walking or climbing stairs? N  Dressing or bathing? N  Doing errands, shopping? N  Preparing Food and eating ? N  Using the Toilet? N  In the past six months, have you accidently leaked urine? N  Do you have problems with loss of bowel control? N  Managing your Medications? N  Managing your Finances? N  Housekeeping or managing your Housekeeping? N  Some recent data might be hidden    Patient Literacy How often do you need to have someone help you when you read instructions, pamphlets, or other written materials from your doctor or pharmacy?: 1 - Never What is the last grade level you completed in school?: 12th grade  Exercise Current Exercise Habits: The patient does not participate in regular exercise at present, Exercise limited by: cardiac condition(s)  Diet Patient reports consuming 2 meals a day and 1 snack(s) a day Patient reports that his primary diet is: Regular Patient reports that she does have regular access to food.   Depression Screen PHQ 2/9 Scores 01/15/2019 04/09/2018 01/30/2018 01/06/2018 03/19/2017 06/04/2016 02/21/2016  PHQ - 2 Score 0 0 0 0 0 0 0      Fall Risk Fall Risk  01/15/2019 04/09/2018 01/30/2018 03/19/2017 06/04/2016  Falls in the past year? 0 No No No No     Objective:  Marcus [name] seemed alert and oriented and he participated appropriately during our telephone visit.  Blood Pressure Weight BMI  BP Readings from Last 3 Encounters:  08/01/18 112/68  07/22/18 120/78  04/18/18 130/70   Wt Readings from Last 3 Encounters:  08/01/18 258 lb 12.8 oz (117.4 kg)  07/22/18 257 lb (116.6 kg)  04/18/18 259 lb (117.5 kg)   BMI Readings from Last 1 Encounters:  08/01/18 36.10 kg/m    *Unable to obtain current vital signs, weight, and BMI due to telephone visit type  Hearing/Vision  . Byard did not seem to have difficulty with hearing/understanding during the telephone conversation . Reports that he has not had a formal eye exam by an eye care professional within the past year . Reports that he has not had a formal hearing evaluation within the past year *Unable to fully assess hearing and vision during telephone visit type  Cognitive Function: 6CIT Screen 01/15/2019  What Year? 0 points  What month? 0 points  What time? 0 points  Count back from 20 0 points  Months in reverse 0 points  Repeat phrase 2 points  Total Score 2    Normal Cognitive Function Screening: Yes (Normal:0-7, Significant for Dysfunction: >8)  Immunization & Health Maintenance Record Immunization History  Administered Date(s) Administered  . Influenza, High Dose Seasonal PF 05/24/2016, 05/24/2017  . Influenza,inj,Quad PF,6+ Mos 06/11/2018  . Influenza-Unspecified 05/15/2014  . Td 08/27/2012    Health Maintenance  Topic Date Due  . PNA vac Low Risk Adult (1 of 2 - PCV13) 01/15/2020 (Originally 09/08/1999)  . INFLUENZA VACCINE  03/28/2019  . TETANUS/TDAP  08/27/2022       Assessment  This is a routine wellness examination for Latavion L Perrault.  Health Maintenance: Due or Overdue There are no preventive care reminders to display for this patient.   Jaeshaun L Fries does not need a referral for Community Assistance: Care Management:   no Social Work:  no Prescription Assistance:  no Nutrition/Diabetes Education:  no   Plan:  Personalized Goals Goals Addressed            This Visit's Progress   . Have 3 meals a day (pt-stated)       Increase fruits and veggies into his diet      Personalized Health Maintenance & Screening Recommendations  Shingles and Pneumonia vaccines-will consider and discuss with PCP at next visit  Lung Cancer Screening Recommended: no (Low Dose CT Chest recommended if Age 67-80 years, 30 pack-year currently smoking OR have quit w/in past 15 years) Hepatitis C Screening recommended: no HIV Screening recommended: no  Advanced Directives: Written information was not prepared per patient's request.  Referrals & Orders No orders of the defined types were placed in this encounter.   Follow-up Plan . Follow-up with Marcus [name], Marcus Kaufmann, MD as planned . Bring a copy of your Advanced Directives with you to your next appt with your PCP for our records . Consider Shingles and Pneumonia vaccines   I have personally reviewed and noted the following in the patient's chart:   . Medical and social history . Use of alcohol, tobacco or illicit drugs  . Current medications and supplements . Functional ability and status . Nutritional status . Physical activity . Advanced directives . List of other physicians . Hospitalizations, surgeries, and ER visits in previous 12 months . Vitals . Screenings to include cognitive, depression, and falls . Referrals and appointments  In addition, I have reviewed and discussed with Yahye L Job certain preventive protocols, quality metrics, and best practice recommendations. A written personalized care plan for preventive services as well as general preventive health recommendations is available and can be mailed to the patient at his request.      signature   01/15/2019

## 2019-01-15 NOTE — Patient Instructions (Signed)
Preventive Care 2 Years and Older, Male Preventive care refers to lifestyle choices and visits with your health care provider that can promote health and wellness. What does preventive care include?   A yearly physical exam. This is also called an annual well check.  Dental exams once or twice a year.  Routine eye exams. Ask your health care provider how often you should have your eyes checked.  Personal lifestyle choices, including: ? Daily care of your teeth and gums. ? Regular physical activity. ? Eating a healthy diet. ? Avoiding tobacco and drug use. ? Limiting alcohol use. ? Practicing safe sex. ? Taking low doses of aspirin every day. ? Taking vitamin and mineral supplements as recommended by your health care provider. What happens during an annual well check? The services and screenings done by your health care provider during your annual well check will depend on your age, overall health, lifestyle risk factors, and family history of disease. Counseling Your health care provider may ask you questions about your:  Alcohol use.  Tobacco use.  Drug use.  Emotional well-being.  Home and relationship well-being.  Sexual activity.  Eating habits.  History of falls.  Memory and ability to understand (cognition).  Work and work Statistician. Screening You may have the following tests or measurements:  Height, weight, and BMI.  Blood pressure.  Lipid and cholesterol levels. These may be checked every 5 years, or more frequently if you are over 9 years old.  Skin check.  Lung cancer screening. You may have this screening every year starting at age 57 if you have a 30-pack-year history of smoking and currently smoke or have quit within the past 15 years.  Colorectal cancer screening. All adults should have this screening starting at age 90 and continuing until age 69. You will have tests every 1-10 years, depending on your results and the type of screening  test. People at increased risk should start screening at an earlier age. Screening tests may include: ? Guaiac-based fecal occult blood testing. ? Fecal immunochemical test (FIT). ? Stool DNA test. ? Virtual colonoscopy. ? Sigmoidoscopy. During this test, a flexible tube with a tiny camera (sigmoidoscope) is used to examine your rectum and lower colon. The sigmoidoscope is inserted through your anus into your rectum and lower colon. ? Colonoscopy. During this test, a long, thin, flexible tube with a tiny camera (colonoscope) is used to examine your entire colon and rectum.  Prostate cancer screening. Recommendations will vary depending on your family history and other risks.  Hepatitis C blood test.  Hepatitis B blood test.  Sexually transmitted disease (STD) testing.  Diabetes screening. This is done by checking your blood sugar (glucose) after you have not eaten for a while (fasting). You may have this done every 1-3 years.  Abdominal aortic aneurysm (AAA) screening. You may need this if you are a current or former smoker.  Osteoporosis. You may be screened starting at age 30 if you are at high risk. Talk with your health care provider about your test results, treatment options, and if necessary, the need for more tests. Vaccines Your health care provider may recommend certain vaccines, such as:  Influenza vaccine. This is recommended every year.  Tetanus, diphtheria, and acellular pertussis (Tdap, Td) vaccine. You may need a Td booster every 10 years.  Varicella vaccine. You may need this if you have not been vaccinated.  Zoster vaccine. You may need this after age 42.  Measles, mumps, and rubella (MMR) vaccine.  You may need at least one dose of MMR if you were born in 1957 or later. You may also need a second dose.  Pneumococcal 13-valent conjugate (PCV13) vaccine. One dose is recommended after age 59.  Pneumococcal polysaccharide (PPSV23) vaccine. One dose is recommended  after age 75.  Meningococcal vaccine. You may need this if you have certain conditions.  Hepatitis A vaccine. You may need this if you have certain conditions or if you travel or work in places where you may be exposed to hepatitis A.  Hepatitis B vaccine. You may need this if you have certain conditions or if you travel or work in places where you may be exposed to hepatitis B.  Haemophilus influenzae type b (Hib) vaccine. You may need this if you have certain risk factors. Talk to your health care provider about which screenings and vaccines you need and how often you need them. This information is not intended to replace advice given to you by your health care provider. Make sure you discuss any questions you have with your health care provider. Document Released: 09/09/2015 Document Revised: 10/03/2017 Document Reviewed: 06/14/2015 Elsevier Interactive Patient Education  2019 [employer].

## 2019-06-05 DIAGNOSIS — H26491 Other secondary cataract, right eye: Secondary | ICD-10-CM | POA: Diagnosis not present

## 2019-06-22 DIAGNOSIS — H26492 Other secondary cataract, left eye: Secondary | ICD-10-CM | POA: Diagnosis not present

## 2019-06-22 DIAGNOSIS — H26491 Other secondary cataract, right eye: Secondary | ICD-10-CM | POA: Diagnosis not present

## 2019-07-29 ENCOUNTER — Telehealth: Payer: Self-pay | Admitting: Family Medicine

## 2019-07-29 NOTE — Chronic Care Management (AMB) (Signed)
°  Chronic Care Management   Outreach Note  07/29/2019 Name: Marcus [name] MRN: [id number] DOB: 1932/10/28  Referred by: Dettinger, Fransisca Kaufmann, MD Reason for referral : Chronic Care Management (Initial CCM outreach was unsuccessful. )   An unsuccessful telephone outreach was attempted today. The patient was referred to the case management team by for assistance with care management and care coordination.   Follow Up Plan: The care management team will reach out to the patient again over the next 7 days.   [place], [place] [address] Direct Dial: [email or web address]  Website: [email or web address]

## 2019-08-05 NOTE — Chronic Care Management (AMB) (Signed)
°  Chronic Care Management   Outreach Note  08/05/2019 Name: EDIZ NEYLON MRN: [id number] DOB: [birth year]  Referred by: Dettinger, Fransisca Kaufmann, MD Reason for referral : Chronic Care Management (Initial CCM outreach was unsuccessful. ) and Chronic Care Management (Second CCM outreach was unsuccessful.)   A second unsuccessful telephone outreach was attempted today. The patient was referred to the case management team for assistance with care management and care coordination.   Follow Up Plan: The care management team will reach out to the patient again over the next 7 days.   [place], [place] [address] Direct Dial: [email or web address]  Website: [email or web address]

## 2019-08-10 NOTE — Chronic Care Management (AMB) (Signed)
  Chronic Care Management   Note  08/10/2019 Name: SHERI PROWS MRN: [id number] DOB: [birth year]  DARYAN BUELL is a 85 y.o. year old male who is a primary care patient of Dettinger, Fransisca Kaufmann, MD. I reached out to [employer] by phone today in response to a referral sent by Mr. Caryn Section Deane's health plan.     Mr. Huckaba was given information about Chronic Care Management services today including:  1. CCM service includes personalized support from designated clinical staff supervised by his physician, including individualized plan of care and coordination with other care providers 2. 24/7 contact phone numbers for assistance for urgent and routine care needs. 3. Service will only be billed when office clinical staff spend 20 minutes or more in a month to coordinate care. 4. Only one practitioner may furnish and bill the service in a calendar month. 5. The patient may stop CCM services at any time (effective at the end of the month) by phone call to the office staff. 6. The patient will be responsible for cost sharing (co-pay) of up to 20% of the service fee (after annual deductible is met).  Patient did not agree to enrollment in care management services and does not wish to consider at this time.  Follow up plan: The patient has been provided with contact information for the chronic care management team and has been advised to call with any health related questions or concerns.   [place], [place] [address] Direct Dial: [email or web address]  Website: [email or web address]

## 2019-08-31 ENCOUNTER — Other Ambulatory Visit: Payer: Self-pay | Admitting: Cardiology

## 2019-08-31 NOTE — Progress Notes (Signed)
Cardiology Office Note   Date:  09/02/2019   ID:  FARHAN LEHN, DOB 12-Nov-1932, MRN [id number]  PCP:  Dettinger, Fransisca Kaufmann, MD  Cardiologist:   No primary care provider on file.   Chief Complaint  Patient presents with  . Atrial Fibrillation      History of Present Illness: Marcus [name] is a 86 y.o. male who presents for followup of coronary disease status post CABG and atrial flutter. He has been cardioverted. Subsequent follow-up in the atrial fibrillation clinic is demonstrated normal sinus rhythm.    Since I last saw him he has felt well. The patient denies any new symptoms such as chest discomfort, neck or arm discomfort. There has been no new shortness of breath, PND or orthopnea. There have been no reported palpitations, presyncope or syncope.  He has not noticed that he is in fibrillation.  He still drives a bulldozer/dump truck.   Past Medical History:  Diagnosis Date  . Angina   . Arthritis   . Atrial flutter ([place])   . Chronic kidney disease    hx of BPH  . Coronary atherosclerosis of native coronary artery   . Gout   . Hypertension   . Myocardial infarction ([place])   . Other and unspecified hyperlipidemia   . Pure hypercholesterolemia     Past Surgical History:  Procedure Laterality Date  . CATARACTS    . CHEST TUBE INSERTION  11/22/2011   Procedure: CHEST TUBE INSERTION;  Surgeon: Ivin Poot, MD;  Location: [place];  Service: Thoracic;  Laterality: Right;  . CORONARY ARTERY BYPASS GRAFT  11/20/2011   Procedure: CORONARY ARTERY BYPASS GRAFTING (CABG);  Surgeon: Ivin Poot, MD;  Location: [place];  Service: Open Heart Surgery;  Laterality: N/A;  Times 3. On Pump. Using endoscopically harvested right greater saphenous vein and left internal mammary artery.   Marland Kitchen ELECTROPHYSIOLOGIC STUDY N/A 06/27/2015   Procedure: Cardioversion;  Surgeon: Deboraha Sprang, MD;  Location: [place] CV LAB;  Service: Cardiovascular;  Laterality: N/A;  . LEFT AND RIGHT HEART  CATHETERIZATION WITH CORONARY ANGIOGRAM N/A 11/16/2011   Procedure: LEFT AND RIGHT HEART CATHETERIZATION WITH CORONARY ANGIOGRAM;  Surgeon: Sherren Mocha, MD;  Location: [facility] CATH LAB;  Service: Cardiovascular;  Laterality: N/A;  . MAZE  11/20/2011   Procedure: MAZE;  Surgeon: Ivin Poot, MD;  Location: [place];  Service: Open Heart Surgery;  Laterality: N/A;     Current Outpatient Medications  Medication Sig Dispense Refill  . allopurinol (ZYLOPRIM) 300 MG tablet Take 1 tablet (300 mg total) by mouth daily. (Needs to be seen before next refill) 90 tablet 3  . amLODipine (NORVASC) 10 MG tablet Take 1 tablet (10 mg total) by mouth daily. (Please make you regular med ckup) 90 tablet 3  . atorvastatin (LIPITOR) 80 MG tablet Take 1 tablet (80 mg total) by mouth daily. (Needs to be seen before next refill) 90 tablet 3  . doxazosin (CARDURA) 4 MG tablet Take 2 mg by mouth daily after supper.    . ferrous gluconate (FERGON) 324 MG tablet Take 324 mg by mouth 2 (two) times daily with a meal.    . furosemide (LASIX) 40 MG tablet Take 1 tablet (40 mg total) by mouth daily. (Needs to be seen before next refill) 90 tablet 3  . metoprolol tartrate (LOPRESSOR) 25 MG tablet 25 mg. Take 1/2 tablet by mouth twice daily    . potassium chloride SA (KLOR-CON M20) 20 MEQ  tablet Take 1 tablet (20 mEq total) by mouth 2 (two) times daily. (Needs to be seen before next refill) 180 tablet 3  . rivaroxaban (XARELTO) 20 MG TABS tablet Take 1 tablet (20 mg total) by mouth daily with supper. (Needs to be seen before next refill) 90 tablet 3   No current facility-administered medications for this visit.    Allergies:   Patient has no known allergies.    ROS:  Please see the history of present illness.   Otherwise, review of systems are positive for none.   All other systems are reviewed and negative.    PHYSICAL EXAM: VS:  BP 122/80   Pulse 93   Ht 5\' 11"  (1.803 m)   Wt 246 lb (111.6 kg)   BMI 34.31 kg/m  , BMI  Body mass index is 34.31 kg/m. GENERAL:  Well appearing NECK:  No jugular venous distention, waveform within normal limits, carotid upstroke brisk and symmetric, no bruits, no thyromegaly LUNGS:  Clear to auscultation bilaterally CHEST:  Unremarkable HEART:  PMI not displaced or sustained,S1 and S2 within normal limits, no S3, no clicks, no rubs, no murmurs, irregular ABD:  Flat, positive bowel sounds normal in frequency in pitch, no bruits, no rebound, no guarding, no midline pulsatile mass, no hepatomegaly, no splenomegaly EXT:  2 plus pulses throughout, no edema, no cyanosis no clubbing   EKG:  EKG is ordered today. The ekg ordered today demonstrates atrial fibrillation, rate 93, left axis deviation, low voltage in the limb and chest leads, poor anterior R wave progression, nonspecific diffuse T wave flattening.   Recent Labs: No results found for requested labs within last 8760 hours.    Lipid Panel    Component Value Date/Time   CHOL 144 01/30/2018 1315   TRIG 109 01/30/2018 1315   HDL 39 (L) 01/30/2018 1315   CHOLHDL 3.7 01/30/2018 1315   CHOLHDL 3.8 05/17/2014 0941   VLDL 30 05/17/2014 0941   LDLCALC 83 01/30/2018 1315      Wt Readings from Last 3 Encounters:  09/02/19 246 lb (111.6 kg)  08/01/18 258 lb 12.8 oz (117.4 kg)  07/22/18 257 lb (116.6 kg)      Other studies Reviewed: Additional studies/ records that were reviewed today include: None. Review of the above records demonstrates:  Please see elsewhere in the note.     ASSESSMENT AND PLAN:  CAD:    The patient has no new sypmtoms.  No further cardiovascular testing is indicated.  We will continue with aggressive risk reduction and meds as listed.  ATRIAL FLUTTER:    [name] has a CHA2DS2 - VASc score of 4 .   He tolerates anticoagulation and has had reasonable rate control.  No change in therapy.  He is overdue for blood work and I will check a CBC and anemia.  HTN:  The blood pressure is at  target.  No change in therapy.   DYSLIPIDEMIA:  His lipids he needs a fasting lipid profile and I will arrange this.   DOT:    He is up to date.  COVID EDUCATION:    He wants to be the first line for vaccine and we talked about this.   Current medicines are reviewed at length with the patient today.  The patient does not have concerns regarding medicines.  The following changes have been made:  no change  Labs/ tests ordered today include:   Orders Placed This Encounter  Procedures  . CBC  .  Basic Metabolic Panel (BMET)  . Lipid Profile  . EKG 12-Lead     Disposition:   FU with me in one year.     Signed, Minus Breeding, MD  09/02/2019 6:36 PM     [facility]

## 2019-09-02 ENCOUNTER — Other Ambulatory Visit: Payer: Self-pay

## 2019-09-02 ENCOUNTER — Ambulatory Visit (INDEPENDENT_AMBULATORY_CARE_PROVIDER_SITE_OTHER): Payer: PPO | Admitting: Cardiology

## 2019-09-02 ENCOUNTER — Encounter: Payer: Self-pay | Admitting: Cardiology

## 2019-09-02 VITALS — BP 122/80 | HR 93 | Ht 71.0 in | Wt 246.0 lb

## 2019-09-02 DIAGNOSIS — E785 Hyperlipidemia, unspecified: Secondary | ICD-10-CM

## 2019-09-02 DIAGNOSIS — I2510 Atherosclerotic heart disease of native coronary artery without angina pectoris: Secondary | ICD-10-CM | POA: Diagnosis not present

## 2019-09-02 DIAGNOSIS — I10 Essential (primary) hypertension: Secondary | ICD-10-CM

## 2019-09-02 DIAGNOSIS — I4892 Unspecified atrial flutter: Secondary | ICD-10-CM

## 2019-09-02 DIAGNOSIS — Z79899 Other long term (current) drug therapy: Secondary | ICD-10-CM

## 2019-09-02 DIAGNOSIS — Z7189 Other specified counseling: Secondary | ICD-10-CM

## 2019-09-02 NOTE — Patient Instructions (Addendum)
Medication Instructions:  The current medical regimen is effective;  continue present plan and medications.  *If you need a refill on your cardiac medications before your next appointment, please call your pharmacy*  Lab Work: Please have blood work (CBC, BMP, fasting Lipid)  If you have labs (blood work) drawn today and your tests are completely normal, you will receive your results only by: Marland Kitchen MyChart Message (if you have MyChart) OR . A paper copy in the mail If you have any lab test that is abnormal or we need to change your treatment, we will call you to review the results.  Follow-Up: At [facility], you and your health needs are our priority.  As part of our continuing mission to provide you with exceptional heart care, we have created designated Provider Care Teams.  These Care Teams include your primary Cardiologist (physician) and Advanced Practice Providers (APPs -  Physician Assistants and Nurse Practitioners) who all work together to provide you with the care you need, when you need it.  Your next appointment:   12 month(s)  The format for your next appointment:   In Person  Provider:   Minus Breeding, MD  Thank you for choosing [facility]!!

## 2019-09-22 ENCOUNTER — Other Ambulatory Visit: Payer: Self-pay

## 2019-09-22 ENCOUNTER — Other Ambulatory Visit: Payer: PPO

## 2019-09-22 DIAGNOSIS — E785 Hyperlipidemia, unspecified: Secondary | ICD-10-CM | POA: Diagnosis not present

## 2019-09-22 DIAGNOSIS — I10 Essential (primary) hypertension: Secondary | ICD-10-CM | POA: Diagnosis not present

## 2019-09-22 DIAGNOSIS — Z79899 Other long term (current) drug therapy: Secondary | ICD-10-CM | POA: Diagnosis not present

## 2019-09-22 DIAGNOSIS — I4892 Unspecified atrial flutter: Secondary | ICD-10-CM

## 2019-09-22 LAB — LIPID PANEL
Chol/HDL Ratio: 2.9 ratio (ref 0.0–5.0)
Cholesterol, Total: 128 mg/dL (ref 100–199)
HDL: 44 mg/dL (ref >39)
LDL Chol Calc (NIH): 63 mg/dL (ref 0–99)
Triglycerides: 118 mg/dL (ref 0–149)
VLDL Cholesterol Cal: 21 mg/dL (ref 5–40)

## 2019-09-22 LAB — CBC
Hematocrit: 41.1 % (ref 37.5–51.0)
Hemoglobin: 14.1 g/dL (ref 13.0–17.7)
MCH: 31.1 pg (ref 26.6–33.0)
MCHC: 34.3 g/dL (ref 31.5–35.7)
MCV: 91 fL (ref 79–97)
Platelets: 179 10*3/uL (ref 150–450)
RBC: 4.54 x10E6/uL (ref 4.14–5.80)
RDW: 12.9 % (ref 11.6–15.4)
WBC: 5.3 10*3/uL (ref 3.4–10.8)

## 2019-09-23 LAB — BASIC METABOLIC PANEL
BUN/Creatinine Ratio: 7 — ABNORMAL LOW (ref 10–24)
BUN: 8 mg/dL (ref 8–27)
CO2: 25 mmol/L (ref 20–29)
Calcium: 8.9 mg/dL (ref 8.6–10.2)
Chloride: 102 mmol/L (ref 96–106)
Creatinine, Ser: 1.1 mg/dL (ref 0.76–1.27)
GFR calc Af Amer: 70 mL/min/{1.73_m2} (ref >59)
GFR calc non Af Amer: 61 mL/min/{1.73_m2} (ref >59)
Glucose: 85 mg/dL (ref 65–99)
Potassium: 4.2 mmol/L (ref 3.5–5.2)
Sodium: 143 mmol/L (ref 134–144)

## 2019-12-16 ENCOUNTER — Other Ambulatory Visit: Payer: Self-pay | Admitting: Family Medicine

## 2019-12-29 ENCOUNTER — Other Ambulatory Visit: Payer: Self-pay

## 2019-12-29 ENCOUNTER — Encounter: Payer: Self-pay | Admitting: Nurse Practitioner

## 2019-12-29 ENCOUNTER — Ambulatory Visit (INDEPENDENT_AMBULATORY_CARE_PROVIDER_SITE_OTHER): Payer: PPO | Admitting: Nurse Practitioner

## 2019-12-29 VITALS — BP 118/74 | HR 95 | Temp 97.3°F | Resp 20 | Ht 71.0 in | Wt 245.0 lb

## 2019-12-29 DIAGNOSIS — N3001 Acute cystitis with hematuria: Secondary | ICD-10-CM

## 2019-12-29 DIAGNOSIS — R82998 Other abnormal findings in urine: Secondary | ICD-10-CM

## 2019-12-29 LAB — URINALYSIS, COMPLETE
Bilirubin, UA: NEGATIVE
Glucose, UA: NEGATIVE
Ketones, UA: NEGATIVE
Nitrite, UA: NEGATIVE
Protein,UA: NEGATIVE
Specific Gravity, UA: 1.025 (ref 1.005–1.030)
Urobilinogen, Ur: 2 mg/dL — ABNORMAL HIGH (ref 0.2–1.0)
pH, UA: 6.5 (ref 5.0–7.5)

## 2019-12-29 LAB — MICROSCOPIC EXAMINATION: Renal Epithel, UA: NONE SEEN /hpf

## 2019-12-29 MED ORDER — NITROFURANTOIN MONOHYD MACRO 100 MG PO CAPS: 100.0000 mg | ORAL_CAPSULE | Freq: Two times a day (BID) | ORAL | 0 refills | Status: DC

## 2019-12-29 NOTE — Assessment & Plan Note (Signed)
Urinary tract infection not well controlled, UA cultures positive for trace blood and few bacteria, patient started on Macrobid 100 mg pending urine culture. Provided eduction to patient, advised increase fluid, tylenol for pain. And follow up with uncontrolled or worsening symptoms.

## 2019-12-29 NOTE — Patient Instructions (Addendum)
Urinary tract infection not well controlled, UA cultures positive for trace blood and few bacteria, patient started on Macrobid 100 mg pending urine culture. Provided eduction to patient, advised increase fluid, tylenol for pain. And follow up with uncontrolled or worsening symptoms.    Acute Kidney Injury, Adult  Acute kidney injury is a sudden worsening of kidney function. The kidneys are organs that have several jobs. They filter the blood to remove waste products and extra fluid. They also maintain a healthy balance of minerals and hormones in the body, which helps control blood pressure and keep bones strong. With this condition, your kidneys do not do their jobs as well as they should. This condition ranges from mild to severe. Over time it may develop into long-lasting (chronic) kidney disease. Early detection and treatment may prevent acute kidney injury from developing into a chronic condition. What are the causes? Common causes of this condition include:  A problem with blood flow to the kidneys. This may be caused by: ? Low blood pressure (hypotension) or shock. ? Blood loss. ? Heart and blood vessel (cardiovascular) disease. ? Severe burns. ? Liver disease.  Direct damage to the kidneys. This may be caused by: ? Certain medicines. ? A kidney infection. ? Poisoning. ? Being around or in contact with toxic substances. ? A surgical wound. ? A hard, direct hit to the kidney area.  A sudden blockage of urine flow. This may be caused by: ? Cancer. ? Kidney stones. ? An enlarged prostate in males. What are the signs or symptoms? Symptoms of this condition may not be obvious until the condition becomes severe. Symptoms of this condition can include:  Tiredness (lethargy), or difficulty staying awake.  Nausea or vomiting.  Swelling (edema) of the face, legs, ankles, or feet.  Problems with urination, such as: ? Abdominal pain, or pain along the side of your stomach  (flank). ? Decreased urine production. ? Decrease in the force of urine flow.  Muscle twitches and cramps, especially in the legs.  Confusion or trouble concentrating.  Loss of appetite.  Fever. How is this diagnosed? This condition may be diagnosed with tests, including:  Blood tests.  Urine tests.  Imaging tests.  A test in which a sample of tissue is removed from the kidneys to be examined under a microscope (kidney biopsy). How is this treated? Treatment for this condition depends on the cause and how severe the condition is. In mild cases, treatment may not be needed. The kidneys may heal on their own. In more severe cases, treatment will involve:  Treating the cause of the kidney injury. This may involve changing any medicines you are taking or adjusting your dosage.  Fluids. You may need specialized IV fluids to balance your body's needs.  Having a catheter placed to drain urine and prevent blockages.  Preventing problems from occurring. This may mean avoiding certain medicines or procedures that can cause further injury to the kidneys. In some cases treatment may also require:  A procedure to remove toxic wastes from the body (dialysis or continuous renal replacement therapy - CRRT).  Surgery. This may be done to repair a torn kidney, or to remove the blockage from the urinary system. Follow these instructions at home: Medicines  Take over-the-counter and prescription medicines only as told by your health care provider.  Do not take any new medicines without your health care provider's approval. Many medicines can worsen your kidney damage.  Do not take any vitamin and mineral supplements  without your health care provider's approval. Many nutritional supplements can worsen your kidney damage. Lifestyle  If your health care provider prescribed changes to your diet, follow them. You may need to decrease the amount of protein you eat.  Achieve and maintain a  healthy weight. If you need help with this, ask your health care provider.  Start or continue an exercise plan. Try to exercise at least 30 minutes a day, 5 days a week.  Do not use any tobacco products, such as cigarettes, chewing tobacco, and e-cigarettes. If you need help quitting, ask your health care provider. General instructions  Keep track of your blood pressure. Report changes in your blood pressure as told by your health care provider.  Stay up to date with immunizations. Ask your health care provider which immunizations you need.  Keep all follow-up visits as told by your health care provider. This is important. Where to find more information  American Association of Kidney Patients: BombTimer.gl  National Kidney Foundation: www.kidney.[place]: https://mathis.com/  Life Options Rehabilitation Program: ? www.lifeoptions.org ? www.kidneyschool.org Contact a health care provider if:  Your symptoms get worse.  You develop new symptoms. Get help right away if:  You develop symptoms of worsening kidney disease, which include: ? Headaches. ? Abnormally dark or light skin. ? Easy bruising. ? Frequent hiccups. ? Chest pain. ? Shortness of breath. ? End of menstruation in women. ? Seizures. ? Confusion or altered mental status. ? Abdominal or back pain. ? Itchiness.  You have a fever.  Your body is producing less urine.  You have pain or bleeding when you urinate. Summary  Acute kidney injury is a sudden worsening of kidney function.  Acute kidney injury can be caused by problems with blood flow to the kidneys, direct damage to the kidneys, and sudden blockage of urine flow.  Symptoms of this condition may not be obvious until it becomes severe. Symptoms may include edema, lethargy, confusion, nausea or vomiting, and problems passing urine.  This condition can usually be diagnosed with blood tests, urine tests, and imaging tests. Sometimes a  kidney biopsy is done to diagnose this condition.  Treatment for this condition often involves treating the underlying cause. It is treated with fluids, medicines, dialysis, diet changes, or surgery. This information is not intended to replace advice given to you by your health care provider. Make sure you discuss any questions you have with your health care provider. Document Revised: 07/26/2017 Document Reviewed: 08/03/2016 Elsevier Patient Education  2020 [employer].

## 2019-12-29 NOTE — Progress Notes (Signed)
Acute Office Visit  Subjective:    Patient ID: Marcus [name], male    DOB: October 15, 1932, 86 y.o.   MRN: [id number]  Chief Complaint  Patient presents with  . Urinary Tract Infection    dark urine, low back pain, weak stream     HPI Patient is in today for acute cystitis that started about 7 days ago, patient reports lower left flank pain 2/10 that  is gradually improving. He reports dark urine with no foul odor, weak urine stream, epigastric pain but no fever, Nausea, pelvic pain, chills or fever. Patient has not used any medications to alleviate symptoms.    Past Medical History:  Diagnosis Date  . Angina   . Arthritis   . Atrial flutter ([place])   . Chronic kidney disease    hx of BPH  . Coronary atherosclerosis of native coronary artery   . Gout   . Hypertension   . Myocardial infarction ([place])   . Other and unspecified hyperlipidemia   . Pure hypercholesterolemia     Past Surgical History:  Procedure Laterality Date  . CATARACTS    . CHEST TUBE INSERTION  11/22/2011   Procedure: CHEST TUBE INSERTION;  Surgeon: Ivin Poot, MD;  Location: [place];  Service: Thoracic;  Laterality: Right;  . CORONARY ARTERY BYPASS GRAFT  11/20/2011   Procedure: CORONARY ARTERY BYPASS GRAFTING (CABG);  Surgeon: Ivin Poot, MD;  Location: [place];  Service: Open Heart Surgery;  Laterality: N/A;  Times 3. On Pump. Using endoscopically harvested right greater saphenous vein and left internal mammary artery.   Marland Kitchen ELECTROPHYSIOLOGIC STUDY N/A 06/27/2015   Procedure: Cardioversion;  Surgeon: Deboraha Sprang, MD;  Location: [place] CV LAB;  Service: Cardiovascular;  Laterality: N/A;  . LEFT AND RIGHT HEART CATHETERIZATION WITH CORONARY ANGIOGRAM N/A 11/16/2011   Procedure: LEFT AND RIGHT HEART CATHETERIZATION WITH CORONARY ANGIOGRAM;  Surgeon: Sherren Mocha, MD;  Location: [facility] CATH LAB;  Service: Cardiovascular;  Laterality: N/A;  . MAZE  11/20/2011   Procedure: MAZE;  Surgeon: Ivin Poot, MD;   Location: [place];  Service: Open Heart Surgery;  Laterality: N/A;    Family History  Problem Relation Age of Onset  . Colon cancer Son     Social History   Socioeconomic History  . Marital status: Widowed    Spouse name: [name]  . Number of children: 2  . Years of education: Not on file  . Highest education level: High school graduate  Occupational History  . Occupation: works with "heavy equipment"  Tobacco Use  . Smoking status: Never Smoker  . Smokeless tobacco: Never Used  Substance and Sexual Activity  . Alcohol use: Not Currently    Comment: hasn't had an alcoholic drink in 7 years  . Drug use: No  . Sexual activity: Never    Birth control/protection: None  Other Topics Concern  . Not on file  Social History Narrative  . Not on file   Social Determinants of Health   Financial Resource Strain: Low Risk   . Difficulty of Paying Living Expenses: Not hard at all  Food Insecurity: No Food Insecurity  . Worried About Charity fundraiser in the Last Year: Never true  . Ran Out of Food in the Last Year: Never true  Transportation Needs: No Transportation Needs  . Lack of Transportation (Medical): No  . Lack of Transportation (Non-Medical): No  Physical Activity: Inactive  . Days of Exercise per Week: 0 days  .  Minutes of Exercise per Session: 0 min  Stress: No Stress Concern Present  . Feeling of Stress : Not at all  Social Connections: Slightly Isolated  . Frequency of Communication with Friends and Family: More than three times a week  . Frequency of Social Gatherings with Friends and Family: Twice a week  . Attends Religious Services: More than 4 times per year  . Active Member of Clubs or Organizations: Yes  . Attends Archivist Meetings: More than 4 times per year  . Marital Status: Widowed  Intimate Partner Violence: Not At Risk  . Fear of Current or Ex-Partner: No  . Emotionally Abused: No  . Physically Abused: No  . Sexually Abused: No     Outpatient Medications Prior to Visit  Medication Sig Dispense Refill  . allopurinol (ZYLOPRIM) 300 MG tablet Take 1 tablet (300 mg total) by mouth daily. (Needs to be seen before next refill) 90 tablet 3  . amLODipine (NORVASC) 10 MG tablet Take 1 tablet (10 mg total) by mouth daily. (Needs to be seen before next refill) 30 tablet 0  . atorvastatin (LIPITOR) 80 MG tablet Take 1 tablet (80 mg total) by mouth daily. (Needs to be seen before next refill) 90 tablet 3  . doxazosin (CARDURA) 4 MG tablet Take 2 mg by mouth daily after supper.    . ferrous gluconate (FERGON) 324 MG tablet Take 324 mg by mouth 2 (two) times daily with a meal.    . furosemide (LASIX) 40 MG tablet Take 1 tablet (40 mg total) by mouth daily. (Needs to be seen before next refill) 90 tablet 3  . metoprolol tartrate (LOPRESSOR) 25 MG tablet 25 mg. Take 1/2 tablet by mouth twice daily    . potassium chloride SA (KLOR-CON M20) 20 MEQ tablet Take 1 tablet (20 mEq total) by mouth 2 (two) times daily. (Needs to be seen before next refill) 180 tablet 3  . rivaroxaban (XARELTO) 20 MG TABS tablet Take 1 tablet (20 mg total) by mouth daily with supper. (Needs to be seen before next refill) 90 tablet 3   No facility-administered medications prior to visit.    No Known Allergies  Review of Systems  Constitutional: Negative for activity change, appetite change and fever.  Respiratory: Negative for chest tightness and shortness of breath.   Gastrointestinal: Negative for abdominal distention, abdominal pain, nausea and vomiting.  Genitourinary: Positive for decreased urine volume, difficulty urinating and flank pain. Negative for urgency.       Left lower flank pain  Musculoskeletal: Positive for back pain. Negative for arthralgias, gait problem and myalgias.  Skin: Negative for color change and rash.       Objective:    Physical Exam Constitutional:      Appearance: Normal appearance.  Eyes:     Conjunctiva/sclera:  Conjunctivae normal.  Cardiovascular:     Rate and Rhythm: Normal rate and regular rhythm.     Pulses: Normal pulses.     Heart sounds: Normal heart sounds.  Pulmonary:     Effort: Pulmonary effort is normal.     Breath sounds: Normal breath sounds.  Abdominal:     Palpations: Abdomen is soft.     Tenderness: There is left CVA tenderness.  Musculoskeletal:     Cervical back: Neck supple.  Skin:    General: Skin is warm.     Findings: No rash.  Neurological:     Mental Status: He is alert and oriented to person, place, and  time.     Motor: No weakness.  Psychiatric:        Mood and Affect: Mood normal.        Behavior: Behavior normal.     BP 118/74   Pulse 95   Temp (!) 97.3 F (36.3 C)   Resp 20   Ht 5\' 11"  (1.803 m)   Wt 245 lb (111.1 kg)   SpO2 94%   BMI 34.17 kg/m  Wt Readings from Last 3 Encounters:  12/29/19 245 lb (111.1 kg)  09/02/19 246 lb (111.6 kg)  08/01/18 258 lb 12.8 oz (117.4 kg)    Health Maintenance Due  Topic Date Due  . COVID-19 Vaccine (2 - Moderna 2-dose series) 11/25/2019       Lab Results  Component Value Date   TSH 4.300 01/30/2018   Lab Results  Component Value Date   WBC 5.3 09/22/2019   HGB 14.1 09/22/2019   HCT 41.1 09/22/2019   MCV 91 09/22/2019   PLT 179 09/22/2019   Lab Results  Component Value Date   NA 143 09/22/2019   K 4.2 09/22/2019   CO2 25 09/22/2019   GLUCOSE 85 09/22/2019   BUN 8 09/22/2019   CREATININE 1.10 09/22/2019   BILITOT 0.9 01/30/2018   ALKPHOS 110 01/30/2018   AST 14 01/30/2018   ALT 9 01/30/2018   PROT 6.6 01/30/2018   ALBUMIN 4.1 01/30/2018   CALCIUM 8.9 09/22/2019   ANIONGAP 9 07/22/2018   Lab Results  Component Value Date   CHOL 128 09/22/2019   Lab Results  Component Value Date   HDL 44 09/22/2019   Lab Results  Component Value Date   LDLCALC 63 09/22/2019   Lab Results  Component Value Date   TRIG 118 09/22/2019   Lab Results  Component Value Date   CHOLHDL 2.9  09/22/2019   Lab Results  Component Value Date   HGBA1C 5.8 (H) 11/19/2011       Assessment & Plan:   Problem List Items Addressed This Visit    None    Visit Diagnoses    Dark urine    -  Primary   Relevant Orders   Urinalysis, Complete   Urine Culture       No orders of the defined types were placed in this encounter.    Ivy Lynn, NP

## 2019-12-30 LAB — URINE CULTURE

## 2020-01-16 ENCOUNTER — Other Ambulatory Visit: Payer: Self-pay | Admitting: Family Medicine

## 2020-01-17 ENCOUNTER — Other Ambulatory Visit: Payer: Self-pay | Admitting: Family Medicine

## 2020-01-20 ENCOUNTER — Other Ambulatory Visit: Payer: Self-pay | Admitting: Family Medicine

## 2020-01-20 NOTE — Telephone Encounter (Signed)
Dettinger. NTBS 30 days given 12/16/19

## 2020-01-22 NOTE — Telephone Encounter (Signed)
Attempted to contact patient to schedule apt- NA

## 2020-01-26 ENCOUNTER — Other Ambulatory Visit: Payer: Self-pay | Admitting: Family Medicine

## 2020-01-29 ENCOUNTER — Other Ambulatory Visit: Payer: Self-pay | Admitting: Family Medicine

## 2020-02-17 ENCOUNTER — Other Ambulatory Visit: Payer: Self-pay | Admitting: Family Medicine

## 2020-02-17 NOTE — Telephone Encounter (Signed)
Dettinger NTBS 30 days given 01/18/20

## 2020-02-19 NOTE — Telephone Encounter (Signed)
No answer, no voicemail.

## 2020-02-21 ENCOUNTER — Other Ambulatory Visit: Payer: Self-pay | Admitting: Family Medicine

## 2020-03-02 ENCOUNTER — Telehealth: Payer: Self-pay | Admitting: Family Medicine

## 2020-03-03 ENCOUNTER — Other Ambulatory Visit: Payer: Self-pay | Admitting: Cardiology

## 2020-03-08 ENCOUNTER — Other Ambulatory Visit: Payer: Self-pay

## 2020-03-08 ENCOUNTER — Ambulatory Visit: Payer: Self-pay | Admitting: Family Medicine

## 2020-03-08 ENCOUNTER — Other Ambulatory Visit: Payer: Self-pay | Admitting: Family Medicine

## 2020-03-08 ENCOUNTER — Encounter: Payer: Self-pay | Admitting: Family Medicine

## 2020-03-08 VITALS — BP 137/77 | HR 74 | Temp 98.1°F | Resp 20 | Ht 71.0 in | Wt 246.0 lb

## 2020-03-08 DIAGNOSIS — Z0289 Encounter for other administrative examinations: Secondary | ICD-10-CM

## 2020-03-08 DIAGNOSIS — Z024 Encounter for examination for driving license: Secondary | ICD-10-CM

## 2020-03-08 LAB — URINALYSIS
Bilirubin, UA: NEGATIVE
Glucose, UA: NEGATIVE
Ketones, UA: NEGATIVE
Nitrite, UA: NEGATIVE
Specific Gravity, UA: 1.025 (ref 1.005–1.030)
Urobilinogen, Ur: 1 mg/dL (ref 0.2–1.0)
pH, UA: 5.5 (ref 5.0–7.5)

## 2020-03-08 NOTE — Progress Notes (Signed)
Subjective:  Patient ID: Marcus [name], male    DOB: 1932/10/19  Age: 86 y.o. MRN: [id number]  CC: DOT physical   HPI Marcus [name] presents for DOT exam history of coronary bypass grafting 7 years ago.  History of hypertension.  He wears hearing aids and glasses.  Depression screen [facility] 2/9 03/08/2020 12/29/2019 01/15/2019  Decreased Interest 0 0 0  Down, Depressed, Hopeless 0 0 0  PHQ - 2 Score 0 0 0    History Marcus [name] has a past medical history of Angina, Arthritis, Atrial flutter ([place]), Chronic kidney disease, Coronary atherosclerosis of native coronary artery, Gout, Hypertension, Myocardial infarction ([place]), Other and unspecified hyperlipidemia, and Pure hypercholesterolemia.   He has a past surgical history that includes CATARACTS; Coronary artery bypass graft (11/20/2011); MAZE (11/20/2011); Chest tube insertion (11/22/2011); left and right heart catheterization with coronary angiogram (N/A, 11/16/2011); and Cardiac catheterization (N/A, 06/27/2015).   His family history includes Colon cancer in his son.He reports that he has never smoked. He has never used smokeless tobacco. He reports previous alcohol use. He reports that he does not use drugs.    ROS Review of Systems  Constitutional: Negative.   HENT: Negative.   Eyes: Negative for visual disturbance.  Respiratory: Negative for cough and shortness of breath.   Cardiovascular: Negative for chest pain and leg swelling.  Gastrointestinal: Negative for abdominal pain, diarrhea, nausea and vomiting.  Genitourinary: Negative for difficulty urinating.  Musculoskeletal: Negative for arthralgias and myalgias.  Skin: Negative for rash.  Neurological: Negative for headaches.  Psychiatric/Behavioral: Negative for sleep disturbance.    Objective:  BP 137/77   Pulse 74   Temp 98.1 F (36.7 C) (Temporal)   Resp 20   Ht 5\' 11"  (1.803 m)   Wt 246 lb (111.6 kg)   SpO2 98%   BMI 34.31 kg/m   BP Readings from Last 3 Encounters:    03/08/20 137/77  12/29/19 118/74  09/02/19 122/80    Wt Readings from Last 3 Encounters:  03/08/20 246 lb (111.6 kg)  12/29/19 245 lb (111.1 kg)  09/02/19 246 lb (111.6 kg)     Physical Exam Constitutional:      General: He is not in acute distress.    Appearance: He is well-developed.  HENT:     Head: Normocephalic and atraumatic.     Right Ear: External ear normal.     Left Ear: External ear normal.     Nose: Nose normal.  Eyes:     Conjunctiva/sclera: Conjunctivae normal.     Pupils: Pupils are equal, round, and reactive to light.  Cardiovascular:     Rate and Rhythm: Normal rate and regular rhythm.     Heart sounds: Normal heart sounds. No murmur heard.   Pulmonary:     Effort: Pulmonary effort is normal. No respiratory distress.     Breath sounds: Normal breath sounds. No wheezing or rales.  Abdominal:     Palpations: Abdomen is soft.     Tenderness: There is no abdominal tenderness.  Musculoskeletal:        General: Normal range of motion.     Cervical back: Normal range of motion and neck supple.  Skin:    General: Skin is warm and dry.  Neurological:     Mental Status: He is alert and oriented to person, place, and time.     Deep Tendon Reflexes: Reflexes are normal and symmetric.  Psychiatric:        Behavior: Behavior normal.  Thought Content: Thought content normal.        Judgment: Judgment normal.       Assessment & Plan:   Marcus [name] was seen today for dot physical.  Diagnoses and all orders for this visit:  Encounter for examination required by Department of Transportation (DOT) -     Urinalysis     Exercise stress test recommended.  I have discontinued Cedar L. Going's nitrofurantoin (macrocrystal-monohydrate). I am also having him maintain his doxazosin, ferrous gluconate, furosemide, atorvastatin, potassium chloride SA, Xarelto, amLODipine, allopurinol, and metoprolol tartrate.  Allergies as of 03/08/2020   No Known Allergies      Medication List       Accurate as of March 08, 2020  2:05 PM. If you have any questions, ask your nurse or doctor.        STOP taking these medications   nitrofurantoin (macrocrystal-monohydrate) 100 MG capsule Commonly known as: Macrobid Stopped by: Claretta Fraise, MD     TAKE these medications   allopurinol 300 MG tablet Commonly known as: ZYLOPRIM TAKE 1 TABLET (300 MG TOTAL) BY MOUTH DAILY. (NEEDS TO BE SEEN BEFORE NEXT REFILL)   amLODipine 10 MG tablet Commonly known as: NORVASC Take 1 tablet (10 mg total) by mouth daily.   atorvastatin 80 MG tablet Commonly known as: LIPITOR Take 1 tablet (80 mg total) by mouth daily.   doxazosin 4 MG tablet Commonly known as: CARDURA Take 2 mg by mouth daily after supper.   ferrous gluconate 324 MG tablet Commonly known as: FERGON Take 324 mg by mouth 2 (two) times daily with a meal.   furosemide 40 MG tablet Commonly known as: LASIX Take 1 tablet (40 mg total) by mouth daily. (Needs to be seen before next refill)   metoprolol tartrate 25 MG tablet Commonly known as: LOPRESSOR TAKE 1 TABLET BY MOUTH TWICE A DAY   potassium chloride SA 20 MEQ tablet Commonly known as: Klor-Con M20 Take 1 tablet (20 mEq total) by mouth 2 (two) times daily.   Xarelto 20 MG Tabs tablet Generic drug: rivaroxaban TAKE 1 TABLET (20 MG TOTAL) BY MOUTH DAILY WITH SUPPER. (NEEDS TO BE SEEN BEFORE NEXT REFILL)        Follow-up: Return in about 1 year (around 03/08/2021).  Claretta Fraise, M.D.

## 2020-03-14 ENCOUNTER — Ambulatory Visit (INDEPENDENT_AMBULATORY_CARE_PROVIDER_SITE_OTHER): Payer: PPO | Admitting: Family Medicine

## 2020-03-14 ENCOUNTER — Other Ambulatory Visit: Payer: Self-pay

## 2020-03-14 ENCOUNTER — Encounter: Payer: Self-pay | Admitting: Family Medicine

## 2020-03-14 VITALS — BP 116/78 | HR 81 | Temp 98.5°F | Ht 71.0 in | Wt 246.0 lb

## 2020-03-14 DIAGNOSIS — I2510 Atherosclerotic heart disease of native coronary artery without angina pectoris: Secondary | ICD-10-CM

## 2020-03-14 DIAGNOSIS — E7800 Pure hypercholesterolemia, unspecified: Secondary | ICD-10-CM | POA: Diagnosis not present

## 2020-03-14 DIAGNOSIS — I480 Paroxysmal atrial fibrillation: Secondary | ICD-10-CM | POA: Diagnosis not present

## 2020-03-14 DIAGNOSIS — I10 Essential (primary) hypertension: Secondary | ICD-10-CM | POA: Diagnosis not present

## 2020-03-14 MED ORDER — POTASSIUM CHLORIDE CRYS ER 20 MEQ PO TBCR: 20.0000 meq | EXTENDED_RELEASE_TABLET | Freq: Two times a day (BID) | ORAL | 3 refills | Status: DC

## 2020-03-14 MED ORDER — FUROSEMIDE 40 MG PO TABS: 40.0000 mg | ORAL_TABLET | Freq: Every day | ORAL | 3 refills | Status: DC

## 2020-03-14 MED ORDER — ATORVASTATIN CALCIUM 80 MG PO TABS: 80.0000 mg | ORAL_TABLET | Freq: Every day | ORAL | 3 refills | Status: DC

## 2020-03-14 MED ORDER — ALLOPURINOL 300 MG PO TABS: 300.0000 mg | ORAL_TABLET | Freq: Every day | ORAL | 3 refills | Status: DC

## 2020-03-14 MED ORDER — RIVAROXABAN 20 MG PO TABS: ORAL_TABLET | ORAL | 3 refills | Status: DC

## 2020-03-14 MED ORDER — AMLODIPINE BESYLATE 10 MG PO TABS: 10.0000 mg | ORAL_TABLET | Freq: Every day | ORAL | 3 refills | Status: DC

## 2020-03-14 NOTE — Progress Notes (Signed)
BP 116/78   Pulse 81   Temp 98.5 F (36.9 C)   Ht '5\' 11"'  (1.803 m)   Wt 246 lb (111.6 kg)   SpO2 95%   BMI 34.31 kg/m    Subjective:   Patient ID: Marcus [name], male    DOB: 1933-01-03, 86 y.o.   MRN: [id number]  HPI: PLACIDO HANGARTNER is a 86 y.o. male presenting on 03/14/2020 for No chief complaint on file.   HPI Hypertension and CAD status post CABG Patient is currently on doxazosin and metoprolol and amlodipine and furosemide, and their blood pressure today is 116/78. Patient denies any lightheadedness or dizziness. Patient denies headaches, blurred vision, chest pains, shortness of breath, or weakness. Denies any side effects from medication and is content with current medication.  Patient is also on Xarelto for A. fib and blood thinner  Hyperlipidemia Patient is coming in for recheck of his hyperlipidemia. The patient is currently taking atorvastatin. They deny any issues with myalgias or history of liver damage from it. They deny any focal numbness or weakness or chest pain.   Xarelto recheck Reason on anticoagulation: Paroxysmal A. fib Patient denies any bruising or bleeding or chest pain or palpitations   Relevant past medical, surgical, family and social history reviewed and updated as indicated. Interim medical history since our last visit reviewed. Allergies and medications reviewed and updated.  Review of Systems  Constitutional: Negative for chills and fever.  Eyes: Negative for discharge.  Respiratory: Negative for shortness of breath and wheezing.   Cardiovascular: Positive for leg swelling. Negative for chest pain and palpitations.  Musculoskeletal: Negative for back pain and gait problem.  Skin: Negative for rash.  Neurological: Negative for dizziness, weakness and light-headedness.  All other systems reviewed and are negative.   Per HPI unless specifically indicated above   Allergies as of 03/14/2020   No Known Allergies     Medication List        Accurate as of March 14, 2020 10:59 AM. If you have any questions, ask your nurse or doctor.        allopurinol 300 MG tablet Commonly known as: ZYLOPRIM TAKE 1 TABLET (300 MG TOTAL) BY MOUTH DAILY. (NEEDS TO BE SEEN BEFORE NEXT REFILL)   amLODipine 10 MG tablet Commonly known as: NORVASC Take 1 tablet (10 mg total) by mouth daily.   atorvastatin 80 MG tablet Commonly known as: LIPITOR Take 1 tablet (80 mg total) by mouth daily.   doxazosin 4 MG tablet Commonly known as: CARDURA Take 2 mg by mouth daily after supper.   ferrous gluconate 324 MG tablet Commonly known as: FERGON Take 324 mg by mouth 2 (two) times daily with a meal.   furosemide 40 MG tablet Commonly known as: LASIX Take 1 tablet (40 mg total) by mouth daily. (Needs to be seen before next refill)   metoprolol tartrate 25 MG tablet Commonly known as: LOPRESSOR TAKE 1 TABLET BY MOUTH TWICE A DAY   potassium chloride SA 20 MEQ tablet Commonly known as: Klor-Con M20 Take 1 tablet (20 mEq total) by mouth 2 (two) times daily.   Xarelto 20 MG Tabs tablet Generic drug: rivaroxaban TAKE 1 TABLET (20 MG TOTAL) BY MOUTH DAILY WITH SUPPER. (NEEDS TO BE SEEN BEFORE NEXT REFILL)        Objective:   BP 116/78   Pulse 81   Temp 98.5 F (36.9 C)   Ht '5\' 11"'  (1.803 m)   Wt 246 lb (111.6  kg)   SpO2 95%   BMI 34.31 kg/m   Wt Readings from Last 3 Encounters:  03/14/20 246 lb (111.6 kg)  03/08/20 246 lb (111.6 kg)  12/29/19 245 lb (111.1 kg)    Physical Exam Vitals and nursing note reviewed.  Constitutional:      General: He is not in acute distress.    Appearance: He is well-developed. He is not diaphoretic.  Eyes:     General: No scleral icterus.    Conjunctiva/sclera: Conjunctivae normal.  Neck:     Thyroid: No thyromegaly.  Cardiovascular:     Rate and Rhythm: Normal rate. Rhythm irregular.     Heart sounds: Normal heart sounds. No murmur heard.   Pulmonary:     Effort: Pulmonary effort is normal.  No respiratory distress.     Breath sounds: Normal breath sounds. No wheezing.  Musculoskeletal:        General: Swelling (2+ bilateral lower extremity edema) present. Normal range of motion.     Cervical back: Neck supple.  Lymphadenopathy:     Cervical: No cervical adenopathy.  Skin:    General: Skin is warm and dry.     Findings: No rash.  Neurological:     Mental Status: He is alert and oriented to person, place, and time.     Coordination: Coordination normal.  Psychiatric:        Behavior: Behavior normal.       Assessment & Plan:   Problem List Items Addressed This Visit      Cardiovascular and Mediastinum   CORONARY ATHEROSCLEROSIS NATIVE CORONARY ARTERY   Relevant Medications   rivaroxaban (XARELTO) 20 MG TABS tablet   amLODipine (NORVASC) 10 MG tablet   atorvastatin (LIPITOR) 80 MG tablet   furosemide (LASIX) 40 MG tablet   Other Relevant Orders   CBC with Differential/Platelet   HTN (hypertension) - Primary   Relevant Medications   rivaroxaban (XARELTO) 20 MG TABS tablet   amLODipine (NORVASC) 10 MG tablet   atorvastatin (LIPITOR) 80 MG tablet   furosemide (LASIX) 40 MG tablet   Other Relevant Orders   CMP14+EGFR   Paroxysmal atrial fibrillation (HCC)   Relevant Medications   rivaroxaban (XARELTO) 20 MG TABS tablet   amLODipine (NORVASC) 10 MG tablet   atorvastatin (LIPITOR) 80 MG tablet   furosemide (LASIX) 40 MG tablet   Other Relevant Orders   CBC with Differential/Platelet   TSH     Other   Pure hypercholesterolemia   Relevant Medications   rivaroxaban (XARELTO) 20 MG TABS tablet   amLODipine (NORVASC) 10 MG tablet   atorvastatin (LIPITOR) 80 MG tablet   furosemide (LASIX) 40 MG tablet   Other Relevant Orders   Lipid panel      Continue current medication, will check blood work, patient continues to follow-up with cardiology for his heart. Follow up plan: Return in about 6 months (around 09/14/2020), or if symptoms worsen or fail to  improve.  Counseling provided for all of the vaccine components No orders of the defined types were placed in this encounter.   Caryl Pina, MD [facility] 03/14/2020, 10:59 AM

## 2020-03-15 ENCOUNTER — Other Ambulatory Visit: Payer: Self-pay

## 2020-03-15 DIAGNOSIS — R7989 Other specified abnormal findings of blood chemistry: Secondary | ICD-10-CM

## 2020-03-15 LAB — CBC WITH DIFFERENTIAL/PLATELET
Basophils Absolute: 0 10*3/uL (ref 0.0–0.2)
Basos: 1 %
EOS (ABSOLUTE): 0.2 10*3/uL (ref 0.0–0.4)
Eos: 4 %
Hematocrit: 40.6 % (ref 37.5–51.0)
Hemoglobin: 13.5 g/dL (ref 13.0–17.7)
Immature Grans (Abs): 0 10*3/uL (ref 0.0–0.1)
Immature Granulocytes: 0 %
Lymphocytes Absolute: 1.6 10*3/uL (ref 0.7–3.1)
Lymphs: 28 %
MCH: 30.5 pg (ref 26.6–33.0)
MCHC: 33.3 g/dL (ref 31.5–35.7)
MCV: 92 fL (ref 79–97)
Monocytes Absolute: 0.6 10*3/uL (ref 0.1–0.9)
Monocytes: 11 %
Neutrophils Absolute: 3.2 10*3/uL (ref 1.4–7.0)
Neutrophils: 56 %
Platelets: 180 10*3/uL (ref 150–450)
RBC: 4.42 x10E6/uL (ref 4.14–5.80)
RDW: 13.1 % (ref 11.6–15.4)
WBC: 5.8 10*3/uL (ref 3.4–10.8)

## 2020-03-15 LAB — CMP14+EGFR
ALT: 6 IU/L (ref 0–44)
AST: 14 IU/L (ref 0–40)
Albumin/Globulin Ratio: 1.6 (ref 1.2–2.2)
Albumin: 4.1 g/dL (ref 3.6–4.6)
Alkaline Phosphatase: 126 IU/L — ABNORMAL HIGH (ref 48–121)
BUN/Creatinine Ratio: 12 (ref 10–24)
BUN: 12 mg/dL (ref 8–27)
Bilirubin Total: 0.9 mg/dL (ref 0.0–1.2)
CO2: 23 mmol/L (ref 20–29)
Calcium: 8.8 mg/dL (ref 8.6–10.2)
Chloride: 101 mmol/L (ref 96–106)
Creatinine, Ser: 1 mg/dL (ref 0.76–1.27)
GFR calc Af Amer: 79 mL/min/{1.73_m2} (ref >59)
GFR calc non Af Amer: 68 mL/min/{1.73_m2} (ref >59)
Globulin, Total: 2.6 g/dL (ref 1.5–4.5)
Glucose: 80 mg/dL (ref 65–99)
Potassium: 4.5 mmol/L (ref 3.5–5.2)
Sodium: 138 mmol/L (ref 134–144)
Total Protein: 6.7 g/dL (ref 6.0–8.5)

## 2020-03-15 LAB — LIPID PANEL
Chol/HDL Ratio: 3.1 ratio (ref 0.0–5.0)
Cholesterol, Total: 133 mg/dL (ref 100–199)
HDL: 43 mg/dL (ref >39)
LDL Chol Calc (NIH): 73 mg/dL (ref 0–99)
Triglycerides: 90 mg/dL (ref 0–149)
VLDL Cholesterol Cal: 17 mg/dL (ref 5–40)

## 2020-03-15 LAB — TSH: TSH: 7.16 u[IU]/mL — ABNORMAL HIGH (ref 0.450–4.500)

## 2020-04-19 ENCOUNTER — Other Ambulatory Visit: Payer: Self-pay

## 2020-04-19 ENCOUNTER — Other Ambulatory Visit: Payer: PPO

## 2020-04-19 DIAGNOSIS — R7989 Other specified abnormal findings of blood chemistry: Secondary | ICD-10-CM

## 2020-04-20 LAB — TSH: TSH: 6.64 u[IU]/mL — ABNORMAL HIGH (ref 0.450–4.500)

## 2020-04-21 ENCOUNTER — Other Ambulatory Visit: Payer: Self-pay

## 2020-04-21 MED ORDER — LEVOTHYROXINE SODIUM 50 MCG PO TABS
50.0000 ug | ORAL_TABLET | Freq: Every day | ORAL | 1 refills | Status: DC
Start: 2020-04-21 — End: 2020-09-14

## 2020-09-14 ENCOUNTER — Other Ambulatory Visit: Payer: Self-pay

## 2020-09-14 ENCOUNTER — Encounter: Payer: Self-pay | Admitting: Family Medicine

## 2020-09-14 ENCOUNTER — Ambulatory Visit (INDEPENDENT_AMBULATORY_CARE_PROVIDER_SITE_OTHER): Payer: PPO | Admitting: Family Medicine

## 2020-09-14 VITALS — BP 115/67 | HR 122 | Ht 71.0 in | Wt 246.0 lb

## 2020-09-14 DIAGNOSIS — I480 Paroxysmal atrial fibrillation: Secondary | ICD-10-CM | POA: Diagnosis not present

## 2020-09-14 DIAGNOSIS — I4892 Unspecified atrial flutter: Secondary | ICD-10-CM

## 2020-09-14 DIAGNOSIS — I10 Essential (primary) hypertension: Secondary | ICD-10-CM

## 2020-09-14 DIAGNOSIS — E7800 Pure hypercholesterolemia, unspecified: Secondary | ICD-10-CM | POA: Diagnosis not present

## 2020-09-14 DIAGNOSIS — E039 Hypothyroidism, unspecified: Secondary | ICD-10-CM

## 2020-09-14 MED ORDER — LEVOTHYROXINE SODIUM 50 MCG PO TABS: 50.0000 ug | ORAL_TABLET | Freq: Every day | ORAL | 1 refills | Status: DC

## 2020-09-14 NOTE — Progress Notes (Signed)
BP 115/67   Pulse (!) 122   Ht '5\' 11"'  (1.803 m)   Wt 246 lb (111.6 kg)   SpO2 96%   BMI 34.31 kg/m    Subjective:   Patient ID: Marcus [name], male    DOB: [birth year], 87 y.o.   MRN: [id number]  HPI: Marcus [name] is a 87 y.o. male presenting on 09/14/2020 for Medical Management of Chronic Issues and Hypertension   HPI Hypertension Patient is currently on amlodipine and doxazosin and furosemide and metoprolol, and their blood pressure today is 115/60. Patient denies any lightheadedness or dizziness. Patient denies headaches, blurred vision, chest pains, shortness of breath, or weakness. Denies any side effects from medication and is content with current medication.   Hyperlipidemia Patient is coming in for recheck of his hyperlipidemia. The patient is currently taking Lipitor. They deny any issues with myalgias or history of liver damage from it. They deny any focal numbness or weakness or chest pain.   Hypothyroidism recheck Patient is coming in for thyroid recheck today as well. They deny any issues with hair changes or heat or cold problems or diarrhea or constipation. They deny any chest pain or palpitations. They are currently on levothyroxine 50 micrograms   A. fib recheck Patient is currently on Xarelto for A. fib.  Relevant past medical, surgical, family and social history reviewed and updated as indicated. Interim medical history since our last visit reviewed. Allergies and medications reviewed and updated.  Review of Systems  Constitutional: Negative for chills and fever.  Respiratory: Negative for shortness of breath and wheezing.   Cardiovascular: Negative for chest pain, palpitations and leg swelling.  Musculoskeletal: Negative for back pain and gait problem.  Skin: Negative for rash.  All other systems reviewed and are negative.   Per HPI unless specifically indicated above   Allergies as of 09/14/2020   No Known Allergies     Medication List        Accurate as of September 14, 2020 10:55 AM. If you have any questions, ask your nurse or doctor.        allopurinol 300 MG tablet Commonly known as: ZYLOPRIM Take 1 tablet (300 mg total) by mouth daily.   amLODipine 10 MG tablet Commonly known as: NORVASC Take 1 tablet (10 mg total) by mouth daily.   atorvastatin 80 MG tablet Commonly known as: LIPITOR Take 1 tablet (80 mg total) by mouth daily.   doxazosin 4 MG tablet Commonly known as: CARDURA Take 2 mg by mouth daily after supper.   ferrous gluconate 324 MG tablet Commonly known as: FERGON Take 324 mg by mouth 2 (two) times daily with a meal.   furosemide 40 MG tablet Commonly known as: LASIX Take 1 tablet (40 mg total) by mouth daily.   levothyroxine 50 MCG tablet Commonly known as: SYNTHROID Take 1 tablet (50 mcg total) by mouth daily.   metoprolol tartrate 25 MG tablet Commonly known as: LOPRESSOR TAKE 1 TABLET BY MOUTH TWICE A DAY   potassium chloride SA 20 MEQ tablet Commonly known as: Klor-Con M20 Take 1 tablet (20 mEq total) by mouth 2 (two) times daily.   rivaroxaban 20 MG Tabs tablet Commonly known as: Xarelto TAKE 1 TABLET (20 MG TOTAL) BY MOUTH DAILY WITH SUPPER        Objective:   BP 115/67   Pulse (!) 122   Ht '5\' 11"'  (1.803 m)   Wt 246 lb (111.6 kg)   SpO2 96%  BMI 34.31 kg/m   Wt Readings from Last 3 Encounters:  09/14/20 246 lb (111.6 kg)  03/14/20 246 lb (111.6 kg)  03/08/20 246 lb (111.6 kg)    Physical Exam Vitals and nursing note reviewed.  Constitutional:      General: He is not in acute distress.    Appearance: He is well-developed and well-nourished. He is not diaphoretic.  Eyes:     General: No scleral icterus.    Extraocular Movements: EOM normal.     Conjunctiva/sclera: Conjunctivae normal.  Neck:     Thyroid: No thyromegaly.  Cardiovascular:     Rate and Rhythm: Normal rate and regular rhythm.     Pulses: Intact distal pulses.     Heart sounds: Normal heart  sounds. No murmur heard.   Pulmonary:     Effort: Pulmonary effort is normal. No respiratory distress.     Breath sounds: Normal breath sounds. No wheezing.  Musculoskeletal:        General: Swelling (Minimal swelling, wearing compression stockings today.) present. No edema. Normal range of motion.     Cervical back: Neck supple.  Lymphadenopathy:     Cervical: No cervical adenopathy.  Skin:    General: Skin is warm and dry.     Findings: No rash.  Neurological:     Mental Status: He is alert and oriented to person, place, and time.     Coordination: Coordination normal.  Psychiatric:        Mood and Affect: Mood and affect normal.        Behavior: Behavior normal.       Assessment & Plan:   Problem List Items Addressed This Visit      Cardiovascular and Mediastinum   HTN (hypertension)   Paroxysmal atrial fibrillation (HCC)   Atrial flutter (HCC)     Other   Pure hypercholesterolemia   Relevant Orders   CBC with Differential/Platelet   CMP14+EGFR   Lipid panel   TSH    Other Visit Diagnoses    Essential hypertension    -  Primary   Relevant Orders   CBC with Differential/Platelet   CMP14+EGFR   Lipid panel   TSH   Hypothyroidism, unspecified type       Relevant Medications   levothyroxine (SYNTHROID) 50 MCG tablet      Thyroid and cholesterol hypertension recheck  Doing well, no medication changes, will see her blood work falls. Follow up plan: Return in about 6 months (around 03/14/2021), or if symptoms worsen or fail to improve, for Hypertension and cholesterol.  Counseling provided for all of the vaccine components Orders Placed This Encounter  Procedures  . CBC with Differential/Platelet  . CMP14+EGFR  . Lipid panel  . TSH    Caryl Pina, MD [facility] 09/14/2020, 10:55 AM

## 2020-09-15 LAB — CMP14+EGFR
ALT: 8 IU/L (ref 0–44)
AST: 16 IU/L (ref 0–40)
Albumin/Globulin Ratio: 1.4 (ref 1.2–2.2)
Albumin: 3.8 g/dL (ref 3.6–4.6)
Alkaline Phosphatase: 122 IU/L — ABNORMAL HIGH (ref 44–121)
BUN/Creatinine Ratio: 10 (ref 10–24)
BUN: 11 mg/dL (ref 8–27)
Bilirubin Total: 0.9 mg/dL (ref 0.0–1.2)
CO2: 26 mmol/L (ref 20–29)
Calcium: 8.8 mg/dL (ref 8.6–10.2)
Chloride: 101 mmol/L (ref 96–106)
Creatinine, Ser: 1.1 mg/dL (ref 0.76–1.27)
GFR calc Af Amer: 70 mL/min/{1.73_m2} (ref >59)
GFR calc non Af Amer: 60 mL/min/{1.73_m2} (ref >59)
Globulin, Total: 2.7 g/dL (ref 1.5–4.5)
Glucose: 112 mg/dL — ABNORMAL HIGH (ref 65–99)
Potassium: 3.8 mmol/L (ref 3.5–5.2)
Sodium: 139 mmol/L (ref 134–144)
Total Protein: 6.5 g/dL (ref 6.0–8.5)

## 2020-09-15 LAB — LIPID PANEL
Chol/HDL Ratio: 2.9 ratio (ref 0.0–5.0)
Cholesterol, Total: 129 mg/dL (ref 100–199)
HDL: 44 mg/dL (ref >39)
LDL Chol Calc (NIH): 68 mg/dL (ref 0–99)
Triglycerides: 90 mg/dL (ref 0–149)
VLDL Cholesterol Cal: 17 mg/dL (ref 5–40)

## 2020-09-15 LAB — CBC WITH DIFFERENTIAL/PLATELET
Basophils Absolute: 0 10*3/uL (ref 0.0–0.2)
Basos: 1 %
EOS (ABSOLUTE): 0.2 10*3/uL (ref 0.0–0.4)
Eos: 4 %
Hematocrit: 39.1 % (ref 37.5–51.0)
Hemoglobin: 13.1 g/dL (ref 13.0–17.7)
Immature Grans (Abs): 0 10*3/uL (ref 0.0–0.1)
Immature Granulocytes: 0 %
Lymphocytes Absolute: 2 10*3/uL (ref 0.7–3.1)
Lymphs: 36 %
MCH: 30 pg (ref 26.6–33.0)
MCHC: 33.5 g/dL (ref 31.5–35.7)
MCV: 90 fL (ref 79–97)
Monocytes Absolute: 0.5 10*3/uL (ref 0.1–0.9)
Monocytes: 9 %
Neutrophils Absolute: 2.8 10*3/uL (ref 1.4–7.0)
Neutrophils: 50 %
Platelets: 167 10*3/uL (ref 150–450)
RBC: 4.36 x10E6/uL (ref 4.14–5.80)
RDW: 12.9 % (ref 11.6–15.4)
WBC: 5.6 10*3/uL (ref 3.4–10.8)

## 2020-09-15 LAB — TSH: TSH: 3.51 u[IU]/mL (ref 0.450–4.500)

## 2020-09-30 DIAGNOSIS — H40033 Anatomical narrow angle, bilateral: Secondary | ICD-10-CM | POA: Diagnosis not present

## 2020-09-30 DIAGNOSIS — H04123 Dry eye syndrome of bilateral lacrimal glands: Secondary | ICD-10-CM | POA: Diagnosis not present

## 2021-01-31 DIAGNOSIS — E785 Hyperlipidemia, unspecified: Secondary | ICD-10-CM | POA: Insufficient documentation

## 2021-01-31 NOTE — Progress Notes (Signed)
Cardiology Office Note   Date:  02/01/2021   ID:  Marcus [name], DOB 10/16/1932, MRN [id number]  PCP:  Dettinger, Marcus Kaufmann, MD  Cardiologist:   None   Chief Complaint  Patient presents with  . Coronary Artery Disease      History of Present Illness: Marcus [name] is a 87 y.o. male who presents for followup of coronary disease status post CABG and atrial flutter. He has been cardioverted. Subsequent follow-up in the atrial fibrillation clinic is demonstrated normal sinus rhythm.    Since I last saw him he has done remarkably well again.  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  He gets up and down the Which is the most exerting thing he does. The patient denies any new symptoms such as chest discomfort, neck or arm discomfort. There has been no new shortness of breath, PND or orthopnea. There have been no reported palpitations, presyncope or syncope.    Of note he apparently was diagnosed with hypothyroidism and is on replacement therapy since I last saw him.   Past Medical History:  Diagnosis Date  . Angina   . Arthritis   . Atrial flutter ([place])   . Chronic kidney disease    hx of BPH  . Coronary atherosclerosis of native coronary artery   . Gout   . Hypertension   . Myocardial infarction ([place])   . Other and unspecified hyperlipidemia   . Pure hypercholesterolemia     Past Surgical History:  Procedure Laterality Date  . CATARACTS    . CHEST TUBE INSERTION  11/22/2011   Procedure: CHEST TUBE INSERTION;  Surgeon: Marcus Poot, MD;  Location: [place];  Service: Thoracic;  Laterality: Right;  . CORONARY ARTERY BYPASS GRAFT  11/20/2011   Procedure: CORONARY ARTERY BYPASS GRAFTING (CABG);  Surgeon: Marcus Poot, MD;  Location: [place];  Service: Open Heart Surgery;  Laterality: N/A;  Times 3. On Pump. Using endoscopically harvested right greater saphenous vein and left internal mammary artery.   Marland Kitchen ELECTROPHYSIOLOGIC STUDY N/A 06/27/2015   Procedure: Cardioversion;   Surgeon: Marcus Sprang, MD;  Location: [place] CV LAB;  Service: Cardiovascular;  Laterality: N/A;  . LEFT AND RIGHT HEART CATHETERIZATION WITH CORONARY ANGIOGRAM N/A 11/16/2011   Procedure: LEFT AND RIGHT HEART CATHETERIZATION WITH CORONARY ANGIOGRAM;  Surgeon: Marcus Mocha, MD;  Location: [facility] C[facility];  Service: Cardiovascular;  Laterality: N/A;  . MAZE  11/20/2011   Procedure: MAZE;  Surgeon: Marcus Poot, MD;  Location: [place];  Service: Open Heart Surgery;  Laterality: N/A;     Current Outpatient Medications  Medication Sig Dispense Refill  . allopurinol (ZYLOPRIM) 300 MG tablet Take 1 tablet (300 mg total) by mouth daily. 90 tablet 3  . amLODipine (NORVASC) 10 MG tablet Take 1 tablet (10 mg total) by mouth daily. 90 tablet 3  . atorvastatin (LIPITOR) 80 MG tablet Take 1 tablet (80 mg total) by mouth daily. 90 tablet 3  . doxazosin (CARDURA) 4 MG tablet 2 mg. Take 1/2 tablet by mouth daily    . ferrous gluconate (FERGON) 324 MG tablet Take 324 mg by mouth 2 (two) times daily with a meal.    . furosemide (LASIX) 40 MG tablet Take 1 tablet (40 mg total) by mouth daily. (Patient taking differently: 40 mg. Take 1/2 tablet by mouth daily) 90 tablet 3  . levothyroxine (SYNTHROID) 50 MCG tablet Take 1 tablet (50 mcg total) by mouth daily.  90 tablet 1  . metoprolol tartrate (LOPRESSOR) 25 MG tablet TAKE 1 TABLET BY MOUTH TWICE A DAY (Patient taking differently: 25 mg. Take 1/2 tablet by mouth two times daily) 180 tablet 3  . potassium chloride SA (KLOR-CON M20) 20 MEQ tablet Take 1 tablet (20 mEq total) by mouth 2 (two) times daily. 180 tablet 3  . rivaroxaban (XARELTO) 20 MG TABS tablet TAKE 1 TABLET (20 MG TOTAL) BY MOUTH DAILY WITH SUPPER 90 tablet 3   No current facility-administered medications for this visit.    Allergies:   Patient has no known allergies.    ROS:  Please see the history of present illness.   Otherwise, review of systems are positive for none.   All other systems  are reviewed and negative.    PHYSICAL EXAM: VS:  BP 118/70   Pulse 99   Ht 5\' 11"  (1.803 m)   Wt 249 lb (112.9 kg)   BMI 34.73 kg/m  , BMI Body mass index is 34.73 kg/m. GENERAL:  Well appearing NECK:  No jugular venous distention, waveform within normal limits, carotid upstroke brisk and symmetric, no bruits, no thyromegaly LUNGS:  Clear to auscultation bilaterally CHEST:  Well healed sternotomy scar. HEART:  PMI not displaced or sustained,S1 and S2 within normal limits, no S3, no clicks, no rubs, no murmurs, irregular  ABD:  Flat, positive bowel sounds normal in frequency in pitch, no bruits, no rebound, no guarding, no midline pulsatile mass, no hepatomegaly, no splenomegaly EXT:  2 plus pulses throughout, mild right greater than left edema, no cyanosis no clubbing   EKG:  EKG is ordered today. The ekg ordered today demonstrates atrial fibrillation, rate 99, left axis deviation, low voltage in the limb and chest leads, poor anterior R wave progression, nonspecific diffuse T wave flattening.  No change from previous   Recent Labs: 09/14/2020: ALT 8; BUN 11; Creatinine, Ser 1.10; Hemoglobin 13.1; Platelets 167; Potassium 3.8; Sodium 139; TSH 3.510    Lipid Panel    Component Value Date/Time   CHOL 129 09/14/2020 1037   TRIG 90 09/14/2020 1037   HDL 44 09/14/2020 1037   CHOLHDL 2.9 09/14/2020 1037   CHOLHDL 3.8 05/17/2014 0941   VLDL 30 05/17/2014 0941   LDLCALC 68 09/14/2020 1037      Wt Readings from Last 3 Encounters:  02/01/21 249 lb (112.9 kg)  09/14/20 246 lb (111.6 kg)  03/14/20 246 lb (111.6 kg)      Other studies Reviewed: Additional studies/ records that were reviewed today include: Labs Review of the above records demonstrates:  Please see elsewhere in the note.     ASSESSMENT AND PLAN:  CAD:     The patient has no new sypmtoms.  No further cardiovascular testing is indicated.  We will continue with aggressive risk reduction and meds as  listed.  ATRIAL FLUTTER:    Mr. BENJERMAN MOLINELLI has a CHA2DS2 - VASc score of 4 .   He tolerates anticoagulation and has had good rate control.  No change in therapy.   He is up-to-date with blood work and is on the appropriate dose and has no evidence of anemia.  HTN:  The blood pressure is at target.  No change in therapy.   DYSLIPIDEMIA: LDL was 68 with an HDL of 44 earlier this year.  No change in therapy.  DOT:      He is up-to-date with this and I would see no cardiac contraindication to renewing his license.  Current medicines are reviewed at length with the patient today.  The patient does not have concerns regarding medicines.  The following changes have been made:  None  Labs/ tests ordered today include:   Orders Placed This Encounter  Procedures  . EKG 12-Lead     Disposition:   FU with me in one year.     Signed, Minus Breeding, MD  02/01/2021 4:38 PM    [facility]

## 2021-02-01 ENCOUNTER — Ambulatory Visit: Payer: PPO | Admitting: Cardiology

## 2021-02-01 ENCOUNTER — Other Ambulatory Visit: Payer: Self-pay

## 2021-02-01 ENCOUNTER — Encounter: Payer: Self-pay | Admitting: Cardiology

## 2021-02-01 VITALS — BP 118/70 | HR 99 | Ht 71.0 in | Wt 249.0 lb

## 2021-02-01 DIAGNOSIS — I4892 Unspecified atrial flutter: Secondary | ICD-10-CM | POA: Diagnosis not present

## 2021-02-01 DIAGNOSIS — E785 Hyperlipidemia, unspecified: Secondary | ICD-10-CM

## 2021-02-01 DIAGNOSIS — I2510 Atherosclerotic heart disease of native coronary artery without angina pectoris: Secondary | ICD-10-CM

## 2021-02-01 DIAGNOSIS — I10 Essential (primary) hypertension: Secondary | ICD-10-CM | POA: Diagnosis not present

## 2021-02-01 NOTE — Patient Instructions (Signed)
Medication Instructions:  The current medical regimen is effective;  continue present plan and medications.  *If you need a refill on your cardiac medications before your next appointment, please call your pharmacy*  Follow-Up: At [facility], you and your health needs are our priority.  As part of our continuing mission to provide you with exceptional heart care, we have created designated Provider Care Teams.  These Care Teams include your primary Cardiologist (physician) and Advanced Practice Providers (APPs -  Physician Assistants and Nurse Practitioners) who all work together to provide you with the care you need, when you need it.  We recommend signing up for the patient portal called "MyChart".  Sign up information is provided on this After Visit Summary.  MyChart is used to connect with patients for Virtual Visits (Telemedicine).  Patients are able to view lab/test results, encounter notes, upcoming appointments, etc.  Non-urgent messages can be sent to your provider as well.   To learn more about what you can do with MyChart, go to [email or web address].    Your next appointment:   1 year(s)  The format for your next appointment:   In Person  Provider:   James Hochrein, MD   Thank you for choosing [facility]!!    

## 2021-02-23 ENCOUNTER — Ambulatory Visit (INDEPENDENT_AMBULATORY_CARE_PROVIDER_SITE_OTHER): Payer: PPO | Admitting: Family Medicine

## 2021-02-23 ENCOUNTER — Encounter: Payer: Self-pay | Admitting: Family Medicine

## 2021-02-23 ENCOUNTER — Other Ambulatory Visit: Payer: Self-pay

## 2021-02-23 VITALS — BP 135/80 | HR 106 | Temp 97.3°F | Ht 71.0 in | Wt 250.0 lb

## 2021-02-23 DIAGNOSIS — Z0000 Encounter for general adult medical examination without abnormal findings: Secondary | ICD-10-CM

## 2021-02-23 DIAGNOSIS — H04123 Dry eye syndrome of bilateral lacrimal glands: Secondary | ICD-10-CM | POA: Diagnosis not present

## 2021-02-23 DIAGNOSIS — H40033 Anatomical narrow angle, bilateral: Secondary | ICD-10-CM | POA: Diagnosis not present

## 2021-02-23 LAB — URINALYSIS
Bilirubin, UA: NEGATIVE
Glucose, UA: NEGATIVE
Ketones, UA: NEGATIVE
Nitrite, UA: NEGATIVE
Protein,UA: NEGATIVE
RBC, UA: NEGATIVE
Specific Gravity, UA: 1.02 (ref 1.005–1.030)
Urobilinogen, Ur: 4 mg/dL — ABNORMAL HIGH (ref 0.2–1.0)
pH, UA: 7 (ref 5.0–7.5)

## 2021-02-23 LAB — HM DIABETES EYE EXAM

## 2021-02-23 NOTE — Progress Notes (Signed)
Patient presents for DOT exam.  He was sent for ophthalmologic evaluation.  Certification pending receipt of that evaluation.  Galvin Proffer MD

## 2021-03-06 ENCOUNTER — Telehealth: Payer: Self-pay | Admitting: Family Medicine

## 2021-03-07 NOTE — Telephone Encounter (Signed)
Marcus [name] aware paperwork available to pick up

## 2021-03-07 NOTE — Telephone Encounter (Signed)
Please locate his forms for me and I will sign off. Thanks, WS

## 2021-03-09 ENCOUNTER — Other Ambulatory Visit: Payer: Self-pay | Admitting: Cardiology

## 2021-03-09 ENCOUNTER — Other Ambulatory Visit: Payer: Self-pay | Admitting: Family Medicine

## 2021-03-14 ENCOUNTER — Ambulatory Visit (INDEPENDENT_AMBULATORY_CARE_PROVIDER_SITE_OTHER): Payer: PPO

## 2021-03-14 DIAGNOSIS — Z0000 Encounter for general adult medical examination without abnormal findings: Secondary | ICD-10-CM

## 2021-03-14 NOTE — Progress Notes (Signed)
MEDICARE ANNUAL WELLNESS VISIT  03/14/2021  Telephone Visit Disclaimer This Medicare AWV was conducted by telephone due to national recommendations for restrictions regarding the COVID-19 Pandemic (e.g. social distancing).  I verified, using two identifiers, that I am speaking with Marcus [name] or their authorized healthcare agent. I discussed the limitations, risks, security, and privacy concerns of performing an evaluation and management service by telephone and the potential availability of an in-person appointment in the future. The patient expressed understanding and agreed to proceed.  Location of Patient: Home Location of Provider (nurse):  [facility]  Subjective:    Marcus [name] is a 87 y.o. male patient of Dettinger, Fransisca Kaufmann, MD who had a Medicare Annual WeWorking full timellness Visit today via telephone. Marcus [name] is  and lives alone. He has two children. He reports that he is socially active and does interact with friends/family regularly. He is minimally physically active and enjoys attending car races.  Patient Care Team: Dettinger, Fransisca Kaufmann, MD as PCP - General (Family Medicine) Prescott Gum, Collier Salina, MD (Inactive) as Attending Physician (Cardiothoracic Surgery) de Stanford Scotland, MD (Inactive) (Cardiology)  Advanced Directives 03/14/2021 01/15/2019 07/22/2018 06/25/2015 06/25/2015 11/15/2011  Does Patient Have a Medical Advance Directive? No Yes No No No Patient does not have advance directive  Type of Advance Directive - [place];Living will - - - -  Copy of [place] in Chart? - No - copy requested - - - -  Would patient like information on creating a medical advance directive? No - Patient declined - Yes (ED - Information included in AVS) No - patient declined information No - patient declined information -  Pre-existing out of facility DNR order (yellow form or pink MOST form) - - - - - No    Hospital Utilization Over the Past 12 Months: #  of hospitalizations or ER visits: 0 # of surgeries: 0  Review of Systems    Patient reports that his overall health is worse compared to last year.  History obtained from chart review and the patient  Patient Reported Readings (BP, Pulse, CBG, Weight, etc) none  Pain Assessment Pain : No/denies pain     Current Medications & Allergies (verified) Allergies as of 03/14/2021   No Known Allergies      Medication List        Accurate as of March 14, 2021  4:08 PM. If you have any questions, ask your nurse or doctor.          allopurinol 300 MG tablet Commonly known as: ZYLOPRIM Take 1 tablet (300 mg total) by mouth daily.   amLODipine 10 MG tablet Commonly known as: NORVASC Take 1 tablet (10 mg total) by mouth daily.   atorvastatin 80 MG tablet Commonly known as: LIPITOR Take 1 tablet (80 mg total) by mouth daily.   doxazosin 4 MG tablet Commonly known as: CARDURA 2 mg. Take 1/2 tablet by mouth daily   ferrous gluconate 324 MG tablet Commonly known as: FERGON Take 324 mg by mouth 2 (two) times daily with a meal.   furosemide 40 MG tablet Commonly known as: LASIX TAKE 1 TABLET BY MOUTH EVERY DAY   levothyroxine 50 MCG tablet Commonly known as: SYNTHROID Take 1 tablet (50 mcg total) by mouth daily.   metoprolol tartrate 25 MG tablet Commonly known as: LOPRESSOR TAKE 1 TABLET BY MOUTH TWICE A DAY   potassium chloride SA 20 MEQ tablet Commonly known as: Klor-Con M20  Take 1 tablet (20 mEq total) by mouth 2 (two) times daily.   rivaroxaban 20 MG Tabs tablet Commonly known as: Xarelto TAKE 1 TABLET (20 MG TOTAL) BY MOUTH DAILY WITH SUPPER        History (reviewed): Past Medical History:  Diagnosis Date   Angina    Arthritis    Atrial flutter (HCC)    Chronic kidney disease    hx of BPH   Coronary atherosclerosis of native coronary artery    Gout    Hypertension    Myocardial infarction ([place])    Other and unspecified hyperlipidemia    Pure  hypercholesterolemia    Past Surgical History:  Procedure Laterality Date   CATARACTS     CHEST TUBE INSERTION  11/22/2011   Procedure: CHEST TUBE INSERTION;  Surgeon: Ivin Poot, MD;  Location: [place];  Service: Thoracic;  Laterality: Right;   CORONARY ARTERY BYPASS GRAFT  11/20/2011   Procedure: CORONARY ARTERY BYPASS GRAFTING (CABG);  Surgeon: Ivin Poot, MD;  Location: [place];  Service: Open Heart Surgery;  Laterality: N/A;  Times 3. On Pump. Using endoscopically harvested right greater saphenous vein and left internal mammary artery.    ELECTROPHYSIOLOGIC STUDY N/A 06/27/2015   Procedure: Cardioversion;  Surgeon: Deboraha Sprang, MD;  Location: [place] [facility];  Service: Cardiovascular;  Laterality: N/A;   LEFT AND RIGHT HEART CATHETERIZATION WITH CORONARY ANGIOGRAM N/A 11/16/2011   Procedure: LEFT AND RIGHT HEART CATHETERIZATION WITH CORONARY ANGIOGRAM;  Surgeon: Sherren Mocha, MD;  Location: [facility] CATH [facility];  Service: Cardiovascular;  Laterality: N/A;   MAZE  11/20/2011   Procedure: MAZE;  Surgeon: Ivin Poot, MD;  Location: [place];  Service: Open Heart Surgery;  Laterality: N/A;   Family History  Problem Relation Age of Onset   Colon cancer Son    Social History   Socioeconomic History   Marital status: Widowed    Spouse name: [name]   Number of children: 2   Years of education: Not on file   Highest education level: High school graduate  Occupational History   Occupation: works with "heavy equipment"  Tobacco Use   Smoking status: Never   Smokeless tobacco: Never  Vaping Use   Vaping Use: Never used  Substance and Sexual Activity   Alcohol use: Not Currently    Comment: hasn't had an alcoholic drink in 7 years   Drug use: No   Sexual activity: Never    Birth control/protection: None  Other Topics Concern   Not on file  Social History Narrative   Not on file   Social Determinants of Health   Financial Resource Strain: Not on file  Food Insecurity: Not on  file  Transportation Needs: Not on file  Physical Activity: Not on file  Stress: Not on file  Social Connections: Not on file    Activities of Daily Living In your present state of health, do you have any difficulty performing the following activities: 03/14/2021  Hearing? N  Vision? N  Difficulty concentrating or making decisions? N  Walking or climbing stairs? N  Dressing or bathing? N  Doing errands, shopping? N  Preparing Food and eating ? N  Using the Toilet? N  In the past six months, have you accidently leaked urine? N  Do you have problems with loss of bowel control? N  Managing your Medications? N  Managing your Finances? N  Housekeeping or managing your Housekeeping? N  Some recent data might be hidden  Patient Education/ Literacy How often do you need to have someone help you when you read instructions, pamphlets, or other written materials from your doctor or pharmacy?: 1 - Never What is the last grade level you completed in school?: 12th grade  Exercise Current Exercise Habits: The patient does not participate in regular exercise at present, Exercise limited by: None identified  Diet Patient reports consuming 3 meals a day and 0 snack(s) a day Patient reports that his primary diet is: Regular Patient reports that he does have regular access to food.   Depression Screen PHQ 2/9 Scores 02/23/2021 09/14/2020 03/14/2020 03/08/2020 12/29/2019 01/15/2019 04/09/2018  PHQ - 2 Score 0 0 0 0 0 0 0     Fall Risk Fall Risk  03/14/2021 02/23/2021 09/14/2020 03/14/2020 03/08/2020  Falls in the past year? 0 0 0 0 0  Follow up Falls evaluation completed - - - Falls evaluation completed     Objective:  Marcus [name] seemed alert and oriented and he participated appropriately during our telephone visit.  Blood Pressure Weight BMI  BP Readings from Last 3 Encounters:  02/23/21 135/80  02/01/21 118/70  09/14/20 115/67   Wt Readings from Last 3 Encounters:  02/23/21 250 lb (113.4  kg)  02/01/21 249 lb (112.9 kg)  09/14/20 246 lb (111.6 kg)   BMI Readings from Last 1 Encounters:  02/23/21 34.87 kg/m    *Unable to obtain current vital signs, weight, and BMI due to telephone visit type  Hearing/Vision  Marcus [name] did not seem to have difficulty with hearing/understanding during the telephone conversation Reports that he has had a formal eye exam by an eye care professional within the past year Reports that he has had a formal hearing evaluation within the past year *Unable to fully assess hearing and vision during telephone visit type  Cognitive Function: 6CIT Screen 03/14/2021 01/15/2019  What Year? 0 points 0 points  What month? 0 points 0 points  What time? 0 points 0 points  Count back from 20 0 points 0 points  Months in reverse 0 points 0 points  Repeat phrase 0 points 2 points  Total Score 0 2   (Normal:0-7, Significant for Dysfunction: >8)  Normal Cognitive Function Screening: Yes   Immunization & Health Maintenance Record Immunization History  Administered Date(s) Administered   Fluad Quad(high Dose 65+) 05/09/2019   Influenza, High Dose Seasonal PF 05/24/2016, 05/24/2017, 05/18/2020   Influenza,inj,Quad PF,6+ Mos 06/11/2018   Influenza-Unspecified 05/15/2014   Moderna Sars-Covid-2 Vaccination 10/28/2019, 11/25/2019, 07/19/2020   Td 08/27/2012    Health Maintenance  Topic Date Due   Zoster Vaccines- Shingrix (1 of 2) Never done   COVID-19 Vaccine (4 - Booster for Moderna series) 10/19/2020   PNA vac Low Risk Adult (1 of 2 - PCV13) 03/14/2021 (Originally 09/08/1999)   INFLUENZA VACCINE  03/27/2021   TETANUS/TDAP  08/27/2022   HPV VACCINES  Aged Out       Assessment  This is a routine wellness examination for Marcus [name].  Health Maintenance: Due or Overdue Health Maintenance Due  Topic Date Due   Zoster Vaccines- Shingrix (1 of 2) Never done   COVID-19 Vaccine (4 - Booster for Moderna series) 10/19/2020    Marcus [name] does not  need a referral for Community Assistance: Care Management:   no Social Work:    no Prescription Assistance:  no Nutrition/Diabetes Education:  no   Plan:  Personalized Goals  Goals Addressed  This Visit's Progress    Patient Stated       03/14/2021 AWV Goal: Fall Prevention  Over the next year, patient will decrease their risk for falls by: Using assistive devices, such as a cane or walker, as needed Identifying fall risks within their home and correcting them by: Removing throw rugs Adding handrails to stairs or ramps Removing clutter and keeping a clear pathway throughout the home Increasing light, especially at night Adding shower handles/bars Raising toilet seat Identifying potential personal risk factors for falls: Medication side effects Incontinence/urgency Vestibular dysfunction Hearing loss Musculoskeletal disorders Neurological disorders Orthostatic hypotension         Personalized Health Maintenance & Screening Recommendations  Pneumococcal vaccine   Lung Cancer Screening Recommended: no (Low Dose CT Chest recommended if Age 50-80 years, 30 pack-year currently smoking OR have quit w/in past 15 years) Hepatitis C Screening recommended: no HIV Screening recommended: no  Advanced Directives: Written information was not prepared per patient's request.  Referrals & Orders No orders of the defined types were placed in this encounter.   Follow-up Plan Follow-up with Dettinger, Fransisca Kaufmann, MD as planned   I have personally reviewed and noted the following in the patient's chart:   Medical and social history Use of alcohol, tobacco or illicit drugs  Current medications and supplements Functional ability and status Nutritional status Physical activity Advanced directives List of other physicians Hospitalizations, surgeries, and ER visits in previous 12 months Vitals Screenings to include cognitive, depression, and falls Referrals and  appointments  In addition, I have reviewed and discussed with Marcus [name] certain preventive protocols, quality metrics, and best practice recommendations. A written personalized care plan for preventive services as well as general preventive health recommendations is available and can be mailed to the patient at his request.      Felicity Coyer, LPN    09/12/3565

## 2021-03-15 ENCOUNTER — Other Ambulatory Visit: Payer: Self-pay

## 2021-03-15 ENCOUNTER — Encounter: Payer: Self-pay | Admitting: Family Medicine

## 2021-03-15 ENCOUNTER — Ambulatory Visit (INDEPENDENT_AMBULATORY_CARE_PROVIDER_SITE_OTHER): Payer: PPO | Admitting: Family Medicine

## 2021-03-15 VITALS — BP 138/81 | HR 92 | Ht 71.0 in | Wt 250.0 lb

## 2021-03-15 DIAGNOSIS — I10 Essential (primary) hypertension: Secondary | ICD-10-CM | POA: Diagnosis not present

## 2021-03-15 DIAGNOSIS — E785 Hyperlipidemia, unspecified: Secondary | ICD-10-CM

## 2021-03-15 DIAGNOSIS — E7800 Pure hypercholesterolemia, unspecified: Secondary | ICD-10-CM

## 2021-03-15 DIAGNOSIS — E039 Hypothyroidism, unspecified: Secondary | ICD-10-CM

## 2021-03-15 DIAGNOSIS — I480 Paroxysmal atrial fibrillation: Secondary | ICD-10-CM | POA: Diagnosis not present

## 2021-03-15 MED ORDER — ALLOPURINOL 300 MG PO TABS: 300.0000 mg | ORAL_TABLET | Freq: Every day | ORAL | 3 refills | Status: DC

## 2021-03-15 MED ORDER — FUROSEMIDE 40 MG PO TABS: 40.0000 mg | ORAL_TABLET | Freq: Every day | ORAL | 3 refills | Status: DC

## 2021-03-15 MED ORDER — RIVAROXABAN 20 MG PO TABS: ORAL_TABLET | ORAL | 3 refills | Status: DC

## 2021-03-15 MED ORDER — AMLODIPINE BESYLATE 10 MG PO TABS: 10.0000 mg | ORAL_TABLET | Freq: Every day | ORAL | 3 refills | Status: DC

## 2021-03-15 MED ORDER — LEVOTHYROXINE SODIUM 50 MCG PO TABS: 50.0000 ug | ORAL_TABLET | Freq: Every day | ORAL | 3 refills | Status: DC

## 2021-03-15 MED ORDER — ATORVASTATIN CALCIUM 80 MG PO TABS: 80.0000 mg | ORAL_TABLET | Freq: Every day | ORAL | 3 refills | Status: DC

## 2021-03-15 NOTE — Progress Notes (Signed)
BP 138/81   Pulse 92   Ht _0  (1.803 m)   Wt 250 lb (113.4 kg)   SpO2 95%   BMI 34.87 kg/m    Subjective:   Patient ID: Marcus [name], male    DOB: Mar 22, 1933, 87 y.o.   MRN: [id number]  HPI: Marcus [name] is a 87 y.o. male presenting on 03/15/2021 for Medical Management of Chronic Issues and Hypertension   HPI Hypertension Patient is currently on amlodipine and metoprolol and furosemide and doxazosin, and their blood pressure today is 138/81. Patient denies any lightheadedness or dizziness. Patient denies headaches, blurred vision, chest pains, shortness of breath, or weakness. Denies any side effects from medication and is content with current medication.   Hyperlipidemia Patient is coming in for recheck of his hyperlipidemia. The patient is currently taking Lipitor. They deny any issues with myalgias or history of liver damage from it. They deny any focal numbness or weakness or chest pain.   A. fib recheck Patient is currently on Xarelto for A. fib.  Patient denies any bruising or bleeding episodes.  Relevant past medical, surgical, family and social history reviewed and updated as indicated. Interim medical history since our last visit reviewed. Allergies and medications reviewed and updated.  Review of Systems  Constitutional:  Negative for chills and fever.  Eyes:  Negative for visual disturbance.  Respiratory:  Negative for shortness of breath and wheezing.   Cardiovascular:  Negative for chest pain and leg swelling.  Musculoskeletal:  Negative for back pain and gait problem.  Skin:  Negative for rash.  Neurological:  Negative for dizziness, weakness and light-headedness.  All other systems reviewed and are negative.  Per HPI unless specifically indicated above   Allergies as of 03/15/2021   No Known Allergies      Medication List        Accurate as of March 15, 2021  9:33 AM. If you have any questions, ask your nurse or doctor.          allopurinol  300 MG tablet Commonly known as: ZYLOPRIM Take 1 tablet (300 mg total) by mouth daily.   amLODipine 10 MG tablet Commonly known as: NORVASC Take 1 tablet (10 mg total) by mouth daily.   atorvastatin 80 MG tablet Commonly known as: LIPITOR Take 1 tablet (80 mg total) by mouth daily.   doxazosin 4 MG tablet Commonly known as: CARDURA 2 mg. Take 1/2 tablet by mouth daily   ferrous gluconate 324 MG tablet Commonly known as: FERGON Take 324 mg by mouth 2 (two) times daily with a meal.   furosemide 40 MG tablet Commonly known as: LASIX Take 1 tablet (40 mg total) by mouth daily.   levothyroxine 50 MCG tablet Commonly known as: SYNTHROID Take 1 tablet (50 mcg total) by mouth daily.   metoprolol tartrate 25 MG tablet Commonly known as: LOPRESSOR TAKE 1 TABLET BY MOUTH TWICE A DAY   potassium chloride SA 20 MEQ tablet Commonly known as: Klor-Con M20 Take 1 tablet (20 mEq total) by mouth 2 (two) times daily.   rivaroxaban 20 MG Tabs tablet Commonly known as: Xarelto TAKE 1 TABLET (20 MG TOTAL) BY MOUTH DAILY WITH SUPPER         Objective:   BP 138/81   Pulse 92   Ht _1  (1.803 m)   Wt 250 lb (113.4 kg)   SpO2 95%   BMI 34.87 kg/m   Wt Readings from Last 3 Encounters:  03/15/21  250 lb (113.4 kg)  02/23/21 250 lb (113.4 kg)  02/01/21 249 lb (112.9 kg)    Physical Exam Vitals and nursing note reviewed.  Constitutional:      General: He is not in acute distress.    Appearance: He is well-developed. He is not diaphoretic.  Eyes:     General: No scleral icterus.    Conjunctiva/sclera: Conjunctivae normal.  Neck:     Thyroid: No thyromegaly.  Cardiovascular:     Rate and Rhythm: Normal rate. Rhythm irregular.     Heart sounds: Normal heart sounds. No murmur heard. Pulmonary:     Effort: Pulmonary effort is normal. No respiratory distress.     Breath sounds: Normal breath sounds. No wheezing.  Musculoskeletal:        General: Normal range of motion.      Cervical back: Neck supple.  Lymphadenopathy:     Cervical: No cervical adenopathy.  Skin:    General: Skin is warm and dry.     Findings: No rash.  Neurological:     Mental Status: He is alert and oriented to person, place, and time.     Coordination: Coordination normal.  Psychiatric:        Behavior: Behavior normal.      Assessment & Plan:   Problem List Items Addressed This Visit       Cardiovascular and Mediastinum   HTN (hypertension)   Relevant Medications   atorvastatin (LIPITOR) 80 MG tablet   amLODipine (NORVASC) 10 MG tablet   furosemide (LASIX) 40 MG tablet   rivaroxaban (XARELTO) 20 MG TABS tablet   Paroxysmal atrial fibrillation (HCC)   Relevant Medications   atorvastatin (LIPITOR) 80 MG tablet   amLODipine (NORVASC) 10 MG tablet   furosemide (LASIX) 40 MG tablet   rivaroxaban (XARELTO) 20 MG TABS tablet     Other   Pure hypercholesterolemia   Relevant Medications   atorvastatin (LIPITOR) 80 MG tablet   amLODipine (NORVASC) 10 MG tablet   furosemide (LASIX) 40 MG tablet   rivaroxaban (XARELTO) 20 MG TABS tablet   Other Relevant Orders   CBC with Differential/Platelet   CMP14+EGFR   Lipid panel   TSH   Dyslipidemia   Relevant Medications   atorvastatin (LIPITOR) 80 MG tablet   Other Visit Diagnoses     Essential hypertension    -  Primary   Relevant Medications   atorvastatin (LIPITOR) 80 MG tablet   amLODipine (NORVASC) 10 MG tablet   furosemide (LASIX) 40 MG tablet   rivaroxaban (XARELTO) 20 MG TABS tablet   Other Relevant Orders   CBC with Differential/Platelet   CMP14+EGFR   Lipid panel   TSH   Hypothyroidism, unspecified type       Relevant Medications   levothyroxine (SYNTHROID) 50 MCG tablet   Other Relevant Orders   CBC with Differential/Platelet   CMP14+EGFR   Lipid panel   TSH      Seems to be doing well, no changes in medicine, will continue to monitor and will do blood work today.   Follow up plan: Return in about 6  months (around 09/15/2021), or if symptoms worsen or fail to improve, for Hypothyroidism and cholesterol.  Counseling provided for all of the vaccine components Orders Placed This Encounter  Procedures   CBC with Differential/Platelet   CMP14+EGFR   Lipid panel   TSH    Caryl Pina, MD [facility] 03/15/2021, 9:33 AM

## 2021-03-16 LAB — CBC WITH DIFFERENTIAL/PLATELET
Basophils Absolute: 0.1 10*3/uL (ref 0.0–0.2)
Basos: 1 %
EOS (ABSOLUTE): 0.4 10*3/uL (ref 0.0–0.4)
Eos: 6 %
Hematocrit: 38.5 % (ref 37.5–51.0)
Hemoglobin: 12.9 g/dL — ABNORMAL LOW (ref 13.0–17.7)
Immature Grans (Abs): 0 10*3/uL (ref 0.0–0.1)
Immature Granulocytes: 0 %
Lymphocytes Absolute: 1.7 10*3/uL (ref 0.7–3.1)
Lymphs: 28 %
MCH: 30.2 pg (ref 26.6–33.0)
MCHC: 33.5 g/dL (ref 31.5–35.7)
MCV: 90 fL (ref 79–97)
Monocytes Absolute: 0.7 10*3/uL (ref 0.1–0.9)
Monocytes: 11 %
Neutrophils Absolute: 3.2 10*3/uL (ref 1.4–7.0)
Neutrophils: 54 %
Platelets: 170 10*3/uL (ref 150–450)
RBC: 4.27 x10E6/uL (ref 4.14–5.80)
RDW: 13.1 % (ref 11.6–15.4)
WBC: 5.9 10*3/uL (ref 3.4–10.8)

## 2021-03-16 LAB — CMP14+EGFR
ALT: 10 IU/L (ref 0–44)
AST: 13 IU/L (ref 0–40)
Albumin/Globulin Ratio: 1.6 (ref 1.2–2.2)
Albumin: 3.8 g/dL (ref 3.6–4.6)
Alkaline Phosphatase: 117 IU/L (ref 44–121)
BUN/Creatinine Ratio: 11 (ref 10–24)
BUN: 10 mg/dL (ref 8–27)
Bilirubin Total: 0.9 mg/dL (ref 0.0–1.2)
CO2: 27 mmol/L (ref 20–29)
Calcium: 9 mg/dL (ref 8.6–10.2)
Chloride: 102 mmol/L (ref 96–106)
Creatinine, Ser: 0.92 mg/dL (ref 0.76–1.27)
Globulin, Total: 2.4 g/dL (ref 1.5–4.5)
Glucose: 95 mg/dL (ref 65–99)
Potassium: 4.2 mmol/L (ref 3.5–5.2)
Sodium: 143 mmol/L (ref 134–144)
Total Protein: 6.2 g/dL (ref 6.0–8.5)
eGFR: 81 mL/min/{1.73_m2} (ref >59)

## 2021-03-16 LAB — TSH: TSH: 2.48 u[IU]/mL (ref 0.450–4.500)

## 2021-03-16 LAB — LIPID PANEL
Chol/HDL Ratio: 3.1 ratio (ref 0.0–5.0)
Cholesterol, Total: 122 mg/dL (ref 100–199)
HDL: 39 mg/dL — ABNORMAL LOW (ref >39)
LDL Chol Calc (NIH): 65 mg/dL (ref 0–99)
Triglycerides: 97 mg/dL (ref 0–149)
VLDL Cholesterol Cal: 18 mg/dL (ref 5–40)

## 2021-04-06 ENCOUNTER — Other Ambulatory Visit: Payer: Self-pay | Admitting: Family Medicine

## 2021-06-28 ENCOUNTER — Other Ambulatory Visit: Payer: Self-pay

## 2021-06-28 ENCOUNTER — Encounter: Payer: Self-pay | Admitting: Family Medicine

## 2021-06-28 ENCOUNTER — Ambulatory Visit (INDEPENDENT_AMBULATORY_CARE_PROVIDER_SITE_OTHER): Payer: PPO | Admitting: Family Medicine

## 2021-06-28 VITALS — BP 142/80 | HR 112 | Temp 97.6°F | Ht 71.0 in | Wt 247.1 lb

## 2021-06-28 DIAGNOSIS — M7582 Other shoulder lesions, left shoulder: Secondary | ICD-10-CM | POA: Diagnosis not present

## 2021-06-28 DIAGNOSIS — M7581 Other shoulder lesions, right shoulder: Secondary | ICD-10-CM

## 2021-06-28 MED ORDER — PREDNISONE 20 MG PO TABS: ORAL_TABLET | ORAL | 0 refills | Status: DC

## 2021-06-28 NOTE — Progress Notes (Signed)
Subjective: CC: Shoulder pain PCP: Dettinger, Fransisca Kaufmann, MD EGB:TDVVO L Pilson is a 87 y.o. male presenting to clinic today for:  1.  Shoulder pain Patient reports that he has had intermittent bilateral shoulder pain on and off for about 2 years now.  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  He denies any radiation down the arms, associated weakness or sensory changes.  Has been utilizing Tylenol and has taken a total of 4 which is the most he is ever taken that in the last 2 years as he typically does not trying to take anything.  He is unable to take oral NSAIDs secondary to chronic anticoagulation with Xarelto.  Does not feel that pain is especially bothersome today.   ROS: Per HPI  No Known Allergies Past Medical History:  Diagnosis Date   Angina    Arthritis    Atrial flutter ([facility])    Chronic kidney disease    hx of BPH   Coronary atherosclerosis of native coronary artery    Gout    Hypertension    Myocardial infarction ([place])    Other and unspecified hyperlipidemia    Pure hypercholesterolemia     Current Outpatient Medications:    allopurinol (ZYLOPRIM) 300 MG tablet, Take 1 tablet (300 mg total) by mouth daily., Disp: 90 tablet, Rfl: 3   amLODipine (NORVASC) 10 MG tablet, Take 1 tablet (10 mg total) by mouth daily., Disp: 90 tablet, Rfl: 3   atorvastatin (LIPITOR) 80 MG tablet, Take 1 tablet (80 mg total) by mouth daily., Disp: 90 tablet, Rfl: 3   doxazosin (CARDURA) 4 MG tablet, 2 mg. Take 1/2 tablet by mouth daily, Disp: , Rfl:    ferrous gluconate (FERGON) 324 MG tablet, Take 324 mg by mouth 2 (two) times daily with a meal., Disp: , Rfl:    furosemide (LASIX) 40 MG tablet, Take 1 tablet (40 mg total) by mouth daily., Disp: 90 tablet, Rfl: 3   KLOR-CON M20 20 MEQ tablet, TAKE 1 TABLET BY MOUTH TWICE A DAY, Disp: 180 tablet, Rfl: 1   levothyroxine (SYNTHROID) 50 MCG tablet, Take 1  tablet (50 mcg total) by mouth daily., Disp: 90 tablet, Rfl: 3   metoprolol tartrate (LOPRESSOR) 25 MG tablet, TAKE 1 TABLET BY MOUTH TWICE A DAY, Disp: 180 tablet, Rfl: 3   rivaroxaban (XARELTO) 20 MG TABS tablet, TAKE 1 TABLET (20 MG TOTAL) BY MOUTH DAILY WITH SUPPER, Disp: 90 tablet, Rfl: 3 Social History   Socioeconomic History   Marital status: Widowed    Spouse name: [name]   Number of children: 2   Years of education: Not on file   Highest education level: High school graduate  Occupational History   Occupation: works with "heavy equipment"  Tobacco Use   Smoking status: Never   Smokeless tobacco: Never  Vaping Use   Vaping Use: Never used  Substance and Sexual Activity   Alcohol use: Not Currently    Comment: hasn't had an alcoholic drink in 7 years   Drug use: No   Sexual activity: Never    Birth control/protection: None  Other Topics Concern   Not on file  Social History Narrative   Not on file   Social Determinants of Health   Financial Resource Strain: Not on file  Food Insecurity: Not on file  Transportation Needs: Not on file  Physical Activity: Not on file  Stress: Not  on file  Social Connections: Not on file  Intimate Partner Violence: Not on file   Family History  Problem Relation Age of Onset   Colon cancer Son     Objective: Office vital signs reviewed. BP (!) 142/80   Pulse (!) 112   Temp 97.6 F (36.4 C)   Ht 5\' 11"  (1.803 m)   Wt 247 lb 1.6 oz (112.1 kg)   SpO2 94%   BMI 34.46 kg/m   Physical Examination:  General: Awake, alert, well nourished, No acute distress MSK:   Left shoulder: Has about a 45 degree loss of range of motion AB duction, flexion and very limited internal rotation.  No tenderness to palpation to the shoulder.  No palpable bony abnormalities.  Negative empty can testing  Right shoulder:  Has about a 45 degree loss of range of motion AB duction, flexion and very limited internal rotation.  No tenderness to palpation to  the shoulder.  No palpable bony abnormalities.  Negative empty can testing  Assessment/ Plan: 87 y.o. male   Rotator cuff tendinitis, left - Plan: predniSONE (DELTASONE) 20 MG tablet  Rotator cuff tendinitis, right - Plan: predniSONE (DELTASONE) 20 MG tablet  No evidence of rotator cuff tear.  I do have some concern that he may be developing adhesive capsulitis based on limited range of motion bilaterally.  I given him home physical therapy range of motion exercises to perform at home.  Have also placed him on a short course of prednisone as he is on oral anticoagulant and cannot take oral NSAIDs.  Follow-up as needed  No orders of the defined types were placed in this encounter.  No orders of the defined types were placed in this encounter.    Janora Norlander, DO [facility] [phone]

## 2021-06-28 NOTE — Patient Instructions (Signed)
Rotator Cuff Tendinitis Rotator cuff tendinitis is inflammation of the tendons in the rotator cuff. Tendons are tough, cord-like bands that connect muscle to bone. The rotator cuff includes all of the muscles and tendons that connect the arm to the shoulder. The rotator cuff holds the head of the humerus, or the upper arm bone, in the cup of the shoulder blade (scapula). This condition can lead to a long-term or chronic tear. The tear may be partial or complete. What are the causes? This condition is usually caused by overusing the rotator cuff. What increases the risk? This condition is more likely to develop in athletes and workers who frequently use their shoulder or reach over their heads. This can include activities such as: Tennis. Baseball or softball. Swimming. Construction work. Painting. What are the signs or symptoms? Symptoms of this condition include: Pain that spreads (radiates) from the shoulder to the upper arm. Swelling and tenderness in front of the shoulder. Pain when reaching, pulling, or lifting the arm above the head. Pain when lowering the arm from above the head. Minor pain in the shoulder when resting. Increased pain in the shoulder at night. Difficulty placing the arm behind the back. How is this diagnosed? This condition is diagnosed with a physical exam and medical history. Tests may also be done, including: X-rays. MRI. Ultrasound. CT with or without contrast. How is this treated? Treatment for this condition depends on the severity of the condition. In less severe cases, treatment may include: Rest. This may be done with a sling that holds the shoulder still (immobilization). Your health care provider may also recommend avoiding activities that involve lifting your arm over your head. Icing the shoulder. Anti-inflammatory medicines, such as aspirin or ibuprofen. In more severe cases, treatment may include: Physical therapy. Steroid  injections. Surgery. Follow these instructions at home: If you have a sling: Wear the sling as told by your health care provider. Remove it only as told by your health care provider. Loosen it if your fingers tingle, become numb, or turn cold and blue. Keep it clean. If the sling is not waterproof: Do not let it get wet. Cover it with a watertight covering when you take a bath or shower. Managing pain, stiffness, and swelling  If directed, put ice on the injured area. To do this: If you have a removable sling, remove it as told by your health care provider. Put ice in a plastic bag. Place a towel between your skin and the bag. Leave the ice on for 20 minutes, 2-3 times a day. Move your fingers often to reduce stiffness and swelling. Raise (elevate) the injured area above the level of your heart while you are lying down. Find a comfortable sleeping position, or sleep in a recliner, if available. Activity Rest your shoulder as told by your health care provider. Ask your health care provider when it is safe to drive if you have a sling on your arm. Return to your normal activities as told by your health care provider. Ask your health care provider what activities are safe for you. Do any exercises or stretches as told by your health care provider or physical therapist. If you do repetitive overhead tasks, take Guidotti breaks in between and include stretching exercises as told by your health care provider. General instructions Do not use any products that contain nicotine or tobacco, such as cigarettes, e-cigarettes, and chewing tobacco. These can delay healing. If you need help quitting, ask your health care provider. Take  over-the-counter and prescription medicines only as told by your health care provider. Keep all follow-up visits as told by your health care provider. This is important. Contact a health care provider if: Your pain gets worse. You have new pain in your arm, hands, or  fingers. Your pain is not relieved with medicine or does not get better after 6 weeks of treatment. You have crackling sensations when moving your shoulder in certain directions. You hear a snapping sound after using your shoulder, followed by severe pain and weakness. Get help right away if: Your arm, hand, or fingers are numb or tingling. Your arm, hand, or fingers are swollen or painful or they turn white or blue. Summary Rotator cuff tendinitis is inflammation of the tendons in the rotator cuff. Tendons are tough, cord-like bands that connect muscle to bone. This condition is usually caused by overusing the rotator cuff, which includes all of the muscles and tendons that connect the arm to the shoulder. This condition is more likely to develop in athletes and workers who frequently use their shoulder or reach over their heads. Treatment generally includes rest, anti-inflammatory medicines, and icing. In some cases, physical therapy and steroid injections may be needed. In severe cases, surgery may be needed. This information is not intended to replace advice given to you by your health care provider. Make sure you discuss any questions you have with your health care provider. Document Revised: 05/18/2019 Document Reviewed: 05/18/2019 Elsevier Patient Education  [place].

## 2021-07-05 ENCOUNTER — Other Ambulatory Visit: Payer: Self-pay

## 2021-07-05 ENCOUNTER — Ambulatory Visit (INDEPENDENT_AMBULATORY_CARE_PROVIDER_SITE_OTHER): Payer: PPO | Admitting: Family Medicine

## 2021-07-05 ENCOUNTER — Encounter: Payer: Self-pay | Admitting: Family Medicine

## 2021-07-05 VITALS — BP 135/79 | HR 95 | Ht 71.0 in | Wt 244.0 lb

## 2021-07-05 DIAGNOSIS — M7582 Other shoulder lesions, left shoulder: Secondary | ICD-10-CM | POA: Diagnosis not present

## 2021-07-05 MED ORDER — PREDNISONE 20 MG PO TABS: ORAL_TABLET | ORAL | 0 refills | Status: DC

## 2021-07-05 NOTE — Progress Notes (Signed)
BP 135/79   Pulse 95   Ht 5\' 11"  (1.803 m)   Wt 244 lb (110.7 kg)   SpO2 97%   BMI 34.03 kg/m    Subjective:   Patient ID: Marcus [name], male    DOB: Nov 06, 1932, 87 y.o.   MRN: [id number]  HPI: Marcus [name] is a 87 y.o. male presenting on 07/05/2021 for Shoulder Pain (left)   HPI Left shoulder pain Patient was coming in because his left shoulder pain had come back.  He said initially the prednisone worked to get rid of it and then it came back more severe, almost as severe as it was initially and that is when he made the appointment but he says just today in the last 24 hours it has significantly reduced to where it is little sore but almost gone.  He says he initially injured it about 3 weeks ago he thinks from all of his driving and both shoulders became irritated but his left was 1 it was causing severe pain.  He feels like the prednisone helped a lot and would like to do another short course of it.  Relevant past medical, surgical, family and social history reviewed and updated as indicated. Interim medical history since our last visit reviewed. Allergies and medications reviewed and updated.  Review of Systems  Constitutional:  Negative for chills and fever.  Eyes:  Negative for visual disturbance.  Respiratory:  Negative for shortness of breath and wheezing.   Cardiovascular:  Negative for chest pain and leg swelling.  Musculoskeletal:  Positive for arthralgias and myalgias. Negative for back pain and gait problem.  Skin:  Negative for rash.  Neurological:  Negative for weakness and numbness.  All other systems reviewed and are negative.  Per HPI unless specifically indicated above   Allergies as of 07/05/2021   No Known Allergies      Medication List        Accurate as of July 05, 2021  2:46 PM. If you have any questions, ask your nurse or doctor.          allopurinol 300 MG tablet Commonly known as: ZYLOPRIM Take 1 tablet (300 mg total) by mouth  daily.   amLODipine 10 MG tablet Commonly known as: NORVASC Take 1 tablet (10 mg total) by mouth daily.   atorvastatin 80 MG tablet Commonly known as: LIPITOR Take 1 tablet (80 mg total) by mouth daily.   doxazosin 4 MG tablet Commonly known as: CARDURA 2 mg. Take 1/2 tablet by mouth daily   ferrous gluconate 324 MG tablet Commonly known as: FERGON Take 324 mg by mouth 2 (two) times daily with a meal.   furosemide 40 MG tablet Commonly known as: LASIX Take 1 tablet (40 mg total) by mouth daily.   Klor-Con M20 20 MEQ tablet Generic drug: potassium chloride SA TAKE 1 TABLET BY MOUTH TWICE A DAY   levothyroxine 50 MCG tablet Commonly known as: SYNTHROID Take 1 tablet (50 mcg total) by mouth daily.   metoprolol tartrate 25 MG tablet Commonly known as: LOPRESSOR TAKE 1 TABLET BY MOUTH TWICE A DAY   predniSONE 20 MG tablet Commonly known as: DELTASONE 2 po at same time daily for 5 days   rivaroxaban 20 MG Tabs tablet Commonly known as: Xarelto TAKE 1 TABLET (20 MG TOTAL) BY MOUTH DAILY WITH SUPPER         Objective:   BP 135/79   Pulse 95   Ht 5\' 11"  (1.803  m)   Wt 244 lb (110.7 kg)   SpO2 97%   BMI 34.03 kg/m   Wt Readings from Last 3 Encounters:  07/05/21 244 lb (110.7 kg)  06/28/21 247 lb 1.6 oz (112.1 kg)  03/15/21 250 lb (113.4 kg)    Physical Exam Vitals and nursing note reviewed.  Constitutional:      Appearance: Normal appearance.  Musculoskeletal:     Left shoulder: Crepitus present. No swelling, deformity, tenderness (No tenderness to palpation) or bony tenderness. Decreased range of motion (Slightly decreased range of motion in abduction, cannot reach all the way over his head, not new). Normal strength. Normal pulse.  Neurological:     Mental Status: He is alert.      Assessment & Plan:   Problem List Items Addressed This Visit   None Visit Diagnoses     Rotator cuff tendinitis, left    -  Primary     We will do another short  course of prednisone to see if we can eradicate it completely.  She discussed the possibility of injection versus imaging in the future but do not feel it is warranted because he is much better today.  Follow up plan: Return if symptoms worsen or fail to improve.  Counseling provided for all of the vaccine components No orders of the defined types were placed in this encounter.   Caryl Pina, MD [facility] 07/05/2021, 2:46 PM

## 2021-09-15 ENCOUNTER — Ambulatory Visit (INDEPENDENT_AMBULATORY_CARE_PROVIDER_SITE_OTHER): Payer: PPO | Admitting: Family Medicine

## 2021-09-15 ENCOUNTER — Telehealth: Payer: Self-pay | Admitting: Family Medicine

## 2021-09-15 ENCOUNTER — Encounter: Payer: Self-pay | Admitting: Family Medicine

## 2021-09-15 VITALS — BP 125/77 | HR 100 | Ht 71.0 in | Wt 254.0 lb

## 2021-09-15 DIAGNOSIS — R609 Edema, unspecified: Secondary | ICD-10-CM

## 2021-09-15 DIAGNOSIS — R0609 Other forms of dyspnea: Secondary | ICD-10-CM

## 2021-09-15 DIAGNOSIS — I10 Essential (primary) hypertension: Secondary | ICD-10-CM | POA: Diagnosis not present

## 2021-09-15 DIAGNOSIS — E7800 Pure hypercholesterolemia, unspecified: Secondary | ICD-10-CM

## 2021-09-15 DIAGNOSIS — E785 Hyperlipidemia, unspecified: Secondary | ICD-10-CM

## 2021-09-15 DIAGNOSIS — I480 Paroxysmal atrial fibrillation: Secondary | ICD-10-CM

## 2021-09-15 MED ORDER — FUROSEMIDE 20 MG PO TABS: 20.0000 mg | ORAL_TABLET | Freq: Every day | ORAL | 3 refills | Status: DC

## 2021-09-15 NOTE — Telephone Encounter (Signed)
Spoke with daughter pt is to double lasix for 4 days only.

## 2021-09-15 NOTE — Patient Instructions (Signed)
Take an extra furosemide every day for the next 4 days and then let us know if swelling and breathing is not improved.

## 2021-09-15 NOTE — Progress Notes (Signed)
BP 125/77    Pulse 100    Ht '5\' 11"'  (1.803 m)    Wt 254 lb (115.2 kg)    SpO2 92%    BMI 35.43 kg/m    Subjective:   Patient ID: Marcus [name], male    DOB: [birth year], 88 y.o.   MRN: [id number]  HPI: Marcus [name] is a 88 y.o. male presenting on 09/15/2021 for Medical Management of Chronic Issues and Hypertension   HPI Complains of exertional dyspnea and increased swelling over the past month.  He says is very mild and denies dyspnea when he lays flat.  He says it is more when he is up and walking around.  He does feel like he has increased fluid weight although he says his weights at home when he weighs himself once a month have been steady and not increased.  He does take furosemide and has a history of CAD but his last echocardiogram in 2016 was normal.  He does see cardiology still.  Hypertension Patient is currently on amlodipine and doxazosin and metoprolol, and their blood pressure today is 125/77 . Patient denies any lightheadedness or dizziness. Patient denies headaches, blurred vision, chest pains, shortness of breath, or weakness. Denies any side effects from medication and is content with current medication.   Hypothyroidism recheck Patient is coming in for thyroid recheck today as well. They deny any issues with hair changes or heat or cold problems or diarrhea or constipation. They deny any chest pain or palpitations. They are currently on levothyroxine 50 micrograms   Hyperlipidemia Patient is coming in for recheck of his hyperlipidemia. The patient is currently taking Lipitor. They deny any issues with myalgias or history of liver damage from it. They deny any focal numbness or weakness or chest pain.   Relevant past medical, surgical, family and social history reviewed and updated as indicated. Interim medical history since our last visit reviewed. Allergies and medications reviewed and updated.  Review of Systems  Constitutional:  Negative for chills and fever.  Eyes:   Negative for visual disturbance.  Respiratory:  Negative for shortness of breath and wheezing.   Cardiovascular:  Negative for chest pain and leg swelling.  Musculoskeletal:  Negative for back pain and gait problem.  Skin:  Negative for rash.  Neurological:  Negative for dizziness and weakness.  All other systems reviewed and are negative.  Per HPI unless specifically indicated above   Allergies as of 09/15/2021   No Known Allergies      Medication List        Accurate as of September 15, 2021  9:59 AM. If you have any questions, ask your nurse or doctor.          STOP taking these medications    predniSONE 20 MG tablet Commonly known as: DELTASONE Stopped by: Fransisca Kaufmann Shaquoya Cosper, MD       TAKE these medications    allopurinol 300 MG tablet Commonly known as: ZYLOPRIM Take 1 tablet (300 mg total) by mouth daily.   amLODipine 10 MG tablet Commonly known as: NORVASC Take 1 tablet (10 mg total) by mouth daily.   atorvastatin 80 MG tablet Commonly known as: LIPITOR Take 1 tablet (80 mg total) by mouth daily.   doxazosin 4 MG tablet Commonly known as: CARDURA 2 mg. Take 1/2 tablet by mouth daily   ferrous gluconate 324 MG tablet Commonly known as: FERGON Take 324 mg by mouth 2 (two) times daily with a meal.  furosemide 40 MG tablet Commonly known as: LASIX Take 1 tablet (40 mg total) by mouth daily.   Klor-Con M20 20 MEQ tablet Generic drug: potassium chloride SA TAKE 1 TABLET BY MOUTH TWICE A DAY   levothyroxine 50 MCG tablet Commonly known as: SYNTHROID Take 1 tablet (50 mcg total) by mouth daily.   metoprolol tartrate 25 MG tablet Commonly known as: LOPRESSOR TAKE 1 TABLET BY MOUTH TWICE A DAY   rivaroxaban 20 MG Tabs tablet Commonly known as: Xarelto TAKE 1 TABLET (20 MG TOTAL) BY MOUTH DAILY WITH SUPPER         Objective:   BP 125/77    Pulse 100    Ht '5\' 11"'  (1.803 m)    Wt 254 lb (115.2 kg)    SpO2 92%    BMI 35.43 kg/m   Wt  Readings from Last 3 Encounters:  09/15/21 254 lb (115.2 kg)  07/05/21 244 lb (110.7 kg)  06/28/21 247 lb 1.6 oz (112.1 kg)    Physical Exam Vitals and nursing note reviewed.  Constitutional:      General: He is not in acute distress.    Appearance: He is well-developed. He is not diaphoretic.  Eyes:     General: No scleral icterus.    Conjunctiva/sclera: Conjunctivae normal.  Neck:     Thyroid: No thyromegaly.  Cardiovascular:     Rate and Rhythm: Normal rate and regular rhythm.     Heart sounds: Normal heart sounds. No murmur heard. Pulmonary:     Effort: Pulmonary effort is normal. No respiratory distress.     Breath sounds: Normal breath sounds. No wheezing.  Musculoskeletal:        General: Normal range of motion.     Cervical back: Neck supple.  Lymphadenopathy:     Cervical: No cervical adenopathy.  Skin:    General: Skin is warm and dry.     Findings: No rash.  Neurological:     Mental Status: He is alert and oriented to person, place, and time.     Coordination: Coordination normal.  Psychiatric:        Behavior: Behavior normal.    Results for orders placed or performed in visit on 03/20/21  HM DIABETES EYE EXAM  Result Value Ref Range   HM Diabetic Eye Exam No Retinopathy No Retinopathy    Assessment & Plan:   Problem List Items Addressed This Visit       Cardiovascular and Mediastinum   HTN (hypertension)   Relevant Orders   CMP14+EGFR   Paroxysmal atrial fibrillation ([place])   Relevant Orders   CBC with Differential/Platelet   TSH     Other   Pure hypercholesterolemia - Primary   Relevant Orders   Lipid panel   Dyslipidemia   Relevant Orders   Lipid panel   Other Visit Diagnoses     Exertional dyspnea       Peripheral edema         Patient feels like he has had increased exertional dyspnea and peripheral edema over the past month compared to his normal.  He does feel like he has more fluid.  Recommended he take an increased dose of  furosemide every day for the next 4 days and then if not improved we will evaluate from there.  Will assess with a BNP Echocardiogram in 2016, if not improved with the furosemide may consider needing a repeat echocardiogram Follow up plan: Return in about 6 months (around 03/15/2022), or if symptoms  worsen or fail to improve, for Hypertension and A. fib and class.  Counseling provided for all of the vaccine components Orders Placed This Encounter  Procedures   CBC with Differential/Platelet   CMP14+EGFR   Lipid panel   TSH    Caryl Pina, MD [facility] 09/15/2021, 9:59 AM

## 2021-09-16 LAB — CMP14+EGFR
ALT: 9 IU/L (ref 0–44)
AST: 13 IU/L (ref 0–40)
Albumin/Globulin Ratio: 1.6 (ref 1.2–2.2)
Albumin: 4.2 g/dL (ref 3.6–4.6)
Alkaline Phosphatase: 112 IU/L (ref 44–121)
BUN/Creatinine Ratio: 10 (ref 10–24)
BUN: 10 mg/dL (ref 8–27)
Bilirubin Total: 0.9 mg/dL (ref 0.0–1.2)
CO2: 24 mmol/L (ref 20–29)
Calcium: 9.2 mg/dL (ref 8.6–10.2)
Chloride: 101 mmol/L (ref 96–106)
Creatinine, Ser: 0.97 mg/dL (ref 0.76–1.27)
Globulin, Total: 2.6 g/dL (ref 1.5–4.5)
Glucose: 101 mg/dL — ABNORMAL HIGH (ref 70–99)
Potassium: 3.8 mmol/L (ref 3.5–5.2)
Sodium: 140 mmol/L (ref 134–144)
Total Protein: 6.8 g/dL (ref 6.0–8.5)
eGFR: 76 mL/min/{1.73_m2} (ref >59)

## 2021-09-16 LAB — CBC WITH DIFFERENTIAL/PLATELET
Basophils Absolute: 0 10*3/uL (ref 0.0–0.2)
Basos: 1 %
EOS (ABSOLUTE): 0.2 10*3/uL (ref 0.0–0.4)
Eos: 3 %
Hematocrit: 41.2 % (ref 37.5–51.0)
Hemoglobin: 13.6 g/dL (ref 13.0–17.7)
Immature Grans (Abs): 0 10*3/uL (ref 0.0–0.1)
Immature Granulocytes: 0 %
Lymphocytes Absolute: 1.7 10*3/uL (ref 0.7–3.1)
Lymphs: 30 %
MCH: 30 pg (ref 26.6–33.0)
MCHC: 33 g/dL (ref 31.5–35.7)
MCV: 91 fL (ref 79–97)
Monocytes Absolute: 0.6 10*3/uL (ref 0.1–0.9)
Monocytes: 11 %
Neutrophils Absolute: 3 10*3/uL (ref 1.4–7.0)
Neutrophils: 55 %
Platelets: 181 10*3/uL (ref 150–450)
RBC: 4.54 x10E6/uL (ref 4.14–5.80)
RDW: 13.2 % (ref 11.6–15.4)
WBC: 5.6 10*3/uL (ref 3.4–10.8)

## 2021-09-16 LAB — LIPID PANEL
Chol/HDL Ratio: 2.9 ratio (ref 0.0–5.0)
Cholesterol, Total: 136 mg/dL (ref 100–199)
HDL: 47 mg/dL (ref >39)
LDL Chol Calc (NIH): 70 mg/dL (ref 0–99)
Triglycerides: 106 mg/dL (ref 0–149)
VLDL Cholesterol Cal: 19 mg/dL (ref 5–40)

## 2021-09-16 LAB — TSH: TSH: 2.76 u[IU]/mL (ref 0.450–4.500)

## 2021-09-19 ENCOUNTER — Telehealth: Payer: Self-pay | Admitting: Family Medicine

## 2021-09-19 NOTE — Telephone Encounter (Signed)
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 Wants to if patient should continue with dosage or go back to dosage he was taking?

## 2021-09-19 NOTE — Telephone Encounter (Signed)
I spoke to pt's daughter and advised the furosemide 40 mg is only supposed to be 4 days and then go back to his regular dose per telephone encounter yesterday.

## 2021-09-28 ENCOUNTER — Other Ambulatory Visit: Payer: Self-pay | Admitting: Family Medicine

## 2021-10-06 ENCOUNTER — Other Ambulatory Visit: Payer: Self-pay | Admitting: Family Medicine

## 2021-10-06 ENCOUNTER — Telehealth: Payer: Self-pay | Admitting: Family Medicine

## 2021-10-06 NOTE — Telephone Encounter (Signed)
Yes I would refuse medicine at this point mainly because I do not for sure know his dose because it has been from cardiology.  We can always discuss it in the future if he wants [place] to take it over

## 2021-10-09 ENCOUNTER — Ambulatory Visit (INDEPENDENT_AMBULATORY_CARE_PROVIDER_SITE_OTHER): Payer: PPO | Admitting: Family Medicine

## 2021-10-09 ENCOUNTER — Encounter: Payer: Self-pay | Admitting: Family Medicine

## 2021-10-09 VITALS — BP 118/66 | HR 100 | Ht 71.0 in | Wt 245.0 lb

## 2021-10-09 DIAGNOSIS — E785 Hyperlipidemia, unspecified: Secondary | ICD-10-CM

## 2021-10-09 DIAGNOSIS — I10 Essential (primary) hypertension: Secondary | ICD-10-CM

## 2021-10-09 DIAGNOSIS — E7800 Pure hypercholesterolemia, unspecified: Secondary | ICD-10-CM | POA: Diagnosis not present

## 2021-10-09 DIAGNOSIS — I480 Paroxysmal atrial fibrillation: Secondary | ICD-10-CM | POA: Diagnosis not present

## 2021-10-09 DIAGNOSIS — E039 Hypothyroidism, unspecified: Secondary | ICD-10-CM | POA: Diagnosis not present

## 2021-10-09 MED ORDER — FUROSEMIDE 20 MG PO TABS: 20.0000 mg | ORAL_TABLET | Freq: Every day | ORAL | 3 refills | Status: DC

## 2021-10-09 MED ORDER — ATORVASTATIN CALCIUM 80 MG PO TABS: 80.0000 mg | ORAL_TABLET | Freq: Every day | ORAL | 3 refills | Status: DC

## 2021-10-09 MED ORDER — METOPROLOL TARTRATE 25 MG PO TABS: 25.0000 mg | ORAL_TABLET | Freq: Two times a day (BID) | ORAL | 3 refills | Status: DC

## 2021-10-09 MED ORDER — RIVAROXABAN 20 MG PO TABS: 20.0000 mg | ORAL_TABLET | Freq: Every day | ORAL | 3 refills | Status: DC

## 2021-10-09 MED ORDER — LEVOTHYROXINE SODIUM 50 MCG PO TABS: 50.0000 ug | ORAL_TABLET | Freq: Every day | ORAL | 3 refills | Status: DC

## 2021-10-09 MED ORDER — ALLOPURINOL 300 MG PO TABS: 300.0000 mg | ORAL_TABLET | Freq: Every day | ORAL | 3 refills | Status: DC

## 2021-10-09 MED ORDER — AMLODIPINE BESYLATE 10 MG PO TABS: 10.0000 mg | ORAL_TABLET | Freq: Every day | ORAL | 3 refills | Status: DC

## 2021-10-09 NOTE — Addendum Note (Signed)
Addended by: Caryl Pina on: 10/09/2021 02:18 PM   Modules accepted: Orders

## 2021-10-09 NOTE — Progress Notes (Addendum)
BP 118/66    Pulse 100    Ht _0  (1.803 m)    Wt 245 lb (111.1 kg)    SpO2 94%    BMI 34.17 kg/m    Subjective:   Patient ID: Marcus [name], male    DOB: 04-07-1933, 88 y.o.   MRN: [id number]  HPI: Marcus [name] is a 88 y.o. male presenting on 10/09/2021 for Medical Management of Chronic Issues, Hypertension, and Hyperlipidemia   HPI Hypertension Patient is currently on amlodipine and doxazosin and furosemide and metoprolol, and their blood pressure today is 118/66. Patient denies any lightheadedness or dizziness. Patient denies headaches, blurred vision, chest pains, shortness of breath, or weakness. Denies any side effects from medication and is content with current medication.   Hyperlipidemia Patient is coming in for recheck of his hyperlipidemia. The patient is currently taking atorvastatin. They deny any issues with myalgias or history of liver damage from it. They deny any focal numbness or weakness or chest pain.   Hypothyroidism recheck Patient is coming in for thyroid recheck today as well. They deny any issues with hair changes or heat or cold problems or diarrhea or constipation. They deny any chest pain or palpitations. They are currently on levothyroxine 13mcrograms   A-fib and CAD Patient takes Xarelto for A-fib and doxazosin and metoprolol.  Seems to be stable.  He denies any chest pain or palpitations.  He states that he gets the occasional exertional dyspnea  Relevant past medical, surgical, family and social history reviewed and updated as indicated. Interim medical history since our last visit reviewed. Allergies and medications reviewed and updated.  Review of Systems  Constitutional:  Negative for chills and fever.  Eyes:  Negative for visual disturbance.  Respiratory:  Negative for shortness of breath and wheezing.   Cardiovascular:  Negative for chest pain, palpitations and leg swelling.  Musculoskeletal:  Negative for back pain and gait problem.  Skin:   Negative for rash.  Neurological:  Negative for dizziness, weakness and light-headedness.  All other systems reviewed and are negative.  Per HPI unless specifically indicated above   Allergies as of 10/09/2021   No Known Allergies      Medication List        Accurate as of October 09, 2021  2:18 PM. If you have any questions, ask your nurse or doctor.          allopurinol 300 MG tablet Commonly known as: ZYLOPRIM Take 1 tablet (300 mg total) by mouth daily.   amLODipine 10 MG tablet Commonly known as: NORVASC Take 1 tablet (10 mg total) by mouth daily.   atorvastatin 80 MG tablet Commonly known as: LIPITOR Take 1 tablet (80 mg total) by mouth daily.   doxazosin 4 MG tablet Commonly known as: CARDURA 2 mg. Take 1/2 tablet by mouth daily   ferrous gluconate 324 MG tablet Commonly known as: FERGON Take 324 mg by mouth 2 (two) times daily with a meal.   furosemide 20 MG tablet Commonly known as: LASIX Take 1 tablet (20 mg total) by mouth daily.   Klor-Con M20 20 MEQ tablet Generic drug: potassium chloride SA TAKE 1 TABLET BY MOUTH TWICE A DAY   levothyroxine 50 MCG tablet Commonly known as: SYNTHROID Take 1 tablet (50 mcg total) by mouth daily.   metoprolol tartrate 25 MG tablet Commonly known as: LOPRESSOR Take 1 tablet (25 mg total) by mouth 2 (two) times daily.   rivaroxaban 20 MG Tabs tablet  Commonly known as: Xarelto Take 1 tablet (20 mg total) by mouth daily with supper. What changed:  how much to take how to take this when to take this additional instructions Changed by: Fransisca Kaufmann Xavier Munger, MD         Objective:   BP 118/66    Pulse 100    Ht _0  (1.803 m)    Wt 245 lb (111.1 kg)    SpO2 94%    BMI 34.17 kg/m   Wt Readings from Last 3 Encounters:  10/09/21 245 lb (111.1 kg)  09/15/21 254 lb (115.2 kg)  07/05/21 244 lb (110.7 kg)    Physical Exam Vitals and nursing note reviewed.  Constitutional:      General: He is not in  acute distress.    Appearance: He is well-developed. He is not diaphoretic.  Eyes:     General: No scleral icterus.    Conjunctiva/sclera: Conjunctivae normal.  Neck:     Thyroid: No thyromegaly.  Cardiovascular:     Rate and Rhythm: Normal rate. Rhythm irregular.     Heart sounds: Normal heart sounds. No murmur heard. Pulmonary:     Effort: Pulmonary effort is normal. No respiratory distress.     Breath sounds: Normal breath sounds. No wheezing.  Musculoskeletal:        General: Normal range of motion.     Cervical back: Neck supple.  Lymphadenopathy:     Cervical: No cervical adenopathy.  Skin:    General: Skin is warm and dry.     Findings: No rash.  Neurological:     Mental Status: He is alert and oriented to person, place, and time.     Coordination: Coordination normal.  Psychiatric:        Behavior: Behavior normal.      Assessment & Plan:   Problem List Items Addressed This Visit       Cardiovascular and Mediastinum   HTN (hypertension)   Relevant Medications   amLODipine (NORVASC) 10 MG tablet   atorvastatin (LIPITOR) 80 MG tablet   furosemide (LASIX) 20 MG tablet   metoprolol tartrate (LOPRESSOR) 25 MG tablet   rivaroxaban (XARELTO) 20 MG TABS tablet   Other Relevant Orders   CMP14+EGFR   Paroxysmal atrial fibrillation (HCC)   Relevant Medications   amLODipine (NORVASC) 10 MG tablet   atorvastatin (LIPITOR) 80 MG tablet   furosemide (LASIX) 20 MG tablet   metoprolol tartrate (LOPRESSOR) 25 MG tablet   rivaroxaban (XARELTO) 20 MG TABS tablet   Other Relevant Orders   CBC with Differential/Platelet     Other   Pure hypercholesterolemia - Primary   Relevant Medications   amLODipine (NORVASC) 10 MG tablet   atorvastatin (LIPITOR) 80 MG tablet   furosemide (LASIX) 20 MG tablet   metoprolol tartrate (LOPRESSOR) 25 MG tablet   rivaroxaban (XARELTO) 20 MG TABS tablet   Other Relevant Orders   Lipid panel   Dyslipidemia   Relevant Medications    atorvastatin (LIPITOR) 80 MG tablet   Other Relevant Orders   Lipid panel   Other Visit Diagnoses     Acquired hypothyroidism       Relevant Medications   levothyroxine (SYNTHROID) 50 MCG tablet   metoprolol tartrate (LOPRESSOR) 25 MG tablet   Other Relevant Orders   TSH       Continue current medicine.  No changes, will do blood work today. Follow up plan: Return in about 6 months (around 04/08/2022), or if symptoms worsen or  fail to improve, for Hypertension and cholesterol.  Counseling provided for all of the vaccine components Orders Placed This Encounter  Procedures   CBC with Differential/Platelet   CMP14+EGFR   Lipid panel   TSH    Caryl Pina, MD [facility] 10/09/2021, 2:18 PM

## 2021-10-10 LAB — CMP14+EGFR
ALT: 7 IU/L (ref 0–44)
AST: 15 IU/L (ref 0–40)
Albumin/Globulin Ratio: 1.9 (ref 1.2–2.2)
Albumin: 4.3 g/dL (ref 3.6–4.6)
Alkaline Phosphatase: 114 IU/L (ref 44–121)
BUN/Creatinine Ratio: 12 (ref 10–24)
BUN: 12 mg/dL (ref 8–27)
Bilirubin Total: 0.9 mg/dL (ref 0.0–1.2)
CO2: 26 mmol/L (ref 20–29)
Calcium: 9.2 mg/dL (ref 8.6–10.2)
Chloride: 101 mmol/L (ref 96–106)
Creatinine, Ser: 1.02 mg/dL (ref 0.76–1.27)
Globulin, Total: 2.3 g/dL (ref 1.5–4.5)
Glucose: 99 mg/dL (ref 70–99)
Potassium: 4.4 mmol/L (ref 3.5–5.2)
Sodium: 141 mmol/L (ref 134–144)
Total Protein: 6.6 g/dL (ref 6.0–8.5)
eGFR: 71 mL/min/{1.73_m2} (ref >59)

## 2021-10-10 LAB — LIPID PANEL
Chol/HDL Ratio: 3.2 ratio (ref 0.0–5.0)
Cholesterol, Total: 135 mg/dL (ref 100–199)
HDL: 42 mg/dL (ref >39)
LDL Chol Calc (NIH): 75 mg/dL (ref 0–99)
Triglycerides: 96 mg/dL (ref 0–149)
VLDL Cholesterol Cal: 18 mg/dL (ref 5–40)

## 2021-10-10 LAB — CBC WITH DIFFERENTIAL/PLATELET
Basophils Absolute: 0 10*3/uL (ref 0.0–0.2)
Basos: 1 %
EOS (ABSOLUTE): 0.2 10*3/uL (ref 0.0–0.4)
Eos: 3 %
Hematocrit: 41.9 % (ref 37.5–51.0)
Hemoglobin: 13.9 g/dL (ref 13.0–17.7)
Immature Grans (Abs): 0 10*3/uL (ref 0.0–0.1)
Immature Granulocytes: 0 %
Lymphocytes Absolute: 1.9 10*3/uL (ref 0.7–3.1)
Lymphs: 32 %
MCH: 29.9 pg (ref 26.6–33.0)
MCHC: 33.2 g/dL (ref 31.5–35.7)
MCV: 90 fL (ref 79–97)
Monocytes Absolute: 0.6 10*3/uL (ref 0.1–0.9)
Monocytes: 10 %
Neutrophils Absolute: 3.2 10*3/uL (ref 1.4–7.0)
Neutrophils: 54 %
Platelets: 193 10*3/uL (ref 150–450)
RBC: 4.65 x10E6/uL (ref 4.14–5.80)
RDW: 13.1 % (ref 11.6–15.4)
WBC: 5.9 10*3/uL (ref 3.4–10.8)

## 2021-10-10 LAB — TSH: TSH: 2.55 u[IU]/mL (ref 0.450–4.500)

## 2021-10-11 ENCOUNTER — Telehealth: Payer: Self-pay | Admitting: Family Medicine

## 2021-10-11 NOTE — Telephone Encounter (Signed)
lmtcb

## 2021-10-11 NOTE — Telephone Encounter (Signed)
Has had this prescribed by cardiology before and I do not know for sure if he is on 4 mg of doxazosin daily or if he cuts it in half and only takes 2 mg daily.  Please ask him.  I have pended the prescription but we can change it to whichever he needs.

## 2021-10-11 NOTE — Telephone Encounter (Signed)
Pt daughter calling back.

## 2021-10-12 MED ORDER — DOXAZOSIN MESYLATE 4 MG PO TABS: 2.0000 mg | ORAL_TABLET | Freq: Every day | ORAL | 3 refills | Status: DC

## 2021-10-12 NOTE — Telephone Encounter (Signed)
Daughter states he takes 1/2 pill daily- sent pended rx and daughter aware

## 2021-11-09 ENCOUNTER — Ambulatory Visit: Payer: PPO | Admitting: Urology

## 2021-11-26 ENCOUNTER — Other Ambulatory Visit: Payer: Self-pay | Admitting: Family Medicine

## 2021-11-26 DIAGNOSIS — I10 Essential (primary) hypertension: Secondary | ICD-10-CM

## 2021-11-26 DIAGNOSIS — I480 Paroxysmal atrial fibrillation: Secondary | ICD-10-CM

## 2022-01-16 ENCOUNTER — Ambulatory Visit (INDEPENDENT_AMBULATORY_CARE_PROVIDER_SITE_OTHER): Payer: PPO | Admitting: Nurse Practitioner

## 2022-01-16 ENCOUNTER — Encounter: Payer: Self-pay | Admitting: Nurse Practitioner

## 2022-01-16 VITALS — BP 132/76 | HR 73 | Temp 98.5°F | Resp 20 | Ht 71.0 in | Wt 248.0 lb

## 2022-01-16 DIAGNOSIS — G8929 Other chronic pain: Secondary | ICD-10-CM

## 2022-01-16 DIAGNOSIS — M25512 Pain in left shoulder: Secondary | ICD-10-CM

## 2022-01-16 DIAGNOSIS — M25511 Pain in right shoulder: Secondary | ICD-10-CM

## 2022-01-16 MED ORDER — PREDNISONE 20 MG PO TABS: 40.0000 mg | ORAL_TABLET | Freq: Every day | ORAL | 0 refills | Status: AC

## 2022-01-16 NOTE — Patient Instructions (Signed)

## 2022-01-16 NOTE — Progress Notes (Signed)
   Subjective:    Patient ID: Marcus [name], male    DOB: 24-Dec-1932, 88 y.o.   MRN: [id number]   Chief Complaint: bilateral shoulder pain (Having the pain for 2.5 years but now moving up his neck/)   HPI Patient has been having bil shoulder pain for over 2.5 years. Now pain is radiating up into his neck. Both shoulders hurt all the time. Rates pain 2-3/10 to 9/10 at times. Tylenol helps pain some. Pain just starts, some  times he is sitting still and other times he is moving them. Pain is starting to radiate up into his neck.     Review of Systems  Constitutional:  Negative for diaphoresis.  Eyes:  Negative for pain.  Respiratory:  Negative for shortness of breath.   Cardiovascular:  Negative for chest pain, palpitations and leg swelling.  Gastrointestinal:  Negative for abdominal pain.  Endocrine: Negative for polydipsia.  Musculoskeletal:  Positive for arthralgias (bil shoulders).  Skin:  Negative for rash.  Neurological:  Negative for dizziness, weakness and headaches.  Hematological:  Does not bruise/bleed easily.  All other systems reviewed and are negative.     Objective:   Physical Exam Constitutional:      Appearance: Normal appearance.  Cardiovascular:     Rate and Rhythm: Normal rate and regular rhythm.  Pulmonary:     Breath sounds: Normal breath sounds.  Musculoskeletal:     Comments: Limited ROM of bil shoulders with no pain. Unable to abduct past 90 degrees Grips equal bil.   Neurological:     General: No focal deficit present.     Mental Status: He is alert and oriented to person, place, and time.  Psychiatric:        Mood and Affect: Mood normal.        Behavior: Behavior normal.    BP 132/76   Pulse 73   Temp 98.5 F (36.9 C) (Temporal)   Resp 20   Ht '5\' 11"'$  (1.803 m)   Wt 248 lb (112.5 kg)   SpO2 94%   BMI 34.59 kg/m        Assessment & Plan:  Anikin L Pries in today with chief complaint of bilateral shoulder pain (Having the pain for 2.5  years but now moving up his neck/)   1. Chronic pain of both shoulders Ice bid Rest If not  improving will do xray and joint injections ( no xray available today) - predniSONE (DELTASONE) 20 MG tablet; Take 2 tablets (40 mg total) by mouth daily with breakfast for 5 days. 2 po daily for 5 days  Dispense: 10 tablet; Refill: 0    The above assessment and management plan was discussed with the patient. The patient verbalized understanding of and has agreed to the management plan. Patient is aware to call the clinic if symptoms persist or worsen. Patient is aware when to return to the clinic for a follow-up visit. Patient educated on when it is appropriate to go to the emergency department.   Mary-Margaret Hassell Done, FNP

## 2022-02-07 ENCOUNTER — Ambulatory Visit: Payer: PPO | Admitting: Family Medicine

## 2022-03-15 ENCOUNTER — Ambulatory Visit: Payer: PPO | Admitting: Family Medicine

## 2022-03-16 ENCOUNTER — Telehealth: Payer: Self-pay | Admitting: Family Medicine

## 2022-03-16 NOTE — Telephone Encounter (Signed)
Left message for patient to call back and schedule Medicare Annual Wellness Visit (AWV) to be completed by video or phone.   Last AWV: 03/14/2021  Please schedule at anytime with [facility]     45 minute appointment  Any questions, please contact me at [phone]

## 2022-03-29 ENCOUNTER — Other Ambulatory Visit: Payer: Self-pay | Admitting: Family Medicine

## 2022-04-05 DIAGNOSIS — H04123 Dry eye syndrome of bilateral lacrimal glands: Secondary | ICD-10-CM | POA: Diagnosis not present

## 2022-04-05 DIAGNOSIS — H40033 Anatomical narrow angle, bilateral: Secondary | ICD-10-CM | POA: Diagnosis not present

## 2022-04-09 ENCOUNTER — Ambulatory Visit (INDEPENDENT_AMBULATORY_CARE_PROVIDER_SITE_OTHER): Payer: PPO | Admitting: Family Medicine

## 2022-04-09 ENCOUNTER — Encounter: Payer: Self-pay | Admitting: Family Medicine

## 2022-04-09 VITALS — BP 123/72 | HR 99 | Temp 98.0°F | Ht 71.0 in | Wt 243.0 lb

## 2022-04-09 DIAGNOSIS — I10 Essential (primary) hypertension: Secondary | ICD-10-CM

## 2022-04-09 DIAGNOSIS — E7800 Pure hypercholesterolemia, unspecified: Secondary | ICD-10-CM

## 2022-04-09 DIAGNOSIS — E785 Hyperlipidemia, unspecified: Secondary | ICD-10-CM

## 2022-04-09 DIAGNOSIS — I480 Paroxysmal atrial fibrillation: Secondary | ICD-10-CM

## 2022-04-09 MED ORDER — FUROSEMIDE 20 MG PO TABS: 20.0000 mg | ORAL_TABLET | Freq: Every day | ORAL | 1 refills | Status: DC

## 2022-04-09 NOTE — Progress Notes (Signed)
BP 123/72   Pulse 99   Temp 98 F (36.7 C)   Ht '5\' 11"'  (1.803 m)   Wt 243 lb (110.2 kg)   SpO2 95%   BMI 33.89 kg/m    Subjective:   Patient ID: Marcus [name], male    DOB: 05-26-1933, 88 y.o.   MRN: [id number]  HPI: Marcus [name] is a 88 y.o. male presenting on 04/09/2022 for Medical Management of Chronic Issues, Hyperlipidemia, and Hypertension   HPI Hyperlipidemia Patient is coming in for recheck of his hyperlipidemia. The patient is currently taking Lipitor. They deny any issues with myalgias or history of liver damage from it. They deny any focal numbness or weakness or chest pain.   Hypertension Patient is currently on amlodipine and doxazosin and metoprolol, and their blood pressure today is 123/72. Patient denies any lightheadedness or dizziness. Patient denies headaches, blurred vision, chest pains, shortness of breath, or weakness. Denies any side effects from medication and is content with current medication.   Paroxysmal A-fib recheck Patient is coming in for recheck for paroxysmal A-fib, still sees cardiology as well but is currently on Xarelto for anticoagulation.  Denies any bruising or bleeding.  He denies any chest pain or shortness of breath palpitations.  Relevant past medical, surgical, family and social history reviewed and updated as indicated. Interim medical history since our last visit reviewed. Allergies and medications reviewed and updated.  Review of Systems  Constitutional:  Negative for chills and fever.  Eyes:  Negative for visual disturbance.  Respiratory:  Negative for shortness of breath and wheezing.   Cardiovascular:  Negative for chest pain and leg swelling.  Musculoskeletal:  Negative for back pain and gait problem.  Skin:  Negative for rash.  All other systems reviewed and are negative.   Per HPI unless specifically indicated above   Allergies as of 04/09/2022   No Known Allergies      Medication List        Accurate as of  April 09, 2022 10:39 AM. If you have any questions, ask your nurse or doctor.          allopurinol 300 MG tablet Commonly known as: ZYLOPRIM Take 1 tablet (300 mg total) by mouth daily.   amLODipine 10 MG tablet Commonly known as: NORVASC Take 1 tablet (10 mg total) by mouth daily.   atorvastatin 80 MG tablet Commonly known as: LIPITOR Take 1 tablet (80 mg total) by mouth daily.   doxazosin 4 MG tablet Commonly known as: CARDURA Take 0.5 tablets (2 mg total) by mouth daily. Take 1/2 tablet by mouth daily   ferrous gluconate 324 MG tablet Commonly known as: FERGON Take 324 mg by mouth 2 (two) times daily with a meal.   furosemide 20 MG tablet Commonly known as: LASIX Take 1 tablet (20 mg total) by mouth daily.   Klor-Con M20 20 MEQ tablet Generic drug: potassium chloride SA TAKE 1 TABLET BY MOUTH TWICE A DAY   levothyroxine 50 MCG tablet Commonly known as: SYNTHROID Take 1 tablet (50 mcg total) by mouth daily.   metoprolol tartrate 25 MG tablet Commonly known as: LOPRESSOR Take 1 tablet (25 mg total) by mouth 2 (two) times daily.   rivaroxaban 20 MG Tabs tablet Commonly known as: Xarelto Take 1 tablet (20 mg total) by mouth daily with supper.         Objective:   BP 123/72   Pulse 99   Temp 98 F (36.7 C)  Ht '5\' 11"'  (1.803 m)   Wt 243 lb (110.2 kg)   SpO2 95%   BMI 33.89 kg/m   Wt Readings from Last 3 Encounters:  04/09/22 243 lb (110.2 kg)  01/16/22 248 lb (112.5 kg)  10/09/21 245 lb (111.1 kg)    Physical Exam Vitals and nursing note reviewed.  Constitutional:      General: He is not in acute distress.    Appearance: He is well-developed. He is not diaphoretic.  Eyes:     General: No scleral icterus.    Conjunctiva/sclera: Conjunctivae normal.  Neck:     Thyroid: No thyromegaly.  Cardiovascular:     Rate and Rhythm: Normal rate. Rhythm irregular.     Heart sounds: Normal heart sounds. No murmur heard. Pulmonary:     Effort: Pulmonary  effort is normal. No respiratory distress.     Breath sounds: Normal breath sounds. No wheezing.  Musculoskeletal:        General: No swelling. Normal range of motion.     Cervical back: Neck supple.  Lymphadenopathy:     Cervical: No cervical adenopathy.  Skin:    General: Skin is warm and dry.     Findings: No rash.  Neurological:     Mental Status: He is alert and oriented to person, place, and time.     Coordination: Coordination normal.  Psychiatric:        Behavior: Behavior normal.       Assessment & Plan:   Problem List Items Addressed This Visit       Cardiovascular and Mediastinum   HTN (hypertension)   Relevant Medications   furosemide (LASIX) 20 MG tablet   Other Relevant Orders   CBC with Differential/Platelet   CMP14+EGFR   Paroxysmal atrial fibrillation (HCC)   Relevant Medications   furosemide (LASIX) 20 MG tablet   Other Relevant Orders   CBC with Differential/Platelet   CMP14+EGFR     Other   Pure hypercholesterolemia - Primary   Relevant Medications   furosemide (LASIX) 20 MG tablet   Other Relevant Orders   Lipid panel   Dyslipidemia   Relevant Orders   Lipid panel    No change in medication, will continue current medications, will send for labs follow-up in 6 months Follow up plan: Return in about 6 months (around 10/10/2022), or if symptoms worsen or fail to improve, for A-fib hyperlipidemia and hypertension.  Counseling provided for all of the vaccine components Orders Placed This Encounter  Procedures   CBC with Differential/Platelet   CMP14+EGFR   Lipid panel    Caryl Pina, MD [facility] 04/09/2022, 10:39 AM

## 2022-04-10 ENCOUNTER — Ambulatory Visit (INDEPENDENT_AMBULATORY_CARE_PROVIDER_SITE_OTHER): Payer: PPO | Admitting: Nurse Practitioner

## 2022-04-10 ENCOUNTER — Encounter: Payer: Self-pay | Admitting: Nurse Practitioner

## 2022-04-10 DIAGNOSIS — Z0289 Encounter for other administrative examinations: Secondary | ICD-10-CM | POA: Diagnosis not present

## 2022-04-10 DIAGNOSIS — Z024 Encounter for examination for driving license: Secondary | ICD-10-CM

## 2022-04-10 LAB — CBC WITH DIFFERENTIAL/PLATELET
Basophils Absolute: 0 10*3/uL (ref 0.0–0.2)
Basos: 0 %
EOS (ABSOLUTE): 0.2 10*3/uL (ref 0.0–0.4)
Eos: 4 %
Hematocrit: 40.8 % (ref 37.5–51.0)
Hemoglobin: 13.6 g/dL (ref 13.0–17.7)
Immature Grans (Abs): 0 10*3/uL (ref 0.0–0.1)
Immature Granulocytes: 0 %
Lymphocytes Absolute: 1.7 10*3/uL (ref 0.7–3.1)
Lymphs: 26 %
MCH: 30.6 pg (ref 26.6–33.0)
MCHC: 33.3 g/dL (ref 31.5–35.7)
MCV: 92 fL (ref 79–97)
Monocytes Absolute: 0.7 10*3/uL (ref 0.1–0.9)
Monocytes: 10 %
Neutrophils Absolute: 4.1 10*3/uL (ref 1.4–7.0)
Neutrophils: 60 %
Platelets: 175 10*3/uL (ref 150–450)
RBC: 4.44 x10E6/uL (ref 4.14–5.80)
RDW: 12.6 % (ref 11.6–15.4)
WBC: 6.8 10*3/uL (ref 3.4–10.8)

## 2022-04-10 LAB — CMP14+EGFR
ALT: 9 IU/L (ref 0–44)
AST: 17 IU/L (ref 0–40)
Albumin/Globulin Ratio: 1.6 (ref 1.2–2.2)
Albumin: 4.1 g/dL (ref 3.7–4.7)
Alkaline Phosphatase: 107 IU/L (ref 44–121)
BUN/Creatinine Ratio: 11 (ref 10–24)
BUN: 11 mg/dL (ref 8–27)
Bilirubin Total: 1 mg/dL (ref 0.0–1.2)
CO2: 26 mmol/L (ref 20–29)
Calcium: 9.1 mg/dL (ref 8.6–10.2)
Chloride: 98 mmol/L (ref 96–106)
Creatinine, Ser: 0.99 mg/dL (ref 0.76–1.27)
Globulin, Total: 2.6 g/dL (ref 1.5–4.5)
Glucose: 91 mg/dL (ref 70–99)
Potassium: 4.1 mmol/L (ref 3.5–5.2)
Sodium: 138 mmol/L (ref 134–144)
Total Protein: 6.7 g/dL (ref 6.0–8.5)
eGFR: 74 mL/min/{1.73_m2} (ref >59)

## 2022-04-10 LAB — URINALYSIS
Bilirubin, UA: NEGATIVE
Glucose, UA: NEGATIVE
Nitrite, UA: NEGATIVE
Protein,UA: NEGATIVE
RBC, UA: NEGATIVE
Specific Gravity, UA: 1.02 (ref 1.005–1.030)
Urobilinogen, Ur: 1 mg/dL (ref 0.2–1.0)
pH, UA: 5.5 (ref 5.0–7.5)

## 2022-04-10 LAB — LIPID PANEL
Chol/HDL Ratio: 2.9 ratio (ref 0.0–5.0)
Cholesterol, Total: 134 mg/dL (ref 100–199)
HDL: 47 mg/dL (ref >39)
LDL Chol Calc (NIH): 66 mg/dL (ref 0–99)
Triglycerides: 116 mg/dL (ref 0–149)
VLDL Cholesterol Cal: 21 mg/dL (ref 5–40)

## 2022-04-10 NOTE — Progress Notes (Signed)
[employer]- see scanned in document 

## 2022-06-19 NOTE — Progress Notes (Unsigned)
Cardiology Office Note   Date:  06/19/2022   ID:  Marcus [name], DOB 01-Jul-1933, MRN [id number]  PCP:  Dettinger, Fransisca Kaufmann, MD  Cardiologist:   None   No chief complaint on file.     History of Present Illness: Marcus [name] is a 88 y.o. male who presents for followup of coronary disease status post CABG and atrial flutter. He has been cardioverted. Subsequent follow-up in the atrial fibrillation clinic is demonstrated normal sinus rhythm.    Since I last saw him ***  *** he has done remarkably well again.  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  He gets up and down the Which is the most exerting thing he does. The patient denies any new symptoms such as chest discomfort, neck or arm discomfort. There has been no new shortness of breath, PND or orthopnea. There have been no reported palpitations, presyncope or syncope.    Of note he apparently was diagnosed with hypothyroidism and is on replacement therapy since I last saw him.   Past Medical History:  Diagnosis Date   Angina    Arthritis    Atrial flutter ([place])    Chronic kidney disease    hx of BPH   Coronary atherosclerosis of native coronary artery    Gout    Hypertension    Myocardial infarction ([place])    Other and unspecified hyperlipidemia    Pure hypercholesterolemia     Past Surgical History:  Procedure Laterality Date   CATARACTS     CHEST TUBE INSERTION  11/22/2011   Procedure: CHEST TUBE INSERTION;  Surgeon: Ivin Poot, MD;  Location: [facility];  Service: Thoracic;  Laterality: Right;   CORONARY ARTERY BYPASS GRAFT  11/20/2011   Procedure: CORONARY ARTERY BYPASS GRAFTING (CABG);  Surgeon: Ivin Poot, MD;  Location: [facility];  Service: Open Heart Surgery;  Laterality: N/A;  Times 3. On Pump. Using endoscopically harvested right greater saphenous vein and left internal mammary artery.    ELECTROPHYSIOLOGIC STUDY N/A 06/27/2015   Procedure: Cardioversion;  Surgeon: Deboraha Sprang, MD;  Location:  [address] [facility];  Service: Cardiovascular;  Laterality: N/A;   LEFT AND RIGHT HEART CATHETERIZATION WITH CORONARY ANGIOGRAM N/A 11/16/2011   Procedure: LEFT AND RIGHT HEART CATHETERIZATION WITH CORONARY ANGIOGRAM;  Surgeon: Sherren Mocha, MD;  Location: [facility] CATH LAB;  Service: Cardiovascular;  Laterality: N/A;   MAZE  11/20/2011   Procedure: MAZE;  Surgeon: Ivin Poot, MD;  Location: [facility];  Service: Open Heart Surgery;  Laterality: N/A;     Current Outpatient Medications  Medication Sig Dispense Refill   allopurinol (ZYLOPRIM) 300 MG tablet Take 1 tablet (300 mg total) by mouth daily. 90 tablet 3   amLODipine (NORVASC) 10 MG tablet Take 1 tablet (10 mg total) by mouth daily. 90 tablet 3   atorvastatin (LIPITOR) 80 MG tablet Take 1 tablet (80 mg total) by mouth daily. 90 tablet 3   doxazosin (CARDURA) 4 MG tablet Take 0.5 tablets (2 mg total) by mouth daily. Take 1/2 tablet by mouth daily 90 tablet 3   ferrous gluconate (FERGON) 324 MG tablet Take 324 mg by mouth 2 (two) times daily with a meal.     furosemide (LASIX) 20 MG tablet Take 1 tablet (20 mg total) by mouth daily. 90 tablet 1   KLOR-CON M20 20 MEQ tablet TAKE 1 TABLET BY MOUTH TWICE A DAY 180 tablet 0   levothyroxine (SYNTHROID) 50  MCG tablet Take 1 tablet (50 mcg total) by mouth daily. 90 tablet 3   metoprolol tartrate (LOPRESSOR) 25 MG tablet Take 1 tablet (25 mg total) by mouth 2 (two) times daily. 180 tablet 3   rivaroxaban (XARELTO) 20 MG TABS tablet Take 1 tablet (20 mg total) by mouth daily with supper. 90 tablet 3   No current facility-administered medications for this visit.    Allergies:   Patient has no known allergies.    ROS:  Please see the history of present illness.   Otherwise, review of systems are positive for ***.   All other systems are reviewed and negative.    PHYSICAL EXAM: VS:  There were no vitals taken for this visit. , BMI There is no height or weight on file to calculate BMI. GENERAL:  Well  appearing NECK:  No jugular venous distention, waveform within normal limits, carotid upstroke brisk and symmetric, no bruits, no thyromegaly LUNGS:  Clear to auscultation bilaterally CHEST:  Unremarkable HEART:  PMI not displaced or sustained,S1 and S2 within normal limits, no S3, no S4, no clicks, no rubs, *** murmurs ABD:  Flat, positive bowel sounds normal in frequency in pitch, no bruits, no rebound, no guarding, no midline pulsatile mass, no hepatomegaly, no splenomegaly EXT:  2 plus pulses throughout, no edema, no cyanosis no clubbing       ***GENERAL:  Well appearing NECK:  No jugular venous distention, waveform within normal limits, carotid upstroke brisk and symmetric, no bruits, no thyromegaly LUNGS:  Clear to auscultation bilaterally CHEST:  Well healed sternotomy scar. HEART:  PMI not displaced or sustained,S1 and S2 within normal limits, no S3, no clicks, no rubs, no murmurs, irregular  ABD:  Flat, positive bowel sounds normal in frequency in pitch, no bruits, no rebound, no guarding, no midline pulsatile mass, no hepatomegaly, no splenomegaly EXT:  2 plus pulses throughout, mild right greater than left edema, no cyanosis no clubbing   EKG:  EKG is *** ordered today. The ekg ordered today demonstrates atrial fibrillation, rate ***, left axis deviation, low voltage in the limb and chest leads, poor anterior R wave progression, nonspecific diffuse T wave flattening.  No change from previous   Recent Labs: 10/09/2021: TSH 2.550 04/09/2022: ALT 9; BUN 11; Creatinine, Ser 0.99; Hemoglobin 13.6; Platelets 175; Potassium 4.1; Sodium 138    Lipid Panel    Component Value Date/Time   CHOL 134 04/09/2022 1049   TRIG 116 04/09/2022 1049   HDL 47 04/09/2022 1049   CHOLHDL 2.9 04/09/2022 1049   CHOLHDL 3.8 05/17/2014 0941   VLDL 30 05/17/2014 0941   LDLCALC 66 04/09/2022 1049      Wt Readings from Last 3 Encounters:  04/09/22 243 lb (110.2 kg)  01/16/22 248 lb (112.5 kg)   10/09/21 245 lb (111.1 kg)      Other studies Reviewed: Additional studies/ records that were reviewed today include: *** Review of the above records demonstrates:  Please see elsewhere in the note.     ASSESSMENT AND PLAN:  CAD:    ***  The patient has no new sypmtoms.  No further cardiovascular testing is indicated.  We will continue with aggressive risk reduction and meds as listed.  ATRIAL FLUTTER:    Marcus [name] has a CHA2DS2 - VASc score of 4 .  ***   He tolerates anticoagulation and has had good rate control.  No change in therapy.   He is up-to-date with blood work and is  on the appropriate dose and has no evidence of anemia.  HTN:  The blood pressure is *** at target.  No change in therapy.   DYSLIPIDEMIA: LDL was *** 68 with an HDL of 44 earlier this year.  No change in therapy.   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     Current medicines are reviewed at length with the patient today.  The patient does not have concerns regarding medicines.  The following changes have been made:  ***  Labs/ tests ordered today include: ***  No orders of the defined types were placed in this encounter.    Disposition:   FU with me in *** .     Signed, Minus Breeding, MD  06/19/2022 5:34 PM    [facility]

## 2022-06-20 ENCOUNTER — Encounter: Payer: Self-pay | Admitting: Cardiology

## 2022-06-20 ENCOUNTER — Ambulatory Visit (INDEPENDENT_AMBULATORY_CARE_PROVIDER_SITE_OTHER): Payer: PPO | Admitting: Cardiology

## 2022-06-20 VITALS — BP 120/80 | HR 95

## 2022-06-20 DIAGNOSIS — I2510 Atherosclerotic heart disease of native coronary artery without angina pectoris: Secondary | ICD-10-CM | POA: Diagnosis not present

## 2022-06-20 DIAGNOSIS — E785 Hyperlipidemia, unspecified: Secondary | ICD-10-CM

## 2022-06-20 DIAGNOSIS — I4892 Unspecified atrial flutter: Secondary | ICD-10-CM

## 2022-06-20 DIAGNOSIS — I10 Essential (primary) hypertension: Secondary | ICD-10-CM | POA: Diagnosis not present

## 2022-06-20 NOTE — Patient Instructions (Signed)
Medication Instructions:  The current medical regimen is effective;  continue present plan and medications.  *If you need a refill on your cardiac medications before your next appointment, please call your pharmacy*  Follow-Up: At [facility], you and your health needs are our priority.  As part of our continuing mission to provide you with exceptional heart care, we have created designated Provider Care Teams.  These Care Teams include your primary Cardiologist (physician) and Advanced Practice Providers (APPs -  Physician Assistants and Nurse Practitioners) who all work together to provide you with the care you need, when you need it.  We recommend signing up for the patient portal called "MyChart".  Sign up information is provided on this After Visit Summary.  MyChart is used to connect with patients for Virtual Visits (Telemedicine).  Patients are able to view lab/test results, encounter notes, upcoming appointments, etc.  Non-urgent messages can be sent to your provider as well.   To learn more about what you can do with MyChart, go to [email or web address].    Your next appointment:   1 year(s)  The format for your next appointment:   In Person  Provider:   Minus Breeding, MD     Hydrocortisone cream   Important Information About Sugar

## 2022-06-23 ENCOUNTER — Other Ambulatory Visit: Payer: Self-pay | Admitting: Family Medicine

## 2022-08-13 ENCOUNTER — Ambulatory Visit (INDEPENDENT_AMBULATORY_CARE_PROVIDER_SITE_OTHER): Payer: PPO

## 2022-08-13 VITALS — Ht 71.0 in | Wt 240.0 lb

## 2022-08-13 DIAGNOSIS — Z0000 Encounter for general adult medical examination without abnormal findings: Secondary | ICD-10-CM | POA: Diagnosis not present

## 2022-08-13 NOTE — Patient Instructions (Signed)
Marcus [name] , Thank you for taking time to come for your Medicare Wellness Visit. I appreciate your ongoing commitment to your health goals. Please review the following plan we discussed and let me know if I can assist you in the future.   These are the goals we discussed:  Goals       Have 3 meals a day (pt-stated)      Increase fruits and veggies into his diet      Patient Stated      03/14/2021 AWV Goal: Fall Prevention  Over the next year, patient will decrease their risk for falls by: Using assistive devices, such as a cane or walker, as needed Identifying fall risks within their home and correcting them by: Removing throw rugs Adding handrails to stairs or ramps Removing clutter and keeping a clear pathway throughout the home Increasing light, especially at night Adding shower handles/bars Raising toilet seat Identifying potential personal risk factors for falls: Medication side effects Incontinence/urgency Vestibular dysfunction Hearing loss Musculoskeletal disorders Neurological disorders Orthostatic hypotension          This is a list of the screening recommended for you and due dates:  Health Maintenance  Topic Date Due   Zoster (Shingles) Vaccine (1 of 2) Never done   Flu Shot  03/27/2022   COVID-19 Vaccine (7 - 2023-24 season) 08/13/2022   Pneumonia Vaccine (1 - PCV) 04/09/2023*   DTaP/Tdap/Td vaccine (2 - Tdap) 08/27/2022   Medicare Annual Wellness Visit  08/14/2023   HPV Vaccine  Aged Out  *Topic was postponed. The date shown is not the original due date.    Advanced directives: Advance directive discussed with you today. Even though you declined this today, please call our office should you change your mind, and we can give you the proper paperwork for you to fill out.   Conditions/risks identified: Aim for 30 minutes of exercise or brisk walking, 6-8 glasses of water, and 5 servings of fruits and vegetables each day.   Next appointment: Follow up in  one year for your annual wellness visit.   Preventive Care 38 Years and Older, Male  Preventive care refers to lifestyle choices and visits with your health care provider that can promote health and wellness. What does preventive care include? A yearly physical exam. This is also called an annual well check. Dental exams once or twice a year. Routine eye exams. Ask your health care provider how often you should have your eyes checked. Personal lifestyle choices, including: Daily care of your teeth and gums. Regular physical activity. Eating a healthy diet. Avoiding tobacco and drug use. Limiting alcohol use. Practicing safe sex. Taking low doses of aspirin every day. Taking vitamin and mineral supplements as recommended by your health care provider. What happens during an annual well check? The services and screenings done by your health care provider during your annual well check will depend on your age, overall health, lifestyle risk factors, and family history of disease. Counseling  Your health care provider may ask you questions about your: Alcohol use. Tobacco use. Drug use. Emotional well-being. Home and relationship well-being. Sexual activity. Eating habits. History of falls. Memory and ability to understand (cognition). Work and work Statistician. Screening  You may have the following tests or measurements: Height, weight, and BMI. Blood pressure. Lipid and cholesterol levels. These may be checked every 5 years, or more frequently if you are over 58 years old. Skin check. Lung cancer screening. You may have this screening every  year starting at age 71 if you have a 30-pack-year history of smoking and currently smoke or have quit within the past 15 years. Fecal occult blood test (FOBT) of the stool. You may have this test every year starting at age 78. Flexible sigmoidoscopy or colonoscopy. You may have a sigmoidoscopy every 5 years or a colonoscopy every 10 years  starting at age 40. Prostate cancer screening. Recommendations will vary depending on your family history and other risks. Hepatitis C blood test. Hepatitis B blood test. Sexually transmitted disease (STD) testing. Diabetes screening. This is done by checking your blood sugar (glucose) after you have not eaten for a while (fasting). You may have this done every 1-3 years. Abdominal aortic aneurysm (AAA) screening. You may need this if you are a current or former smoker. Osteoporosis. You may be screened starting at age 47 if you are at high risk. Talk with your health care provider about your test results, treatment options, and if necessary, the need for more tests. Vaccines  Your health care provider may recommend certain vaccines, such as: Influenza vaccine. This is recommended every year. Tetanus, diphtheria, and acellular pertussis (Tdap, Td) vaccine. You may need a Td booster every 10 years. Zoster vaccine. You may need this after age 90. Pneumococcal 13-valent conjugate (PCV13) vaccine. One dose is recommended after age 55. Pneumococcal polysaccharide (PPSV23) vaccine. One dose is recommended after age 80. Talk to your health care provider about which screenings and vaccines you need and how often you need them. This information is not intended to replace advice given to you by your health care provider. Make sure you discuss any questions you have with your health care provider. Document Released: 09/09/2015 Document Revised: 05/02/2016 Document Reviewed: 06/14/2015 Elsevier Interactive Patient Education  2017 [place] Prevention in the Home Falls can cause injuries. They can happen to people of all ages. There are many things you can do to make your home safe and to help prevent falls. What can I do on the outside of my home? Regularly fix the edges of walkways and driveways and fix any cracks. Remove anything that might make you trip as you walk through a door, such as  a raised step or threshold. Trim any bushes or trees on the path to your home. Use bright outdoor lighting. Clear any walking paths of anything that might make someone trip, such as rocks or tools. Regularly check to see if handrails are loose or broken. Make sure that both sides of any steps have handrails. Any raised decks and porches should have guardrails on the edges. Have any leaves, snow, or ice cleared regularly. Use sand or salt on walking paths during winter. Clean up any spills in your garage right away. This includes oil or grease spills. What can I do in the bathroom? Use night lights. Install grab bars by the toilet and in the tub and shower. Do not use towel bars as grab bars. Use non-skid mats or decals in the tub or shower. If you need to sit down in the shower, use a plastic, non-slip stool. Keep the floor dry. Clean up any water that spills on the floor as soon as it happens. Remove soap buildup in the tub or shower regularly. Attach bath mats securely with double-sided non-slip rug tape. Do not have throw rugs and other things on the floor that can make you trip. What can I do in the bedroom? Use night lights. Make sure that you have a  light by your bed that is easy to reach. Do not use any sheets or blankets that are too big for your bed. They should not hang down onto the floor. Have a firm chair that has side arms. You can use this for support while you get dressed. Do not have throw rugs and other things on the floor that can make you trip. What can I do in the kitchen? Clean up any spills right away. Avoid walking on wet floors. Keep items that you use a lot in easy-to-reach places. If you need to reach something above you, use a strong step stool that has a grab bar. Keep electrical cords out of the way. Do not use floor polish or wax that makes floors slippery. If you must use wax, use non-skid floor wax. Do not have throw rugs and other things on the floor  that can make you trip. What can I do with my stairs? Do not leave any items on the stairs. Make sure that there are handrails on both sides of the stairs and use them. Fix handrails that are broken or loose. Make sure that handrails are as long as the stairways. Check any carpeting to make sure that it is firmly attached to the stairs. Fix any carpet that is loose or worn. Avoid having throw rugs at the top or bottom of the stairs. If you do have throw rugs, attach them to the floor with carpet tape. Make sure that you have a light switch at the top of the stairs and the bottom of the stairs. If you do not have them, ask someone to add them for you. What else can I do to help prevent falls? Wear shoes that: Do not have high heels. Have rubber bottoms. Are comfortable and fit you well. Are closed at the toe. Do not wear sandals. If you use a stepladder: Make sure that it is fully opened. Do not climb a closed stepladder. Make sure that both sides of the stepladder are locked into place. Ask someone to hold it for you, if possible. Clearly mark and make sure that you can see: Any grab bars or handrails. First and last steps. Where the edge of each step is. Use tools that help you move around (mobility aids) if they are needed. These include: Canes. Walkers. Scooters. Crutches. Turn on the lights when you go into a dark area. Replace any light bulbs as soon as they burn out. Set up your furniture so you have a clear path. Avoid moving your furniture around. If any of your floors are uneven, fix them. If there are any pets around you, be aware of where they are. Review your medicines with your doctor. Some medicines can make you feel dizzy. This can increase your chance of falling. Ask your doctor what other things that you can do to help prevent falls. This information is not intended to replace advice given to you by your health care provider. Make sure you discuss any questions you  have with your health care provider. Document Released: 06/09/2009 Document Revised: 01/19/2016 Document Reviewed: 09/17/2014 Elsevier Interactive Patient Education  2017 [employer].

## 2022-08-13 NOTE — Progress Notes (Signed)
Subjective:   Marcus [name] is a 88 y.o. male who presents for Medicare Annual/Subsequent preventive examination.  I connected with  Jden L Fei on 08/13/22 by a audio enabled telemedicine application and verified that I am speaking with the correct person using two identifiers.  Patient Location: Home  Provider Location: Home Office  I discussed the limitations of evaluation and management by telemedicine. The patient expressed understanding and agreed to proceed.  Review of Systems     Cardiac Risk Factors include: advanced age (>66mn, >>102women);dyslipidemia;hypertension;male gender     Objective:    Today's Vitals   08/13/22 1451  Weight: 240 lb (108.9 kg)  Height: '5\' 11"'$  (1.803 m)   Body mass index is 33.47 kg/m.     08/13/2022    2:53 PM 03/14/2021    4:05 PM 01/15/2019    2:39 PM 07/22/2018    5:51 AM 06/25/2015   11:00 PM 06/25/2015    7:22 PM 11/15/2011    5:39 PM  Advanced Directives  Does Patient Have a Medical Advance Directive? No No Yes No No No Patient does not have advance directive  Type of ANurse, adultPower of [place] will      Copy of [place] Chart?   No - copy requested      Would patient like information on creating a medical advance directive? No - Patient declined No - Patient declined  Yes (ED - Information included in AVS) No - patient declined information No - patient declined information   Pre-existing out of facility DNR order (yellow form or pink MOST form)       No    Current Medications (verified) Outpatient Encounter Medications as of 08/13/2022  Medication Sig   allopurinol (ZYLOPRIM) 300 MG tablet Take 1 tablet (300 mg total) by mouth daily.   amLODipine (NORVASC) 10 MG tablet Take 1 tablet (10 mg total) by mouth daily.   atorvastatin (LIPITOR) 80 MG tablet Take 1 tablet (80 mg total) by mouth daily.   doxazosin (CARDURA) 4 MG tablet Take 0.5 tablets (2 mg total) by mouth daily.  Take 1/2 tablet by mouth daily   ferrous gluconate (FERGON) 324 MG tablet Take 324 mg by mouth 2 (two) times daily with a meal.   furosemide (LASIX) 20 MG tablet Take 1 tablet (20 mg total) by mouth daily.   KLOR-CON M20 20 MEQ tablet TAKE 1 TABLET BY MOUTH TWICE A DAY   levothyroxine (SYNTHROID) 50 MCG tablet Take 1 tablet (50 mcg total) by mouth daily.   metoprolol tartrate (LOPRESSOR) 25 MG tablet Take 1 tablet (25 mg total) by mouth 2 (two) times daily. (Patient taking differently: Take 25 mg by mouth 2 (two) times daily. Take 1/2 tablet by mouth twice daily)   rivaroxaban (XARELTO) 20 MG TABS tablet Take 1 tablet (20 mg total) by mouth daily with supper.   No facility-administered encounter medications on file as of 08/13/2022.    Allergies (verified) Patient has no known allergies.   History: Past Medical History:  Diagnosis Date   Angina    Arthritis    Atrial flutter (HCC)    Chronic kidney disease    hx of BPH   Coronary atherosclerosis of native coronary artery    Gout    Hypertension    [place]    Other and unspecified hyperlipidemia    Pure hypercholesterolemia    Past Surgical History:  Procedure Laterality Date  CATARACTS     CHEST TUBE INSERTION  11/22/2011   Procedure: CHEST TUBE INSERTION;  Surgeon: Ivin Poot, MD;  Location: [facility];  Service: Thoracic;  Laterality: Right;   CORONARY ARTERY BYPASS GRAFT  11/20/2011   Procedure: CORONARY ARTERY BYPASS GRAFTING (CABG);  Surgeon: Ivin Poot, MD;  Location: [facility];  Service: Open Heart Surgery;  Laterality: N/A;  Times 3. On Pump. Using endoscopically harvested right greater saphenous vein and left internal mammary artery.    ELECTROPHYSIOLOGIC STUDY N/A 06/27/2015   Procedure: Cardioversion;  Surgeon: Deboraha Sprang, MD;  Location: [facility];  Service: Cardiovascular;  Laterality: N/A;   LEFT AND RIGHT HEART CATHETERIZATION WITH CORONARY ANGIOGRAM N/A 11/16/2011   Procedure: LEFT  AND RIGHT HEART CATHETERIZATION WITH CORONARY ANGIOGRAM;  Surgeon: Sherren Mocha, MD;  Location: [facility] CATH LAB;  Service: Cardiovascular;  Laterality: N/A;   MAZE  11/20/2011   Procedure: MAZE;  Surgeon: Ivin Poot, MD;  Location: [facility];  Service: Open Heart Surgery;  Laterality: N/A;   Family History  Problem Relation Age of Onset   Colon cancer Son    Social History   Socioeconomic History   Marital status: Widowed    Spouse name: [name]   Number of children: 2   Years of education: Not on file   Highest education level: High school graduate  Occupational History   Occupation: works with "heavy equipment"  Tobacco Use   Smoking status: Never   Smokeless tobacco: Never  Vaping Use   Vaping Use: Never used  Substance and Sexual Activity   Alcohol use: Not Currently    Comment: hasn't had an alcoholic drink in 7 years   Drug use: No   Sexual activity: Never    Birth control/protection: None  Other Topics Concern   Not on file  Social History Narrative   Not on file   Social Determinants of Health   Financial Resource Strain: Low Risk  (08/13/2022)   Overall Financial Resource Strain (CARDIA)    Difficulty of Paying Living Expenses: Not hard at all  Food Insecurity: No Food Insecurity (08/13/2022)   Hunger Vital Sign    Worried About Running Out of Food in the Last Year: Never true    [place] in the Last Year: Never true  Transportation Needs: No Transportation Needs (08/13/2022)   PRAPARE - Hydrologist (Medical): No    Lack of Transportation (Non-Medical): No  Physical Activity: Sufficiently Active (08/13/2022)   Exercise Vital Sign    Days of Exercise per Week: 5 days    Minutes of Exercise per Session: 30 min  Stress: No Stress Concern Present (08/13/2022)   [place]    Feeling of Stress : Not at all  Social Connections: Moderately Integrated (08/13/2022)    Social Connection and Isolation Panel [NHANES]    Frequency of Communication with Friends and Family: Three times a week    Frequency of Social Gatherings with Friends and Family: Three times a week    Attends Religious Services: More than 4 times per year    Active Member of Clubs or Organizations: Yes    Attends Archivist Meetings: More than 4 times per year    Marital Status: Widowed    Tobacco Counseling Counseling given: Not Answered   Clinical Intake:  Pre-visit preparation completed: Yes  Pain : No/denies pain  Diabetes: No  How  often do you need to have someone help you when you read instructions, pamphlets, or other written materials from your doctor or pharmacy?: 1 - Never  Diabetic?No   Interpreter Needed?: No  Information entered by :: Denman George LPN   Activities of Daily Living    08/13/2022    2:53 PM  In your present state of health, do you have any difficulty performing the following activities:  Hearing? 1  Comment without hearing aids  Vision? 0  Difficulty concentrating or making decisions? 0  Walking or climbing stairs? 0  Dressing or bathing? 0  Doing errands, shopping? 0  Preparing Food and eating ? N  Using the Toilet? N  In the past six months, have you accidently leaked urine? N  Do you have problems with loss of bowel control? N  Managing your Medications? N  Managing your Finances? N  Housekeeping or managing your Housekeeping? N    Patient Care Team: Dettinger, Fransisca Kaufmann, MD as PCP - General (Family Medicine) Dahlia Byes, MD as Attending Physician (Cardiothoracic Surgery) de Stanford Scotland, MD (Inactive) (Cardiology) Minus Breeding, MD as Consulting Physician (Cardiology)  Indicate any recent Medical Services you may have received from other than [facility] providers in the past year (date may be approximate).     Assessment:   This is a routine wellness examination for Scott.  Hearing/Vision screen Hearing  Screening - Comments:: Wears bilateral hearing aids  Vision Screening - Comments:: Wears rx glasses - up to date with routine eye exams with [place] issues and exercise activities discussed: Current Exercise Habits: Home exercise routine, Type of exercise: walking, Time (Minutes): 30, Frequency (Times/Week): 5, Weekly Exercise (Minutes/Week): 150, Intensity: Mild   Goals Addressed   None   Depression Screen    08/13/2022    2:56 PM 04/09/2022   10:20 AM 01/16/2022    2:17 PM 09/15/2021    9:26 AM 07/05/2021    2:14 PM 06/28/2021    1:47 PM 03/15/2021    8:43 AM  PHQ 2/9 Scores  PHQ - 2 Score 0  0 0 0 0 0  PHQ- 9 Score   0 0     Exception Documentation  Patient refusal         Fall Risk    08/13/2022    2:53 PM 04/09/2022   10:20 AM 01/16/2022    2:17 PM 10/09/2021    1:45 PM 09/15/2021    9:26 AM  [place] in the past year? 0 0 0 0 0  Number falls in past yr: 0      Injury with Fall? 0      Risk for fall due to : No Fall Risks      Follow up Falls evaluation completed;Education provided;Falls prevention discussed        FALL RISK PREVENTION PERTAINING TO THE HOME:  Any stairs in or around the home? Yes  If so, are there any without handrails? No  Home free of loose throw rugs in walkways, pet beds, electrical cords, etc? Yes  Adequate lighting in your home to reduce risk of falls? Yes   ASSISTIVE DEVICES UTILIZED TO PREVENT FALLS:  Life alert? No  Use of a cane, walker or w/c? No  Grab bars in the bathroom? Yes  Shower chair or bench in shower? No  Elevated toilet seat or a handicapped toilet? Yes   TIMED UP AND GO:  Was the test performed? No .  Telephonic visit   Cognitive Function:        08/13/2022    2:55 PM 03/14/2021    4:07 PM 01/15/2019    2:42 PM  6CIT Screen  What Year? 0 points 0 points 0 points  What month? 0 points 0 points 0 points  What time? 0 points 0 points 0 points  Count back from 20 0 points 0 points 0 points   Months in reverse 0 points 0 points 0 points  Repeat phrase 2 points 0 points 2 points  Total Score 2 points 0 points 2 points    Immunizations Immunization History  Administered Date(s) Administered   COVID-19, mRNA, vaccine(Comirnaty)12 years and older 06/18/2022   Fluad Quad(high Dose 65+) 05/09/2019   Influenza, High Dose Seasonal PF 05/24/2016, 05/24/2017, 05/18/2020   Influenza,inj,Quad PF,6+ Mos 06/11/2018   Influenza-Unspecified 05/15/2014, 05/22/2021   Moderna Covid-19 Vaccine Bivalent Booster 69yr & up 08/08/2021   Moderna Sars-Covid-2 Vaccination 10/28/2019, 11/25/2019, 07/19/2020, 02/14/2021   Td 08/27/2012    TDAP status: Up to date  Flu Vaccine status: Declined, Education has been provided regarding the importance of this vaccine but patient still declined. Advised may receive this vaccine at local pharmacy or Health Dept. Aware to provide a copy of the vaccination record if obtained from local pharmacy or Health Dept. Verbalized acceptance and understanding.  Pneumococcal vaccine status: Up to date  Covid-19 vaccine status: Information provided on how to obtain vaccines.   Qualifies for Shingles Vaccine? Yes   Zostavax completed No   Shingrix Completed?: No.    Education has been provided regarding the importance of this vaccine. Patient has been advised to call insurance company to determine out of pocket expense if they have not yet received this vaccine. Advised may also receive vaccine at local pharmacy or Health Dept. Verbalized acceptance and understanding.  Screening Tests Health Maintenance  Topic Date Due   Zoster Vaccines- Shingrix (1 of 2) Never done   INFLUENZA VACCINE  03/27/2022   COVID-19 Vaccine (7 - 2023-24 season) 08/13/2022   Pneumonia Vaccine 88 Years old (1 - PCV) 04/09/2023 (Originally [birth year]1999)   DTaP/Tdap/Td (2 - Tdap) 08/27/2022   Medicare Annual Wellness (AWV)  08/14/2023   HPV VACCINES  Aged Out    Health Maintenance  Health  Maintenance Due  Topic Date Due   Zoster Vaccines- Shingrix (1 of 2) Never done   INFLUENZA VACCINE  03/27/2022   COVID-19 Vaccine (7 - 2023-24 season) 08/13/2022    Colorectal cancer screening: No longer required.   Lung Cancer Screening: (Low Dose CT Chest recommended if Age 86-80years, 30 pack-year currently smoking OR have quit w/in 15years.) does not qualify.   Lung Cancer Screening Referral: n/a  Additional Screening:  Hepatitis C Screening: does not qualify  Vision Screening: Recommended annual ophthalmology exams for early detection of glaucoma and other disorders of the eye. Is the patient up to date with their annual eye exam?  Yes  Who is the provider or what is the name of the office in which the patient attends annual eye exams? [facility] If pt is not established with a provider, would they like to be referred to a provider to establish care? No .   Dental Screening: Recommended annual dental exams for proper oral hygiene  Community Resource Referral / Chronic Care Management: CRR required this visit?  No   CCM required this visit?  No      Plan:     I  have personally reviewed and noted the following in the patient's chart:   Medical and social history Use of alcohol, tobacco or illicit drugs  Current medications and supplements including opioid prescriptions. Patient is not currently taking opioid prescriptions. Functional ability and status Nutritional status Physical activity Advanced directives List of other physicians Hospitalizations, surgeries, and ER visits in previous 12 months Vitals Screenings to include cognitive, depression, and falls Referrals and appointments  In addition, I have reviewed and discussed with patient certain preventive protocols, quality metrics, and best practice recommendations. A written personalized care plan for preventive services as well as general preventive health recommendations were provided  to patient.     Vanetta Mulders, [place]   [id number]   Due to this being a virtual visit, the after visit summary with patients personalized plan was offered to patient via mail or my-chart.  Patient would like to access on my-chart  Nurse Notes: No concerns

## 2022-09-23 ENCOUNTER — Other Ambulatory Visit: Payer: Self-pay | Admitting: Family Medicine

## 2022-10-06 ENCOUNTER — Other Ambulatory Visit: Payer: Self-pay | Admitting: Family Medicine

## 2022-10-06 DIAGNOSIS — I10 Essential (primary) hypertension: Secondary | ICD-10-CM

## 2022-10-10 ENCOUNTER — Telehealth: Payer: Self-pay | Admitting: Family Medicine

## 2022-10-10 ENCOUNTER — Ambulatory Visit (INDEPENDENT_AMBULATORY_CARE_PROVIDER_SITE_OTHER): Payer: PPO | Admitting: Family Medicine

## 2022-10-10 ENCOUNTER — Encounter: Payer: Self-pay | Admitting: Family Medicine

## 2022-10-10 VITALS — BP 136/79 | HR 89 | Ht 71.0 in | Wt 245.0 lb

## 2022-10-10 DIAGNOSIS — I2510 Atherosclerotic heart disease of native coronary artery without angina pectoris: Secondary | ICD-10-CM

## 2022-10-10 DIAGNOSIS — E7800 Pure hypercholesterolemia, unspecified: Secondary | ICD-10-CM | POA: Diagnosis not present

## 2022-10-10 DIAGNOSIS — I10 Essential (primary) hypertension: Secondary | ICD-10-CM | POA: Diagnosis not present

## 2022-10-10 DIAGNOSIS — I480 Paroxysmal atrial fibrillation: Secondary | ICD-10-CM

## 2022-10-10 DIAGNOSIS — E039 Hypothyroidism, unspecified: Secondary | ICD-10-CM

## 2022-10-10 DIAGNOSIS — E785 Hyperlipidemia, unspecified: Secondary | ICD-10-CM | POA: Diagnosis not present

## 2022-10-10 MED ORDER — METOPROLOL TARTRATE 25 MG PO TABS: 25.0000 mg | ORAL_TABLET | Freq: Two times a day (BID) | ORAL | 3 refills | Status: DC

## 2022-10-10 MED ORDER — DOXAZOSIN MESYLATE 4 MG PO TABS: 2.0000 mg | ORAL_TABLET | Freq: Every day | ORAL | 3 refills | Status: DC

## 2022-10-10 MED ORDER — POTASSIUM CHLORIDE CRYS ER 20 MEQ PO TBCR: 20.0000 meq | EXTENDED_RELEASE_TABLET | Freq: Two times a day (BID) | ORAL | 3 refills | Status: DC

## 2022-10-10 MED ORDER — LEVOTHYROXINE SODIUM 50 MCG PO TABS: 50.0000 ug | ORAL_TABLET | Freq: Every day | ORAL | 3 refills | Status: DC

## 2022-10-10 MED ORDER — AMLODIPINE BESYLATE 10 MG PO TABS: 10.0000 mg | ORAL_TABLET | Freq: Every day | ORAL | 3 refills | Status: DC

## 2022-10-10 MED ORDER — ALLOPURINOL 300 MG PO TABS: 300.0000 mg | ORAL_TABLET | Freq: Every day | ORAL | 3 refills | Status: DC

## 2022-10-10 MED ORDER — RIVAROXABAN 20 MG PO TABS: 20.0000 mg | ORAL_TABLET | Freq: Every day | ORAL | 3 refills | Status: DC

## 2022-10-10 MED ORDER — FUROSEMIDE 20 MG PO TABS: 20.0000 mg | ORAL_TABLET | Freq: Every day | ORAL | 1 refills | Status: DC

## 2022-10-10 MED ORDER — ATORVASTATIN CALCIUM 80 MG PO TABS: 80.0000 mg | ORAL_TABLET | Freq: Every day | ORAL | 3 refills | Status: DC

## 2022-10-10 NOTE — Progress Notes (Signed)
BP 136/79   Pulse 89   Ht 5' 11"$  (1.803 m)   Wt 245 lb (111.1 kg)   SpO2 97%   BMI 34.17 kg/m    Subjective:   Patient ID: Marcus [name], male    DOB: 05/02/1933, 89 y.o.   MRN: [id number]  HPI: Marcus [name] is a 89 y.o. male presenting on 10/10/2022 for Medical Management of Chronic Issues, Hyperlipidemia, Hypertension, and Hypothyroidism   HPI Hypertension Patient is currently on amlodipine and furosemide and doxazosin and metoprolol, and their blood pressure today is 136/79. Patient denies any lightheadedness or dizziness. Patient denies headaches, blurred vision, chest pains, shortness of breath, or weakness. Denies any side effects from medication and is content with current medication.   Hyperlipidemia Patient is coming in for recheck of his hyperlipidemia. The patient is currently taking atorvastatin. They deny any issues with myalgias or history of liver damage from it. They deny any focal numbness or weakness or chest pain.   Hypothyroidism recheck Patient is coming in for thyroid recheck today as well. They deny any issues with hair changes or heat or cold problems or diarrhea or constipation. They deny any chest pain or palpitations. They are currently on levothyroxine 50 micrograms   A-fib recheck Patient is coming in for A-fib recheck.  Currently takes Xarelto and Cardura and metoprolol. Patient denies any chest pain, shortness of breath, headaches or vision issues, abdominal complaints, diarrhea, nausea, vomiting, or joint issues.   Relevant past medical, surgical, family and social history reviewed and updated as indicated. Interim medical history since our last visit reviewed. Allergies and medications reviewed and updated.  Review of Systems  Constitutional:  Negative for chills and fever.  HENT:  Negative for ear pain and tinnitus.   Eyes:  Negative for pain and discharge.  Respiratory:  Negative for cough, shortness of breath and wheezing.   Cardiovascular:   Negative for chest pain, palpitations and leg swelling.  Gastrointestinal:  Negative for abdominal pain, blood in stool, constipation and diarrhea.  Genitourinary:  Negative for dysuria and hematuria.  Musculoskeletal:  Negative for back pain, gait problem and myalgias.  Skin:  Negative for rash.  Neurological:  Negative for dizziness, weakness and headaches.  Psychiatric/Behavioral:  Negative for suicidal ideas.   All other systems reviewed and are negative.   Per HPI unless specifically indicated above   Allergies as of 10/10/2022   No Known Allergies      Medication List        Accurate as of October 10, 2022 10:18 AM. If you have any questions, ask your nurse or doctor.          allopurinol 300 MG tablet Commonly known as: ZYLOPRIM Take 1 tablet (300 mg total) by mouth daily.   amLODipine 10 MG tablet Commonly known as: NORVASC Take 1 tablet (10 mg total) by mouth daily.   atorvastatin 80 MG tablet Commonly known as: LIPITOR Take 1 tablet (80 mg total) by mouth daily.   doxazosin 4 MG tablet Commonly known as: CARDURA Take 0.5 tablets (2 mg total) by mouth daily. Take 1/2 tablet by mouth daily   ferrous gluconate 324 MG tablet Commonly known as: FERGON Take 324 mg by mouth 2 (two) times daily with a meal.   furosemide 20 MG tablet Commonly known as: LASIX Take 1 tablet (20 mg total) by mouth daily.   levothyroxine 50 MCG tablet Commonly known as: SYNTHROID Take 1 tablet (50 mcg total) by mouth daily.  metoprolol tartrate 25 MG tablet Commonly known as: LOPRESSOR Take 1 tablet (25 mg total) by mouth 2 (two) times daily.   potassium chloride SA 20 MEQ tablet Commonly known as: Klor-Con M20 Take 1 tablet (20 mEq total) by mouth 2 (two) times daily. What changed: how much to take Changed by: Worthy Rancher, MD   rivaroxaban 20 MG Tabs tablet Commonly known as: Xarelto Take 1 tablet (20 mg total) by mouth daily with supper.          Objective:   BP 136/79   Pulse 89   Ht 5' 11"$  (1.803 m)   Wt 245 lb (111.1 kg)   SpO2 97%   BMI 34.17 kg/m   Wt Readings from Last 3 Encounters:  10/10/22 245 lb (111.1 kg)  08/13/22 240 lb (108.9 kg)  04/09/22 243 lb (110.2 kg)    Physical Exam Vitals and nursing note reviewed.  Constitutional:      General: He is not in acute distress.    Appearance: He is well-developed. He is not diaphoretic.  Eyes:     General: No scleral icterus.    Conjunctiva/sclera: Conjunctivae normal.  Neck:     Thyroid: No thyromegaly.  Cardiovascular:     Rate and Rhythm: Normal rate. Rhythm irregular.     Heart sounds: Normal heart sounds. No murmur heard. Pulmonary:     Effort: Pulmonary effort is normal. No respiratory distress.     Breath sounds: Normal breath sounds. No wheezing.  Musculoskeletal:        General: Swelling (2+ bilateral lower extremity pitting edema) present. Normal range of motion.     Cervical back: Neck supple.  Lymphadenopathy:     Cervical: No cervical adenopathy.  Skin:    General: Skin is warm and dry.     Findings: No rash.  Neurological:     Mental Status: He is alert and oriented to person, place, and time.     Coordination: Coordination normal.  Psychiatric:        Behavior: Behavior normal.       Assessment & Plan:   Problem List Items Addressed This Visit       Cardiovascular and Mediastinum   HTN (hypertension)   Relevant Medications   amLODipine (NORVASC) 10 MG tablet   atorvastatin (LIPITOR) 80 MG tablet   doxazosin (CARDURA) 4 MG tablet   furosemide (LASIX) 20 MG tablet   metoprolol tartrate (LOPRESSOR) 25 MG tablet   rivaroxaban (XARELTO) 20 MG TABS tablet   Other Relevant Orders   CMP14+EGFR   Paroxysmal atrial fibrillation (HCC)   Relevant Medications   amLODipine (NORVASC) 10 MG tablet   atorvastatin (LIPITOR) 80 MG tablet   doxazosin (CARDURA) 4 MG tablet   furosemide (LASIX) 20 MG tablet   metoprolol tartrate  (LOPRESSOR) 25 MG tablet   rivaroxaban (XARELTO) 20 MG TABS tablet   Other Relevant Orders   CBC with Differential/Platelet   CORONARY ATHEROSCLEROSIS NATIVE CORONARY ARTERY - Primary   Relevant Medications   amLODipine (NORVASC) 10 MG tablet   atorvastatin (LIPITOR) 80 MG tablet   doxazosin (CARDURA) 4 MG tablet   furosemide (LASIX) 20 MG tablet   metoprolol tartrate (LOPRESSOR) 25 MG tablet   rivaroxaban (XARELTO) 20 MG TABS tablet     Other   Pure hypercholesterolemia   Relevant Medications   amLODipine (NORVASC) 10 MG tablet   atorvastatin (LIPITOR) 80 MG tablet   doxazosin (CARDURA) 4 MG tablet   furosemide (LASIX) 20 MG tablet  metoprolol tartrate (LOPRESSOR) 25 MG tablet   rivaroxaban (XARELTO) 20 MG TABS tablet   Other Relevant Orders   Lipid panel   Dyslipidemia   Relevant Medications   atorvastatin (LIPITOR) 80 MG tablet   Other Relevant Orders   Lipid panel   Other Visit Diagnoses     Acquired hypothyroidism       Relevant Medications   levothyroxine (SYNTHROID) 50 MCG tablet   metoprolol tartrate (LOPRESSOR) 25 MG tablet   Other Relevant Orders   TSH     Continue current medicine, no changes, will get blood work today.  Follow up plan: Return in about 6 months (around 04/10/2023), or if symptoms worsen or fail to improve, for Hypothyroidism and cholesterol recheck.  Counseling provided for all of the vaccine components Orders Placed This Encounter  Procedures   CBC with Differential/Platelet   CMP14+EGFR   Lipid panel   TSH    Caryl Pina, MD [facility] 10/10/2022, 10:18 AM

## 2022-10-11 LAB — CBC WITH DIFFERENTIAL/PLATELET
Basophils Absolute: 0 10*3/uL (ref 0.0–0.2)
Basos: 1 %
EOS (ABSOLUTE): 0.2 10*3/uL (ref 0.0–0.4)
Eos: 4 %
Hematocrit: 38.6 % (ref 37.5–51.0)
Hemoglobin: 13.2 g/dL (ref 13.0–17.7)
Immature Grans (Abs): 0 10*3/uL (ref 0.0–0.1)
Immature Granulocytes: 0 %
Lymphocytes Absolute: 1.6 10*3/uL (ref 0.7–3.1)
Lymphs: 31 %
MCH: 30.9 pg (ref 26.6–33.0)
MCHC: 34.2 g/dL (ref 31.5–35.7)
MCV: 90 fL (ref 79–97)
Monocytes Absolute: 0.6 10*3/uL (ref 0.1–0.9)
Monocytes: 11 %
Neutrophils Absolute: 2.7 10*3/uL (ref 1.4–7.0)
Neutrophils: 53 %
Platelets: 159 10*3/uL (ref 150–450)
RBC: 4.27 x10E6/uL (ref 4.14–5.80)
RDW: 13 % (ref 11.6–15.4)
WBC: 5.2 10*3/uL (ref 3.4–10.8)

## 2022-10-11 LAB — CMP14+EGFR
ALT: 7 IU/L (ref 0–44)
AST: 14 IU/L (ref 0–40)
Albumin/Globulin Ratio: 1.5 (ref 1.2–2.2)
Albumin: 3.9 g/dL (ref 3.7–4.7)
Alkaline Phosphatase: 110 IU/L (ref 44–121)
BUN/Creatinine Ratio: 9 — ABNORMAL LOW (ref 10–24)
BUN: 9 mg/dL (ref 8–27)
Bilirubin Total: 0.8 mg/dL (ref 0.0–1.2)
CO2: 25 mmol/L (ref 20–29)
Calcium: 9.2 mg/dL (ref 8.6–10.2)
Chloride: 99 mmol/L (ref 96–106)
Creatinine, Ser: 0.98 mg/dL (ref 0.76–1.27)
Globulin, Total: 2.6 g/dL (ref 1.5–4.5)
Glucose: 88 mg/dL (ref 70–99)
Potassium: 4.1 mmol/L (ref 3.5–5.2)
Sodium: 140 mmol/L (ref 134–144)
Total Protein: 6.5 g/dL (ref 6.0–8.5)
eGFR: 74 mL/min/{1.73_m2} (ref >59)

## 2022-10-11 LAB — LIPID PANEL
Chol/HDL Ratio: 2.9 ratio (ref 0.0–5.0)
Cholesterol, Total: 119 mg/dL (ref 100–199)
HDL: 41 mg/dL (ref >39)
LDL Chol Calc (NIH): 54 mg/dL (ref 0–99)
Triglycerides: 140 mg/dL (ref 0–149)
VLDL Cholesterol Cal: 24 mg/dL (ref 5–40)

## 2022-10-11 LAB — TSH: TSH: 2.36 u[IU]/mL (ref 0.450–4.500)

## 2023-07-14 NOTE — Progress Notes (Unsigned)
  Cardiology Office Note:   Date:  07/17/2023  ID:  Marcus [name], DOB 04-03-1933, MRN [id number] PCP: Dettinger, Elige Radon, MD  [facility] Providers Cardiologist:  None {  History of Present Illness:   Marcus [name] is a 89 y.o. male who presents for followup of coronary disease status post CABG and atrial flutter. He has been cardioverted.   However, he went back in atrial fibrillation.  He does not notice his fibrillation.  He does not have any palpitations, presyncope or syncope.  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  He is bothered by bilateral shoulder pain that he has not had checked out yet.  He still able to operate the machines.  Sounds like he does not do a lot of physical activity outside of getting in and out of the trucks.  He is not having any new shortness of breath, PND or orthopnea.  He is not having any new chest pressure, neck or arm discomfort.  He has had no weight gain.  He has some mild chronic edema of the is better when he wakes up in the morning.   ROS: As stated in the HPI and negative for all other systems.  Studies Reviewed:    EKG:   EKG Interpretation Date/Time:  Wednesday July 17 2023 11:42:43 EST Ventricular Rate:  92 PR Interval:    QRS Duration:  90 QT Interval:  324 QTC Calculation: 400 R Axis:   -46  Text Interpretation: Atrial fibrillation Left axis deviation Poor anteiror R wave progression When compared with ECG of 22-Jul-2018 06:23, No significant change since last tracing Confirmed by Rollene Rotunda ([id number]) on 07/17/2023 12:00:05 PM     Risk Assessment/Calculations:              Physical Exam:   VS:  BP 120/70   Pulse 92   Ht 5\' 11"  (1.803 m)   Wt 245 lb (111.1 kg)   BMI 34.17 kg/m    Wt Readings from Last 3 Encounters:  07/17/23 245 lb (111.1 kg)  10/10/22 245 lb (111.1 kg)  08/13/22 240 lb (108.9 kg)     GEN: Well nourished, well developed in no acute distress NECK: No JVD; No carotid bruits CARDIAC:  Irregular RR, no murmurs, rubs, gallops RESPIRATORY:  Clear to auscultation without rales, wheezing or rhonchi  ABDOMEN: Soft, non-tender, non-distended EXTREMITIES:  No edema; No deformity   ASSESSMENT AND PLAN:   CAD:   The patient has no new sypmtoms.  No further cardiovascular testing is indicated.  We will continue with aggressive risk reduction and meds as listed.     ATRIAL FIB:    Marcus [name] has a CHA2DS2 - VASc score of 4 .    He tolerates anticoagulation and has good rate control.  No change in therapy.  I will send him for a CBC.    HTN:  The blood pressure is at target.  No change in therapy.   DYSLIPIDEMIA: LDL was 54 with an HDL of 41.  No change in therapy.  DOT:     He is up-to-date with his DOT.      Follow up with me in one year.   Signed, Rollene Rotunda, MD

## 2023-07-17 ENCOUNTER — Encounter: Payer: Self-pay | Admitting: Cardiology

## 2023-07-17 ENCOUNTER — Other Ambulatory Visit: Payer: Self-pay | Admitting: *Deleted

## 2023-07-17 ENCOUNTER — Other Ambulatory Visit: Payer: PPO

## 2023-07-17 ENCOUNTER — Ambulatory Visit: Payer: PPO | Admitting: Cardiology

## 2023-07-17 VITALS — BP 120/70 | HR 92 | Ht 71.0 in | Wt 245.0 lb

## 2023-07-17 DIAGNOSIS — I10 Essential (primary) hypertension: Secondary | ICD-10-CM

## 2023-07-17 DIAGNOSIS — Z79899 Other long term (current) drug therapy: Secondary | ICD-10-CM | POA: Diagnosis not present

## 2023-07-17 DIAGNOSIS — I480 Paroxysmal atrial fibrillation: Secondary | ICD-10-CM

## 2023-07-17 DIAGNOSIS — E785 Hyperlipidemia, unspecified: Secondary | ICD-10-CM | POA: Diagnosis not present

## 2023-07-17 DIAGNOSIS — I2510 Atherosclerotic heart disease of native coronary artery without angina pectoris: Secondary | ICD-10-CM

## 2023-07-17 NOTE — Patient Instructions (Signed)
Medication Instructions:  The current medical regimen is effective;  continue present plan and medications.  *If you need a refill on your cardiac medications before your next appointment, please call your pharmacy*  Lab Work: Please have blood work at your closest [facility]  (CBC)  If you have labs (blood work) drawn today and your tests are completely normal, you will receive your results only by: MyChart Message (if you have MyChart) OR A paper copy in the mail If you have any lab test that is abnormal or we need to change your treatment, we will call you to review the results.  Follow-Up: At [facility], you and your health needs are our priority.  As part of our continuing mission to provide you with exceptional heart care, we have created designated Provider Care Teams.  These Care Teams include your primary Cardiologist (physician) and Advanced Practice Providers (APPs -  Physician Assistants and Nurse Practitioners) who all work together to provide you with the care you need, when you need it.  We recommend signing up for the patient portal called "MyChart".  Sign up information is provided on this After Visit Summary.  MyChart is used to connect with patients for Virtual Visits (Telemedicine).  Patients are able to view lab/test results, encounter notes, upcoming appointments, etc.  Non-urgent messages can be sent to your provider as well.   To learn more about what you can do with MyChart, go to [email or web address].    Your next appointment:   1 year(s)  Provider:   Rollene Rotunda, MD

## 2023-07-18 LAB — CBC
Hematocrit: 39.6 % (ref 37.5–51.0)
Hemoglobin: 13.1 g/dL (ref 13.0–17.7)
MCH: 30.5 pg (ref 26.6–33.0)
MCHC: 33.1 g/dL (ref 31.5–35.7)
MCV: 92 fL (ref 79–97)
Platelets: 151 10*3/uL (ref 150–450)
RBC: 4.3 x10E6/uL (ref 4.14–5.80)
RDW: 12.9 % (ref 11.6–15.4)
WBC: 6 10*3/uL (ref 3.4–10.8)

## 2023-07-19 ENCOUNTER — Encounter: Payer: Self-pay | Admitting: *Deleted

## 2023-08-20 ENCOUNTER — Other Ambulatory Visit: Payer: Self-pay | Admitting: Family Medicine

## 2023-08-20 DIAGNOSIS — I480 Paroxysmal atrial fibrillation: Secondary | ICD-10-CM

## 2023-08-20 DIAGNOSIS — I10 Essential (primary) hypertension: Secondary | ICD-10-CM

## 2023-09-13 ENCOUNTER — Encounter: Payer: Self-pay | Admitting: Family Medicine

## 2023-09-13 ENCOUNTER — Other Ambulatory Visit: Payer: Self-pay | Admitting: Family Medicine

## 2023-09-13 DIAGNOSIS — I10 Essential (primary) hypertension: Secondary | ICD-10-CM

## 2023-09-13 DIAGNOSIS — I480 Paroxysmal atrial fibrillation: Secondary | ICD-10-CM

## 2023-09-13 NOTE — Telephone Encounter (Signed)
Letter sent.

## 2023-09-13 NOTE — Telephone Encounter (Signed)
dettinger pt NTBS 30-d given 08/22/23

## 2023-09-17 ENCOUNTER — Other Ambulatory Visit: Payer: Self-pay | Admitting: Family Medicine

## 2023-09-17 DIAGNOSIS — I10 Essential (primary) hypertension: Secondary | ICD-10-CM

## 2023-09-17 DIAGNOSIS — I480 Paroxysmal atrial fibrillation: Secondary | ICD-10-CM

## 2023-09-17 MED ORDER — FUROSEMIDE 20 MG PO TABS: 20.0000 mg | ORAL_TABLET | Freq: Every day | ORAL | 0 refills | Status: DC

## 2023-09-17 NOTE — Addendum Note (Signed)
Addended by: Julious Payer D on: 09/17/2023 09:37 AM   Modules accepted: Orders

## 2023-09-17 NOTE — Telephone Encounter (Signed)
Apt scheduled for 09/18/2023

## 2023-09-17 NOTE — Telephone Encounter (Signed)
Dettinger pt NTBS 30-d given 08/22/23

## 2023-09-18 ENCOUNTER — Encounter: Payer: Self-pay | Admitting: Family Medicine

## 2023-09-18 ENCOUNTER — Ambulatory Visit (INDEPENDENT_AMBULATORY_CARE_PROVIDER_SITE_OTHER): Payer: PPO | Admitting: Family Medicine

## 2023-09-18 DIAGNOSIS — E7800 Pure hypercholesterolemia, unspecified: Secondary | ICD-10-CM | POA: Diagnosis not present

## 2023-09-18 DIAGNOSIS — I10 Essential (primary) hypertension: Secondary | ICD-10-CM | POA: Diagnosis not present

## 2023-09-18 DIAGNOSIS — E039 Hypothyroidism, unspecified: Secondary | ICD-10-CM

## 2023-09-18 DIAGNOSIS — I480 Paroxysmal atrial fibrillation: Secondary | ICD-10-CM | POA: Diagnosis not present

## 2023-09-18 DIAGNOSIS — E785 Hyperlipidemia, unspecified: Secondary | ICD-10-CM | POA: Diagnosis not present

## 2023-09-18 LAB — LIPID PANEL

## 2023-09-18 MED ORDER — FUROSEMIDE 20 MG PO TABS: 20.0000 mg | ORAL_TABLET | Freq: Every day | ORAL | 3 refills | Status: DC

## 2023-09-18 MED ORDER — ATORVASTATIN CALCIUM 80 MG PO TABS: 80.0000 mg | ORAL_TABLET | Freq: Every day | ORAL | 3 refills | Status: DC

## 2023-09-18 MED ORDER — METOPROLOL TARTRATE 25 MG PO TABS: 25.0000 mg | ORAL_TABLET | Freq: Two times a day (BID) | ORAL | 3 refills | Status: DC

## 2023-09-18 MED ORDER — ALLOPURINOL 300 MG PO TABS: 300.0000 mg | ORAL_TABLET | Freq: Every day | ORAL | 3 refills | Status: DC

## 2023-09-18 MED ORDER — LEVOTHYROXINE SODIUM 50 MCG PO TABS: 50.0000 ug | ORAL_TABLET | Freq: Every day | ORAL | 3 refills | Status: DC

## 2023-09-18 MED ORDER — DOXAZOSIN MESYLATE 4 MG PO TABS: 2.0000 mg | ORAL_TABLET | Freq: Every day | ORAL | 3 refills | Status: DC

## 2023-09-18 MED ORDER — AMLODIPINE BESYLATE 10 MG PO TABS: 10.0000 mg | ORAL_TABLET | Freq: Every day | ORAL | 3 refills | Status: DC

## 2023-09-18 MED ORDER — POTASSIUM CHLORIDE CRYS ER 20 MEQ PO TBCR: 20.0000 meq | EXTENDED_RELEASE_TABLET | Freq: Two times a day (BID) | ORAL | 3 refills | Status: DC

## 2023-09-18 MED ORDER — RIVAROXABAN 20 MG PO TABS: 20.0000 mg | ORAL_TABLET | Freq: Every day | ORAL | 3 refills | Status: DC

## 2023-09-18 NOTE — Progress Notes (Signed)
BP 138/81   Pulse (!) 105   Ht 5\' 11"  (1.803 m)   Wt 248 lb (112.5 kg)   SpO2 95%   BMI 34.59 kg/m    Subjective:   Patient ID: Marcus [name], male    DOB: 1933-04-06, 89 y.o.   MRN: [id number]  HPI: Marcus [name] is a 89 y.o. male presenting on 09/18/2023 for Medical Management of Chronic Issues, Hyperlipidemia, and Hypertension   HPI Hypertension and A-fib recheck Patient is currently on amlodipine and doxazosin and furosemide and metoprolol and Xarelto, and their blood pressure today is 138/81. Patient denies any lightheadedness or dizziness. Patient denies headaches, blurred vision, chest pains, shortness of breath, or weakness. Denies any side effects from medication and is content with current medication.   Hyperlipidemia status post bypass and CAD Patient is coming in for recheck of his hyperlipidemia. The patient is currently taking atorvastatin. They deny any issues with myalgias or history of liver damage from it. They deny any focal numbness or weakness or chest pain.   Hypothyroidism recheck Patient is coming in for thyroid recheck today as well. They deny any issues with hair changes or heat or cold problems or diarrhea or constipation. They deny any chest pain or palpitations. They are currently on levothyroxine 50 micrograms   Relevant past medical, surgical, family and social history reviewed and updated as indicated. Interim medical history since our last visit reviewed. Allergies and medications reviewed and updated.  Review of Systems  Constitutional:  Negative for chills and fever.  Eyes:  Negative for discharge and visual disturbance.  Respiratory:  Negative for shortness of breath and wheezing.   Cardiovascular:  Negative for chest pain and leg swelling.  Musculoskeletal:  Negative for back pain and gait problem.  Skin:  Negative for rash.  Neurological:  Negative for dizziness and light-headedness.  All other systems reviewed and are negative.   Per HPI  unless specifically indicated above   Allergies as of 09/18/2023   No Known Allergies      Medication List        Accurate as of September 18, 2023 10:34 AM. If you have any questions, ask your nurse or doctor.          allopurinol 300 MG tablet Commonly known as: ZYLOPRIM Take 1 tablet (300 mg total) by mouth daily.   amLODipine 10 MG tablet Commonly known as: NORVASC Take 1 tablet (10 mg total) by mouth daily.   atorvastatin 80 MG tablet Commonly known as: LIPITOR Take 1 tablet (80 mg total) by mouth daily.   doxazosin 4 MG tablet Commonly known as: CARDURA Take 0.5 tablets (2 mg total) by mouth daily. Take 1/2 tablet by mouth daily   ferrous gluconate 324 MG tablet Commonly known as: FERGON Take 324 mg by mouth 2 (two) times daily with a meal.   furosemide 20 MG tablet Commonly known as: LASIX Take 1 tablet (20 mg total) by mouth daily.   levothyroxine 50 MCG tablet Commonly known as: SYNTHROID Take 1 tablet (50 mcg total) by mouth daily.   metoprolol tartrate 25 MG tablet Commonly known as: LOPRESSOR Take 1 tablet (25 mg total) by mouth 2 (two) times daily.   potassium chloride SA 20 MEQ tablet Commonly known as: Klor-Con M20 Take 1 tablet (20 mEq total) by mouth 2 (two) times daily.   rivaroxaban 20 MG Tabs tablet Commonly known as: Xarelto Take 1 tablet (20 mg total) by mouth daily with supper.  Objective:   BP 138/81   Pulse (!) 105   Ht 5\' 11"  (1.803 m)   Wt 248 lb (112.5 kg)   SpO2 95%   BMI 34.59 kg/m   Wt Readings from Last 3 Encounters:  09/18/23 248 lb (112.5 kg)  07/17/23 245 lb (111.1 kg)  10/10/22 245 lb (111.1 kg)    Physical Exam Vitals and nursing note reviewed.  Constitutional:      General: He is not in acute distress.    Appearance: He is well-developed. He is not diaphoretic.  Eyes:     General: No scleral icterus.    Conjunctiva/sclera: Conjunctivae normal.  Neck:     Thyroid: No thyromegaly.   Cardiovascular:     Rate and Rhythm: Normal rate. Rhythm irregular.     Heart sounds: Murmur (Grade 2 out of 6) heard.  Pulmonary:     Effort: Pulmonary effort is normal. No respiratory distress.     Breath sounds: Normal breath sounds. No wheezing.  Musculoskeletal:        General: Swelling (1+ edema) present. Normal range of motion.     Cervical back: Neck supple.  Lymphadenopathy:     Cervical: No cervical adenopathy.  Skin:    General: Skin is warm and dry.     Findings: No rash.  Neurological:     Mental Status: He is alert and oriented to person, place, and time.     Coordination: Coordination normal.  Psychiatric:        Behavior: Behavior normal.       Assessment & Plan:   Problem List Items Addressed This Visit       Cardiovascular and Mediastinum   HTN (hypertension)   Relevant Medications   amLODipine (NORVASC) 10 MG tablet   atorvastatin (LIPITOR) 80 MG tablet   doxazosin (CARDURA) 4 MG tablet   furosemide (LASIX) 20 MG tablet   metoprolol tartrate (LOPRESSOR) 25 MG tablet   rivaroxaban (XARELTO) 20 MG TABS tablet   Other Relevant Orders   CBC with Differential/Platelet   Paroxysmal atrial fibrillation (HCC)   Relevant Medications   amLODipine (NORVASC) 10 MG tablet   atorvastatin (LIPITOR) 80 MG tablet   doxazosin (CARDURA) 4 MG tablet   furosemide (LASIX) 20 MG tablet   metoprolol tartrate (LOPRESSOR) 25 MG tablet   rivaroxaban (XARELTO) 20 MG TABS tablet   Other Relevant Orders   CBC with Differential/Platelet     Other   Pure hypercholesterolemia   Relevant Medications   amLODipine (NORVASC) 10 MG tablet   atorvastatin (LIPITOR) 80 MG tablet   doxazosin (CARDURA) 4 MG tablet   furosemide (LASIX) 20 MG tablet   metoprolol tartrate (LOPRESSOR) 25 MG tablet   rivaroxaban (XARELTO) 20 MG TABS tablet   Other Relevant Orders   CMP14+EGFR   Lipid panel   Dyslipidemia   Relevant Medications   atorvastatin (LIPITOR) 80 MG tablet   Other  Relevant Orders   Lipid panel   Other Visit Diagnoses       Acquired hypothyroidism       Relevant Medications   levothyroxine (SYNTHROID) 50 MCG tablet   metoprolol tartrate (LOPRESSOR) 25 MG tablet   Other Relevant Orders   CBC with Differential/Platelet   TSH       Will check blood work on the way out, blood pressure and everything else looks good. Slightly tachycardic with his A-fib but asymptomatic.  No changes Follow up plan: Return in about 6 months (around 03/17/2024), or if symptoms  worsen or fail to improve, for Physical and hypertension and cholesterol recheck.  Counseling provided for all of the vaccine components Orders Placed This Encounter  Procedures   CBC with Differential/Platelet   CMP14+EGFR   Lipid panel   TSH    Arville Care, MD [facility] 09/18/2023, 10:34 AM

## 2023-09-19 LAB — CMP14+EGFR
ALT: 9 IU/L (ref 0–44)
AST: 15 IU/L (ref 0–40)
Albumin: 4.1 g/dL (ref 3.7–4.7)
Alkaline Phosphatase: 107 IU/L (ref 44–121)
BUN/Creatinine Ratio: 10 (ref 10–24)
BUN: 10 mg/dL (ref 8–27)
Bilirubin Total: 0.8 mg/dL (ref 0.0–1.2)
CO2: 27 mmol/L (ref 20–29)
Calcium: 9.2 mg/dL (ref 8.6–10.2)
Chloride: 100 mmol/L (ref 96–106)
Creatinine, Ser: 1.04 mg/dL (ref 0.76–1.27)
Globulin, Total: 2.5 g/dL (ref 1.5–4.5)
Glucose: 86 mg/dL (ref 70–99)
Potassium: 3.9 mmol/L (ref 3.5–5.2)
Sodium: 141 mmol/L (ref 134–144)
Total Protein: 6.6 g/dL (ref 6.0–8.5)
eGFR: 69 mL/min/{1.73_m2} (ref >59)

## 2023-09-19 LAB — LIPID PANEL
Cholesterol, Total: 131 mg/dL (ref 100–199)
HDL: 48 mg/dL (ref >39)
LDL CALC COMMENT:: 2.7 ratio (ref 0.0–5.0)
LDL Chol Calc (NIH): 65 mg/dL (ref 0–99)
Triglycerides: 99 mg/dL (ref 0–149)
VLDL Cholesterol Cal: 18 mg/dL (ref 5–40)

## 2023-09-19 LAB — CBC WITH DIFFERENTIAL/PLATELET
Basophils Absolute: 0.1 10*3/uL (ref 0.0–0.2)
Basos: 1 %
EOS (ABSOLUTE): 0.2 10*3/uL (ref 0.0–0.4)
Eos: 4 %
Hematocrit: 40.4 % (ref 37.5–51.0)
Hemoglobin: 13.4 g/dL (ref 13.0–17.7)
Immature Grans (Abs): 0 10*3/uL (ref 0.0–0.1)
Immature Granulocytes: 0 %
Lymphocytes Absolute: 1.5 10*3/uL (ref 0.7–3.1)
Lymphs: 28 %
MCH: 30.5 pg (ref 26.6–33.0)
MCHC: 33.2 g/dL (ref 31.5–35.7)
MCV: 92 fL (ref 79–97)
Monocytes Absolute: 0.5 10*3/uL (ref 0.1–0.9)
Monocytes: 9 %
Neutrophils Absolute: 3.1 10*3/uL (ref 1.4–7.0)
Neutrophils: 58 %
Platelets: 169 10*3/uL (ref 150–450)
RBC: 4.4 x10E6/uL (ref 4.14–5.80)
RDW: 12.5 % (ref 11.6–15.4)
WBC: 5.3 10*3/uL (ref 3.4–10.8)

## 2023-09-19 LAB — TSH: TSH: 2.58 u[IU]/mL (ref 0.450–4.500)

## 2023-09-20 ENCOUNTER — Encounter: Payer: Self-pay | Admitting: Family Medicine

## 2024-01-07 ENCOUNTER — Encounter: Payer: Self-pay | Admitting: Family Medicine

## 2024-01-07 ENCOUNTER — Ambulatory Visit: Admitting: Family Medicine

## 2024-01-07 ENCOUNTER — Ambulatory Visit (INDEPENDENT_AMBULATORY_CARE_PROVIDER_SITE_OTHER)

## 2024-01-07 VITALS — BP 132/81 | HR 109 | Temp 98.2°F | Ht 71.0 in | Wt 241.0 lb

## 2024-01-07 DIAGNOSIS — G8929 Other chronic pain: Secondary | ICD-10-CM

## 2024-01-07 DIAGNOSIS — M19011 Primary osteoarthritis, right shoulder: Secondary | ICD-10-CM | POA: Diagnosis not present

## 2024-01-07 DIAGNOSIS — M25511 Pain in right shoulder: Secondary | ICD-10-CM

## 2024-01-07 DIAGNOSIS — M19012 Primary osteoarthritis, left shoulder: Secondary | ICD-10-CM | POA: Diagnosis not present

## 2024-01-07 DIAGNOSIS — M25512 Pain in left shoulder: Secondary | ICD-10-CM

## 2024-01-07 MED ORDER — CELECOXIB 100 MG PO CAPS
200.0000 mg | ORAL_CAPSULE | Freq: Every day | ORAL | 0 refills | Status: DC
Start: 2024-01-07 — End: 2024-02-18

## 2024-01-07 MED ORDER — BETAMETHASONE SOD PHOS & ACET 6 (3-3) MG/ML IJ SUSP
6.0000 mg | Freq: Once | INTRAMUSCULAR | Status: AC
  Administered 2024-01-07: 6 mg via INTRAMUSCULAR

## 2024-01-07 NOTE — Progress Notes (Signed)
 Subjective:  Patient ID: Marcus [name], male    DOB: 1933/07/01  Age: 89 y.o. MRN: [id number]  CC: Shoulder Pain (Both shoulders are bothersome. Getting worse. Started 2 years ago. Affecting range of motion. Hurts to lift and grip. Hurts at night so not sleeping well. )   HPI Marcus [name] presents for 2 years of bilateral shoulder pain.  Is getting worse.  He has lost a lot of movement.  Specifically abduction is painful after about 20 degrees.  Rotation very limited bilaterally but greater on the left than the right for both internal and external.  He says that when he lays on 1 side he will lay there for a while until he starts having pain.  Then in a rollover to the other side after while that side will start hurting and he will have to move again.  He has no known injury.  Pain is a dull ache.  It is felt in the ball of the shoulder bilaterally.     01/07/2024    8:12 AM 09/18/2023   10:23 AM 10/10/2022    9:49 AM  Depression screen PHQ 2/9  Decreased Interest 0 0 0  Down, Depressed, Hopeless 0 0 0  PHQ - 2 Score 0 0 0  Altered sleeping 0  0  Tired, decreased energy 0  0  Change in appetite 0  0  Feeling bad or failure about yourself  0  0  Trouble concentrating 0  0  Moving slowly or fidgety/restless 0  0  Suicidal thoughts 0  0  PHQ-9 Score 0  0  Difficult doing work/chores Not difficult at all  Not difficult at all    History Kaelub has a past medical history of Angina, Arthritis, Atrial flutter (HCC), Chronic kidney disease, Coronary atherosclerosis of native coronary artery, Gout, Hypertension, Myocardial infarction (HCC), Other and unspecified hyperlipidemia, and Pure hypercholesterolemia.   He has a past surgical history that includes CATARACTS; Coronary artery bypass graft (11/20/2011); MAZE (11/20/2011); Chest tube insertion (11/22/2011); left and right heart catheterization with coronary angiogram (N/A, 11/16/2011); and Cardiac catheterization (N/A, 06/27/2015).   His  family history includes Colon cancer in his son.He reports that he has never smoked. He has never used smokeless tobacco. He reports that he does not currently use alcohol. He reports that he does not use drugs.    ROS Review of Systems  Constitutional:  Negative for fever.  Respiratory:  Negative for shortness of breath.   Cardiovascular:  Negative for chest pain.  Musculoskeletal:  Positive for arthralgias.  Skin:  Negative for rash.    Objective:  BP 132/81   Pulse (!) 109   Temp 98.2 F (36.8 C)   Ht 5\' 11"  (1.803 m)   Wt 241 lb (109.3 kg)   SpO2 95%   BMI 33.61 kg/m   BP Readings from Last 3 Encounters:  01/07/24 132/81  09/18/23 138/81  07/17/23 120/70    Wt Readings from Last 3 Encounters:  01/07/24 241 lb (109.3 kg)  09/18/23 248 lb (112.5 kg)  07/17/23 245 lb (111.1 kg)     Physical Exam Vitals reviewed.  Constitutional:      Appearance: He is well-developed.  HENT:     Head: Normocephalic and atraumatic.     Right Ear: External ear normal.     Left Ear: External ear normal.     Mouth/Throat:     Pharynx: No oropharyngeal exudate or posterior oropharyngeal erythema.  Eyes:  Pupils: Pupils are equal, round, and reactive to light.  Cardiovascular:     Rate and Rhythm: Normal rate and regular rhythm.     Heart sounds: No murmur heard. Pulmonary:     Effort: No respiratory distress.     Breath sounds: Normal breath sounds.  Musculoskeletal:     Cervical back: Normal range of motion and neck supple.     Comments: There is no pain for palpation at the shoulders.  However, there is 0 degrees rotation externally bilaterally.  About 60 to degrees of internal rotation on the right and only about 45 degrees on the left.  Abduction on the left is about 10 to 20 degrees.  Abduction on the right is about 45 degrees.  There is no numbness or tingling mentioned and pulses are intact for both upper extremities.  Neurological:     Mental Status: He is alert and  oriented to person, place, and time.   XR: bone on bone at right [facility] joint, near bone on bone on the left. Preliminary reading done by Babetta Bologna    Assessment & Plan:  Chronic pain of both shoulders -     DG Shoulder Left; Future -     DG Shoulder Right; Future -     Betamethasone Sod Phos & Acet  Other orders -     Celecoxib; Take 2 capsules (200 mg total) by mouth daily. With food  Dispense: 90 capsule; Refill: 0  Due to the patient's pain level and restricted range of motion I asked him to take just 1 Celebrex daily for the time being and follow-up soon with Dr. Steen Eden to make sure the impact on his kidney was not excessive.   Follow-up: Return in about 1 month (around 02/07/2024), or With PCP for check of kidney function.Roise Cleaver, M.D.

## 2024-01-12 ENCOUNTER — Ambulatory Visit: Payer: Self-pay | Admitting: Family Medicine

## 2024-01-23 DIAGNOSIS — Z961 Presence of intraocular lens: Secondary | ICD-10-CM | POA: Diagnosis not present

## 2024-01-23 DIAGNOSIS — H43811 Vitreous degeneration, right eye: Secondary | ICD-10-CM | POA: Diagnosis not present

## 2024-02-18 ENCOUNTER — Other Ambulatory Visit: Payer: Self-pay | Admitting: Family Medicine

## 2024-03-11 DIAGNOSIS — H27131 Posterior dislocation of lens, right eye: Secondary | ICD-10-CM | POA: Diagnosis not present

## 2024-03-11 DIAGNOSIS — I10 Essential (primary) hypertension: Secondary | ICD-10-CM | POA: Diagnosis not present

## 2024-03-11 DIAGNOSIS — Z961 Presence of intraocular lens: Secondary | ICD-10-CM | POA: Diagnosis not present

## 2024-03-18 ENCOUNTER — Ambulatory Visit: Payer: PPO | Admitting: Family Medicine

## 2024-03-18 ENCOUNTER — Encounter: Payer: Self-pay | Admitting: Family Medicine

## 2024-03-18 VITALS — BP 128/77 | HR 109 | Ht 71.0 in | Wt 241.0 lb

## 2024-03-18 DIAGNOSIS — I2510 Atherosclerotic heart disease of native coronary artery without angina pectoris: Secondary | ICD-10-CM | POA: Diagnosis not present

## 2024-03-18 DIAGNOSIS — Z0000 Encounter for general adult medical examination without abnormal findings: Secondary | ICD-10-CM

## 2024-03-18 DIAGNOSIS — I480 Paroxysmal atrial fibrillation: Secondary | ICD-10-CM | POA: Diagnosis not present

## 2024-03-18 DIAGNOSIS — E7800 Pure hypercholesterolemia, unspecified: Secondary | ICD-10-CM | POA: Diagnosis not present

## 2024-03-18 DIAGNOSIS — Z0001 Encounter for general adult medical examination with abnormal findings: Secondary | ICD-10-CM

## 2024-03-18 DIAGNOSIS — E785 Hyperlipidemia, unspecified: Secondary | ICD-10-CM

## 2024-03-18 DIAGNOSIS — I10 Essential (primary) hypertension: Secondary | ICD-10-CM

## 2024-03-18 LAB — LIPID PANEL

## 2024-03-18 NOTE — Progress Notes (Signed)
 BP 128/77   Pulse (!) 109   Ht 5' 11 (1.803 m)   Wt 241 lb (109.3 kg)   SpO2 92%   BMI 33.61 kg/m    Subjective:   Patient ID: Marcus [name], male    DOB: Apr 22, 1933, 89 y.o.   MRN: [id number]  HPI: Marcus [name] is a 89 y.o. male presenting on 03/18/2024 for Medical Management of Chronic Issues (CPE), Hyperlipidemia, Hypertension, and Hypothyroidism   HPI Physical exam Patient denies any chest pain, shortness of breath, headaches or vision issues, abdominal complaints, diarrhea, nausea, vomiting, or joint issues.   Hyperlipidemia and CAD and dyslipidemia Patient is coming in for recheck of his hyperlipidemia. The patient is currently taking atorvastatin  and Xarelto . They deny any issues with myalgias or history of liver damage from it. They deny any focal numbness or weakness or chest pain.   Hypertension and A-fib Patient is currently on furosemide  and amlodipine  and metoprolol , and their blood pressure today is 128/87. Patient denies any lightheadedness or dizziness. Patient denies headaches, blurred vision, chest pains, shortness of breath, or weakness. Denies any side effects from medication and is content with current medication.   Hypothyroidism recheck Patient is coming in for thyroid  recheck today as well. They deny any issues with hair changes or heat or cold problems or diarrhea or constipation. They deny any chest pain or palpitations. They are currently on levothyroxine  50 micrograms   Relevant past medical, surgical, family and social history reviewed and updated as indicated. Interim medical history since our last visit reviewed. Allergies and medications reviewed and updated.  Review of Systems  Constitutional:  Negative for chills and fever.  HENT:  Negative for ear pain and tinnitus.   Eyes:  Negative for pain and discharge.  Respiratory:  Negative for cough, shortness of breath and wheezing.   Cardiovascular:  Negative for chest pain, palpitations and leg  swelling.  Gastrointestinal:  Negative for abdominal pain, blood in stool, constipation and diarrhea.  Genitourinary:  Negative for dysuria and hematuria.  Musculoskeletal:  Negative for back pain, gait problem and myalgias.  Skin:  Negative for rash.  Neurological:  Negative for dizziness, weakness and headaches.  Psychiatric/Behavioral:  Negative for suicidal ideas.   All other systems reviewed and are negative.   Per HPI unless specifically indicated above   Allergies as of 03/18/2024   No Known Allergies      Medication List        Accurate as of March 18, 2024 10:00 AM. If you have any questions, ask your nurse or doctor.          allopurinol  300 MG tablet Commonly known as: ZYLOPRIM  Take 1 tablet (300 mg total) by mouth daily.   amLODipine  10 MG tablet Commonly known as: NORVASC  Take 1 tablet (10 mg total) by mouth daily.   atorvastatin  80 MG tablet Commonly known as: LIPITOR  Take 1 tablet (80 mg total) by mouth daily.   celecoxib  100 MG capsule Commonly known as: CELEBREX  TAKE 2 CAPSULES (200 MG TOTAL) BY MOUTH DAILY. WITH FOOD   doxazosin  4 MG tablet Commonly known as: CARDURA  Take 0.5 tablets (2 mg total) by mouth daily. Take 1/2 tablet by mouth daily   ferrous gluconate  324 MG tablet Commonly known as: FERGON Take 324 mg by mouth 2 (two) times daily with a meal.   furosemide  20 MG tablet Commonly known as: LASIX  Take 1 tablet (20 mg total) by mouth daily.   levothyroxine  50 MCG tablet  Commonly known as: SYNTHROID  Take 1 tablet (50 mcg total) by mouth daily.   metoprolol  tartrate 25 MG tablet Commonly known as: LOPRESSOR  Take 1 tablet (25 mg total) by mouth 2 (two) times daily.   potassium chloride  SA 20 MEQ tablet Commonly known as: Klor-Con  M20 Take 1 tablet (20 mEq total) by mouth 2 (two) times daily.   rivaroxaban  20 MG Tabs tablet Commonly known as: Xarelto  Take 1 tablet (20 mg total) by mouth daily with supper.          Objective:   BP 128/77   Pulse (!) 109   Ht 5' 11 (1.803 m)   Wt 241 lb (109.3 kg)   SpO2 92%   BMI 33.61 kg/m   Wt Readings from Last 3 Encounters:  03/18/24 241 lb (109.3 kg)  01/07/24 241 lb (109.3 kg)  09/18/23 248 lb (112.5 kg)    Physical Exam Vitals reviewed.  Constitutional:      General: He is not in acute distress.    Appearance: He is well-developed. He is not diaphoretic.  HENT:     Right Ear: External ear normal.     Left Ear: External ear normal.     Nose: Nose normal.     Mouth/Throat:     Pharynx: No oropharyngeal exudate.  Eyes:     General: No scleral icterus.    Conjunctiva/sclera: Conjunctivae normal.  Neck:     Thyroid : No thyromegaly.  Cardiovascular:     Rate and Rhythm: Normal rate and regular rhythm.     Heart sounds: Normal heart sounds. No murmur heard. Pulmonary:     Effort: Pulmonary effort is normal. No respiratory distress.     Breath sounds: Normal breath sounds. No wheezing.  Abdominal:     General: Bowel sounds are normal. There is no distension.     Palpations: Abdomen is soft.     Tenderness: There is no abdominal tenderness. There is no guarding or rebound.  Musculoskeletal:        General: No swelling. Normal range of motion.     Cervical back: Neck supple.  Lymphadenopathy:     Cervical: No cervical adenopathy.  Skin:    General: Skin is warm and dry.     Findings: No rash.  Neurological:     Mental Status: He is alert and oriented to person, place, and time.     Coordination: Coordination normal.  Psychiatric:        Behavior: Behavior normal.       Assessment & Plan:   Problem List Items Addressed This Visit       Cardiovascular and Mediastinum   HTN (hypertension)   Relevant Orders   CBC with Differential/Platelet   CMP14+EGFR   Lipid panel   Paroxysmal atrial fibrillation (HCC)   Relevant Orders   CBC with Differential/Platelet   CMP14+EGFR   Lipid panel   CORONARY ATHEROSCLEROSIS NATIVE  CORONARY ARTERY     Other   Pure hypercholesterolemia   Relevant Orders   Lipid panel   Dyslipidemia   Relevant Orders   CBC with Differential/Platelet   CMP14+EGFR   Lipid panel   Other Visit Diagnoses       Physical exam    -  Primary       Continue current medicine, seems to be doing okay, will do blood work today.  No changes blood pressure looks good today. Follow up plan: Return in about 6 months (around 09/18/2024), or if symptoms worsen or fail  to improve, for Thyroid  and cholesterol and hypertension recheck.  Counseling provided for all of the vaccine components Orders Placed This Encounter  Procedures   CBC with Differential/Platelet   CMP14+EGFR   Lipid panel    Fonda Levins, MD [facility] 03/18/2024, 10:00 AM

## 2024-03-19 LAB — CMP14+EGFR
ALT: 7 IU/L (ref 0–44)
AST: 11 IU/L (ref 0–40)
Albumin: 4 g/dL (ref 3.7–4.7)
Alkaline Phosphatase: 97 IU/L (ref 44–121)
BUN/Creatinine Ratio: 14 (ref 10–24)
BUN: 14 mg/dL (ref 8–27)
Bilirubin Total: 0.9 mg/dL (ref 0.0–1.2)
CO2: 24 mmol/L (ref 20–29)
Calcium: 8.9 mg/dL (ref 8.6–10.2)
Chloride: 100 mmol/L (ref 96–106)
Creatinine, Ser: 1 mg/dL (ref 0.76–1.27)
Globulin, Total: 2.3 g/dL (ref 1.5–4.5)
Glucose: 91 mg/dL (ref 70–99)
Potassium: 4.1 mmol/L (ref 3.5–5.2)
Sodium: 140 mmol/L (ref 134–144)
Total Protein: 6.3 g/dL (ref 6.0–8.5)
eGFR: 72 mL/min/1.73 (ref >59)

## 2024-03-19 LAB — LIPID PANEL
Cholesterol, Total: 130 mg/dL (ref 100–199)
HDL: 46 mg/dL (ref >39)
LDL CALC COMMENT:: 2.8 ratio (ref 0.0–5.0)
LDL Chol Calc (NIH): 66 mg/dL (ref 0–99)
Triglycerides: 97 mg/dL (ref 0–149)
VLDL Cholesterol Cal: 18 mg/dL (ref 5–40)

## 2024-03-19 LAB — CBC WITH DIFFERENTIAL/PLATELET
Basophils Absolute: 0 x10E3/uL (ref 0.0–0.2)
Basos: 1 %
EOS (ABSOLUTE): 0.2 x10E3/uL (ref 0.0–0.4)
Eos: 4 %
Hematocrit: 40.3 % (ref 37.5–51.0)
Hemoglobin: 12.7 g/dL — ABNORMAL LOW (ref 13.0–17.7)
Immature Grans (Abs): 0 x10E3/uL (ref 0.0–0.1)
Immature Granulocytes: 0 %
Lymphocytes Absolute: 1.8 x10E3/uL (ref 0.7–3.1)
Lymphs: 30 %
MCH: 30.3 pg (ref 26.6–33.0)
MCHC: 31.5 g/dL (ref 31.5–35.7)
MCV: 96 fL (ref 79–97)
Monocytes Absolute: 0.8 x10E3/uL (ref 0.1–0.9)
Monocytes: 13 %
Neutrophils Absolute: 3.1 x10E3/uL (ref 1.4–7.0)
Neutrophils: 52 %
Platelets: 140 x10E3/uL — ABNORMAL LOW (ref 150–450)
RBC: 4.19 x10E6/uL (ref 4.14–5.80)
RDW: 12.9 % (ref 11.6–15.4)
WBC: 5.9 x10E3/uL (ref 3.4–10.8)

## 2024-03-26 ENCOUNTER — Ambulatory Visit: Payer: Self-pay | Admitting: Family Medicine

## 2024-03-31 ENCOUNTER — Encounter (INDEPENDENT_AMBULATORY_CARE_PROVIDER_SITE_OTHER): Admitting: Ophthalmology

## 2024-04-03 ENCOUNTER — Other Ambulatory Visit: Payer: Self-pay | Admitting: Nurse Practitioner

## 2024-05-07 ENCOUNTER — Ambulatory Visit

## 2024-05-07 VITALS — BP 128/77 | HR 109 | Ht 71.0 in | Wt 241.0 lb

## 2024-05-07 DIAGNOSIS — Z0000 Encounter for general adult medical examination without abnormal findings: Secondary | ICD-10-CM | POA: Diagnosis not present

## 2024-05-07 NOTE — Progress Notes (Signed)
 Subjective:   Marcus [name] is a 89 y.o. who presents for a Medicare Wellness preventive visit.  As a reminder, Annual Wellness Visits don't include a physical exam, and some assessments may be limited, especially if this visit is performed virtually. We may recommend an in-person follow-up visit with your provider if needed.  Visit Complete: Virtual I connected with  Marcus [name] on 05/07/24 by a audio enabled telemedicine application and verified that I am speaking with the correct person using two identifiers.  Patient Location: Home  Provider Location: Home Office  I discussed the limitations of evaluation and management by telemedicine. The patient expressed understanding and agreed to proceed.  Vital Signs: Because this visit was a virtual/telehealth visit, some criteria may be missing or patient reported. Any vitals not documented were not able to be obtained and vitals that have been documented are patient reported.  VideoDeclined- This patient declined Librarian, academic. Therefore the visit was completed with audio only.  Persons Participating in Visit: Patient.  AWV Questionnaire: No: Patient Medicare AWV questionnaire was not completed prior to this visit.  Cardiac Risk Factors include: advanced age (>66men, >44 women);dyslipidemia;hypertension;male gender     Objective:    Today's Vitals   05/07/24 1606  BP: 128/77  Pulse: (!) 109  Weight: 241 lb (109.3 kg)  Height: 5' 11 (1.803 m)   Body mass index is 33.61 kg/m.     05/07/2024    4:01 PM 08/13/2022    2:53 PM 03/14/2021    4:05 PM 01/15/2019    2:39 PM 07/22/2018    5:51 AM 06/25/2015   11:00 PM 06/25/2015    7:22 PM  Advanced Directives  Does Patient Have a Medical Advance Directive? Yes No No Yes No  No  No   Type of Clinical research associate of [place];Living will     Copy of Healthcare Power of Attorney in Chart?    No - copy  requested      Would patient like information on creating a medical advance directive?  No - Patient declined No - Patient declined  Yes (ED - Information included in AVS)  No - patient declined information  No - patient declined information      Data saved with a previous flowsheet row definition    Current Medications (verified) Outpatient Encounter Medications as of 05/07/2024  Medication Sig   allopurinol  (ZYLOPRIM ) 300 MG tablet Take 1 tablet (300 mg total) by mouth daily.   amLODipine  (NORVASC ) 10 MG tablet Take 1 tablet (10 mg total) by mouth daily.   atorvastatin  (LIPITOR ) 80 MG tablet Take 1 tablet (80 mg total) by mouth daily.   celecoxib  (CELEBREX ) 100 MG capsule TAKE 2 CAPSULES (200 MG TOTAL) BY MOUTH DAILY. WITH FOOD   doxazosin  (CARDURA ) 4 MG tablet Take 0.5 tablets (2 mg total) by mouth daily. Take 1/2 tablet by mouth daily   ferrous gluconate  (FERGON) 324 MG tablet Take 324 mg by mouth 2 (two) times daily with a meal.   furosemide  (LASIX ) 20 MG tablet Take 1 tablet (20 mg total) by mouth daily.   levothyroxine  (SYNTHROID ) 50 MCG tablet Take 1 tablet (50 mcg total) by mouth daily.   metoprolol  tartrate (LOPRESSOR ) 25 MG tablet Take 1 tablet (25 mg total) by mouth 2 (two) times daily.   potassium chloride  SA (KLOR-CON  M20) 20 MEQ tablet Take 1 tablet (20 mEq total) by mouth 2 (two) times daily.  rivaroxaban  (XARELTO ) 20 MG TABS tablet Take 1 tablet (20 mg total) by mouth daily with supper.   No facility-administered encounter medications on file as of 05/07/2024.    Allergies (verified) Patient has no known allergies.   History: Past Medical History:  Diagnosis Date   Angina    Arthritis    Atrial flutter (HCC)    Chronic kidney disease    hx of BPH   Coronary atherosclerosis of native coronary artery    Gout    Hypertension    Myocardial infarction (HCC)    Other and unspecified hyperlipidemia    Pure hypercholesterolemia    Past Surgical History:  Procedure  Laterality Date   CATARACTS     CHEST TUBE INSERTION  11/22/2011   Procedure: CHEST TUBE INSERTION;  Surgeon: Maude Fleeta Ochoa, MD;  Location: [facility] OR;  Service: Thoracic;  Laterality: Right;   CORONARY ARTERY BYPASS GRAFT  11/20/2011   Procedure: CORONARY ARTERY BYPASS GRAFTING (CABG);  Surgeon: Maude Fleeta Ochoa, MD;  Location: [facility] OR;  Service: Open Heart Surgery;  Laterality: N/A;  Times 3. On Pump. Using endoscopically harvested right greater saphenous vein and left internal mammary artery.    ELECTROPHYSIOLOGIC STUDY N/A 06/27/2015   Procedure: Cardioversion;  Surgeon: Elspeth JAYSON Sage, MD;  Location: [facility] INVASIVE CV LAB;  Service: Cardiovascular;  Laterality: N/A;   LEFT AND RIGHT HEART CATHETERIZATION WITH CORONARY ANGIOGRAM N/A 11/16/2011   Procedure: LEFT AND RIGHT HEART CATHETERIZATION WITH CORONARY ANGIOGRAM;  Surgeon: Ozell Fell, MD;  Location: [facility] CATH LAB;  Service: Cardiovascular;  Laterality: N/A;   MAZE  11/20/2011   Procedure: MAZE;  Surgeon: Maude Fleeta Ochoa, MD;  Location: [facility] OR;  Service: Open Heart Surgery;  Laterality: N/A;   Family History  Problem Relation Age of Onset   Colon cancer Son    Social History   Socioeconomic History   Marital status: Widowed    Spouse name: [name]   Number of children: 2   Years of education: Not on file   Highest education level: High school graduate  Occupational History   Occupation: works with heavy equipment  Tobacco Use   Smoking status: Never   Smokeless tobacco: Never  Vaping Use   Vaping status: Never Used  Substance and Sexual Activity   Alcohol use: Not Currently    Comment: hasn't had an alcoholic drink in 7 years   Drug use: No   Sexual activity: Never    Birth control/protection: None  Other Topics Concern   Not on file  Social History Narrative   Not on file   Social Drivers of Health   Financial Resource Strain: Low Risk  (05/07/2024)   Overall Financial Resource Strain (CARDIA)    Difficulty of Paying Living  Expenses: Not hard at all  Food Insecurity: No Food Insecurity (05/07/2024)   Hunger Vital Sign    Worried About Running Out of Food in the Last Year: Never true    Ran Out of Food in the Last Year: Never true  Transportation Needs: No Transportation Needs (05/07/2024)   PRAPARE - Administrator, Civil Service (Medical): No    Lack of Transportation (Non-Medical): No  Physical Activity: Inactive (05/07/2024)   Exercise Vital Sign    Days of Exercise per Week: 0 days    Minutes of Exercise per Session: 0 min  Stress: No Stress Concern Present (05/07/2024)   [employer] of Occupational Health - Occupational Stress Questionnaire    Feeling of Stress:  Not at all  Social Connections: Socially Isolated (05/07/2024)   Social Connection and Isolation Panel    Frequency of Communication with Friends and Family: Once a week    Frequency of Social Gatherings with Friends and Family: Once a week    Attends Religious Services: More than 4 times per year    Active Member of [employer] or Organizations: No    Attends Banker Meetings: Never    Marital Status: Widowed    Tobacco Counseling Counseling given: Yes    Clinical Intake:  Pre-visit preparation completed: Yes  Pain : No/denies pain     Nutritional Risks: None Diabetes: No  Lab Results  Component Value Date   HGBA1C 5.8 (H) 11/19/2011   HGBA1C 5.2 11/18/2011     How often do you need to have someone help you when you read instructions, pamphlets, or other written materials from your doctor or pharmacy?: 1 - Never  Interpreter Needed?: No  Information entered by :: alia t/cma   Activities of Daily Living     05/07/2024    3:58 PM  In your present state of health, do you have any difficulty performing the following activities:  Hearing? 1  Vision? 0  Difficulty concentrating or making decisions? 0  Walking or climbing stairs? 0  Dressing or bathing? 0  Doing errands, shopping? 0  Preparing  Food and eating ? N  Using the Toilet? N  In the past six months, have you accidently leaked urine? Y  Do you have problems with loss of bowel control? N  Managing your Medications? Y  Comment pt's daughter  Managing your Finances? N  Housekeeping or managing your Housekeeping? N    Patient Care Team: Dettinger, Fonda LABOR, MD as PCP - General (Family Medicine) Obadiah Coy, MD as Attending Physician (Cardiothoracic Surgery) de Cyndia Mal BRAVO, MD (Inactive) (Cardiology) Lavona Agent, MD as Consulting Physician (Cardiology)  I have updated your Care Teams any recent Medical Services you may have received from other providers in the past year.     Assessment:   This is a routine wellness examination for Marcus [name].  Hearing/Vision screen Hearing Screening - Comments:: Pt have hearing aids Vision Screening - Comments:: Pt wear glasses/pt [facility] [place]/last ov 2mos ago   Goals Addressed             This Visit's Progress    Patient Stated   On track    03/14/2021 AWV Goal: Fall Prevention  Over the next year, patient will decrease their risk for falls by: Using assistive devices, such as a cane or walker, as needed Identifying fall risks within their home and correcting them by: Removing throw rugs Adding handrails to stairs or ramps Removing clutter and keeping a clear pathway throughout the home Increasing light, especially at night Adding shower handles/bars Raising toilet seat Identifying potential personal risk factors for falls: Medication side effects Incontinence/urgency Vestibular dysfunction Hearing loss Musculoskeletal disorders Neurological disorders Orthostatic hypotension         Depression Screen     05/07/2024    4:02 PM 01/07/2024    8:12 AM 09/18/2023   10:23 AM 10/10/2022    9:49 AM 08/13/2022    2:56 PM 04/09/2022   10:20 AM 01/16/2022    2:17 PM  PHQ 2/9 Scores  PHQ - 2 Score 0 0 0 0 0  0  PHQ- 9 Score  0  0   0  Exception  Documentation  Patient refusal     Fall Risk     05/07/2024    3:57 PM 09/18/2023   10:23 AM 10/10/2022    9:49 AM 08/13/2022    2:53 PM 04/09/2022   10:20 AM  Fall Risk   Falls in the past year? 0 0 0 0 0  Number falls in past yr: 0   0   Injury with Fall? 0   0   Risk for fall due to : No Fall Risks   No Fall Risks   Follow up Falls evaluation completed   Falls evaluation completed;Education provided;Falls prevention discussed       Data saved with a previous flowsheet row definition    MEDICARE RISK AT HOME:  Medicare Risk at Home Any stairs in or around the home?: No If so, are there any without handrails?: No Home free of loose throw rugs in walkways, pet beds, electrical cords, etc?: Yes Adequate lighting in your home to reduce risk of falls?: Yes Life alert?: No Use of a cane, walker or w/c?: No Grab bars in the bathroom?: Yes Shower chair or bench in shower?: No Elevated toilet seat or a handicapped toilet?: No  TIMED UP AND GO:  Was the test performed?  no  Cognitive Function: 6CIT completed        05/07/2024    4:04 PM 08/13/2022    2:55 PM 03/14/2021    4:07 PM 01/15/2019    2:42 PM  6CIT Screen  What Year? 0 points 0 points 0 points 0 points  What month? 0 points 0 points 0 points 0 points  What time? 0 points 0 points 0 points 0 points  Count back from 20 0 points 0 points 0 points 0 points  Months in reverse 4 points 0 points 0 points 0 points  Repeat phrase 0 points 2 points 0 points 2 points  Total Score 4 points 2 points 0 points 2 points    Immunizations Immunization History  Administered Date(s) Administered   Fluad Quad(high Dose 65+) 05/09/2019, 05/14/2022   INFLUENZA, HIGH DOSE SEASONAL PF 05/24/2016, 05/24/2017, 05/18/2020, 05/04/2023   Influenza,inj,Quad PF,6+ Mos 06/11/2018   Influenza-Unspecified 05/15/2014, 05/22/2021   Moderna Covid-19 Fall Seasonal Vaccine 91yrs & older 05/11/2023   Moderna Covid-19 Vaccine Bivalent Booster  102yrs & up 08/08/2021   Moderna Sars-Covid-2 Vaccination 10/28/2019, 11/25/2019, 07/19/2020, 02/14/2021   Pfizer(Comirnaty)Fall Seasonal Vaccine 12 years and older 06/18/2022   Td 08/27/2012    Screening Tests Health Maintenance  Topic Date Due   Influenza Vaccine  03/27/2024   COVID-19 Vaccine (8 - Moderna risk 2024-25 season) 04/27/2024   DTaP/Tdap/Td (2 - Tdap) 09/17/2024 (Originally 08/27/2022)   Pneumococcal Vaccine: 50+ Years (1 of 2 - PCV) 09/17/2024 (Originally 09/07/1951)   Zoster Vaccines- Shingrix (1 of 2) 03/18/2025 (Originally 09/07/1951)   Medicare Annual Wellness (AWV)  05/07/2025   HPV VACCINES  Aged Out   Meningococcal B Vaccine  Aged Out    Health Maintenance Items Addressed: See Nurse Notes at the end of this note  Additional Screening:  Vision Screening: Recommended annual ophthalmology exams for early detection of glaucoma and other disorders of the eye. Is the patient up to date with their annual eye exam?  Yes  Who is the provider or what is the name of the office in which the patient attends annual eye exams? [facility] In [facility]  Dental Screening: Recommended annual dental exams for proper oral hygiene  Community Resource Referral / Chronic Care Management:  CRR required this visit?  No   CCM required this visit?  No   Plan:    I have personally reviewed and noted the following in the patient's chart:   Medical and social history Use of alcohol, tobacco or illicit drugs  Current medications and supplements including opioid prescriptions. Patient is not currently taking opioid prescriptions. Functional ability and status Nutritional status Physical activity Advanced directives List of other physicians Hospitalizations, surgeries, and ER visits in previous 12 months Vitals Screenings to include cognitive, depression, and falls Referrals and appointments  In addition, I have reviewed and discussed with patient certain preventive protocols,  quality metrics, and best practice recommendations. A written personalized care plan for preventive services as well as general preventive health recommendations were provided to patient.   Marcus [name], CMA   05/07/2024   After Visit Summary: (Declined) Due to this being a telephonic visit, with patients personalized plan was offered to patient but patient Declined AVS at this time   Notes: Nothing significant to report at this time.

## 2024-05-07 NOTE — Patient Instructions (Signed)
 [name],  Thank you for taking the time for your Medicare Wellness Visit. I appreciate your continued commitment to your health goals. Please review the care plan we discussed, and feel free to reach out if I can assist you further.  Medicare recommends these wellness visits once per year to help you and your care team stay ahead of potential health issues. These visits are designed to focus on prevention, allowing your provider to concentrate on managing your acute and chronic conditions during your regular appointments.  Please note that Annual Wellness Visits do not include a physical exam. Some assessments may be limited, especially if the visit was conducted virtually. If needed, we may recommend a separate in-person follow-up with your provider.  Ongoing Care Seeing your primary care provider every 3 to 6 months helps us  monitor your health and provide consistent, personalized care.   Referrals If a referral was made during today's visit and you haven't received any updates within two weeks, please contact the referred provider directly to check on the status.  Recommended Screenings:  Health Maintenance  Topic Date Due   Medicare Annual Wellness Visit  08/14/2023   Flu Shot  03/27/2024   COVID-19 Vaccine (8 - Moderna risk 2024-25 season) 04/27/2024   DTaP/Tdap/Td vaccine (2 - Tdap) 09/17/2024*   Pneumococcal Vaccine for age over 65 (1 of 2 - PCV) 09/17/2024*   Zoster (Shingles) Vaccine (1 of 2) 03/18/2025*   HPV Vaccine  Aged Out   Meningitis B Vaccine  Aged Out  *Topic was postponed. The date shown is not the original due date.       05/07/2024    4:01 PM  Advanced Directives  Does Patient Have a Medical Advance Directive? Yes  Type of Advance Directive Healthcare Power of [facility]   Advance Care Planning is important because it: Ensures you receive medical care that aligns with your values, goals, and preferences. Provides guidance to your family and loved ones,  reducing the emotional burden of decision-making during critical moments.  Vision: Annual vision screenings are recommended for early detection of glaucoma, cataracts, and diabetic retinopathy. These exams can also reveal signs of chronic conditions such as diabetes and high blood pressure.  Dental: Annual dental screenings help detect early signs of oral cancer, gum disease, and other conditions linked to overall health, including heart disease and diabetes.  Please see the attached documents for additional preventive care recommendations.

## 2024-07-17 ENCOUNTER — Other Ambulatory Visit: Payer: Self-pay | Admitting: Family Medicine

## 2024-07-31 DIAGNOSIS — H43393 Other vitreous opacities, bilateral: Secondary | ICD-10-CM | POA: Diagnosis not present

## 2024-07-31 DIAGNOSIS — H40033 Anatomical narrow angle, bilateral: Secondary | ICD-10-CM | POA: Diagnosis not present

## 2024-08-13 ENCOUNTER — Ambulatory Visit (INDEPENDENT_AMBULATORY_CARE_PROVIDER_SITE_OTHER): Admitting: Family

## 2024-08-13 ENCOUNTER — Encounter: Payer: Self-pay | Admitting: Family

## 2024-08-13 VITALS — BP 127/75 | HR 94 | Temp 98.4°F | Ht 71.0 in | Wt 239.8 lb

## 2024-08-13 DIAGNOSIS — M5441 Lumbago with sciatica, right side: Secondary | ICD-10-CM | POA: Diagnosis not present

## 2024-08-13 DIAGNOSIS — I480 Paroxysmal atrial fibrillation: Secondary | ICD-10-CM

## 2024-08-13 DIAGNOSIS — M150 Primary generalized (osteo)arthritis: Secondary | ICD-10-CM

## 2024-08-13 MED ORDER — METHYLPREDNISOLONE ACETATE 80 MG/ML IJ SUSP
80.0000 mg | Freq: Once | INTRAMUSCULAR | Status: AC
  Administered 2024-08-13: 12:00:00 80 mg via INTRAMUSCULAR

## 2024-08-13 MED ORDER — TRAMADOL HCL 50 MG PO TABS: 50.0000 mg | ORAL_TABLET | Freq: Four times a day (QID) | ORAL | 0 refills | Status: AC | PRN

## 2024-08-13 NOTE — Progress Notes (Signed)
 Subjective:    Patient ID: Marcus [name], male    DOB: 1933-04-28, 89 y.o.   MRN: [id number]  Chief Complaint  Patient presents with   Hip Pain    Right hip woke up with it 3-4 days ago runs down to knee   Pt presents to the office today with right buttocks  pain that started 3 days ago. Denies any injury.   He has A Fib and takes xarelto  daily.   He has osteoarthritis of his shoulder and has been also taking celebrex  100 mg daily.   Back Pain This is a new problem. The current episode started in the past 7 days. The problem occurs intermittently. The problem is unchanged. The pain is present in the gluteal. The pain radiates to the right knee. The pain is at a severity of 10/10. The pain is moderate. The symptoms are aggravated by bending and standing. Associated symptoms include leg pain.      Review of Systems  Musculoskeletal:  Positive for back pain.  All other systems reviewed and are negative.   Social History   Socioeconomic History   Marital status: Widowed    Spouse name: [name]   Number of children: 2   Years of education: Not on file   Highest education level: High school graduate  Occupational History   Occupation: works with heavy equipment  Tobacco Use   Smoking status: Never   Smokeless tobacco: Never  Vaping Use   Vaping status: Never Used  Substance and Sexual Activity   Alcohol use: Not Currently    Comment: hasn't had an alcoholic drink in 7 years   Drug use: No   Sexual activity: Never    Birth control/protection: None  Other Topics Concern   Not on file  Social History Narrative   Not on file   Social Drivers of Health   Tobacco Use: Low Risk (08/13/2024)   Patient History    Smoking Tobacco Use: Never    Smokeless Tobacco Use: Never    Passive Exposure: Not on file  Financial Resource Strain: Low Risk (05/07/2024)   Overall Financial Resource Strain (CARDIA)    Difficulty of Paying Living Expenses: Not hard at all  Food Insecurity:  No Food Insecurity (05/07/2024)   Epic    Worried About Programme Researcher, Broadcasting/film/video in the Last Year: Never true    Ran Out of Food in the Last Year: Never true  Transportation Needs: No Transportation Needs (05/07/2024)   Epic    Lack of Transportation (Medical): No    Lack of Transportation (Non-Medical): No  Physical Activity: Inactive (05/07/2024)   Exercise Vital Sign    Days of Exercise per Week: 0 days    Minutes of Exercise per Session: 0 min  Stress: No Stress Concern Present (05/07/2024)   [employer] of Occupational Health - Occupational Stress Questionnaire    Feeling of Stress: Not at all  Social Connections: Socially Isolated (05/07/2024)   Social Connection and Isolation Panel    Frequency of Communication with Friends and Family: Once a week    Frequency of Social Gatherings with Friends and Family: Once a week    Attends Religious Services: More than 4 times per year    Active Member of [employer] or Organizations: No    Attends Banker Meetings: Never    Marital Status: Widowed  Depression (PHQ2-9): Low Risk (08/13/2024)   Depression (PHQ2-9)    PHQ-2 Score: 0  Alcohol Screen: Low Risk (  05/07/2024)   Alcohol Screen    Last Alcohol Screening Score (AUDIT): 0  Housing: Unknown (05/07/2024)   Epic    Unable to Pay for Housing in the Last Year: No    Number of Times Moved in the Last Year: Not on file    Homeless in the Last Year: No  Utilities: Not At Risk (05/07/2024)   Epic    Threatened with loss of utilities: No  Health Literacy: Adequate Health Literacy (05/07/2024)   B1300 Health Literacy    Frequency of need for help with medical instructions: Never   Family History  Problem Relation Age of Onset   Colon cancer Son         Objective:   Physical Exam Vitals reviewed.  Constitutional:      General: He is not in acute distress.    Appearance: He is well-developed.  HENT:     Head: Normocephalic.  Eyes:     General:        Right eye: No  discharge.        Left eye: No discharge.     Pupils: Pupils are equal, round, and reactive to light.  Neck:     Thyroid : No thyromegaly.  Cardiovascular:     Rate and Rhythm: Normal rate and regular rhythm.     Heart sounds: Normal heart sounds. No murmur heard. Pulmonary:     Effort: Pulmonary effort is normal. No respiratory distress.     Breath sounds: Normal breath sounds. No wheezing.  Abdominal:     General: Bowel sounds are normal. There is no distension.     Palpations: Abdomen is soft.     Tenderness: There is no abdominal tenderness.  Musculoskeletal:        General: No tenderness. Normal range of motion.     Cervical back: Normal range of motion and neck supple.     Comments: Full ROM of right hip, negative SLR  Skin:    General: Skin is warm and dry.     Findings: No erythema or rash.  Neurological:     Mental Status: He is alert and oriented to person, place, and time.     Cranial Nerves: No cranial nerve deficit.     Deep Tendon Reflexes: Reflexes are normal and symmetric.  Psychiatric:        Behavior: Behavior normal.        Thought Content: Thought content normal.        Judgment: Judgment normal.       BP 127/75   Pulse 94   Temp 98.4 F (36.9 C)   Ht 5' 11 (1.803 m)   Wt 239 lb 12.8 oz (108.8 kg)   SpO2 94%   BMI 33.45 kg/m      Assessment & Plan:  Marcus [name] comes in today with chief complaint of Hip Pain (Right hip woke up with it 3-4 days ago runs down to knee)   Diagnosis and orders addressed:  1. Acute right-sided low back pain with right-sided sciatica (Primary) - traMADol  (ULTRAM ) 50 MG tablet; Take 1 tablet (50 mg total) by mouth every 6 (six) hours as needed for up to 7 days.  Dispense: 20 tablet; Refill: 0 - methylPREDNISolone  acetate (DEPO-MEDROL ) injection 80 mg  2. Primary osteoarthritis involving multiple joints - traMADol  (ULTRAM ) 50 MG tablet; Take 1 tablet (50 mg total) by mouth every 6 (six) hours as needed for up to 7  days.  Dispense: 20 tablet; Refill: 0 -  methylPREDNISolone  acetate (DEPO-MEDROL ) injection 80 mg    3. Paroxysmal atrial fibrillation (HCC)    Long discussion about stopping Celebrex  since taking xarelto  20 mg  Discuss risks of bleeding  Will give Ultram  as needed. Take tylenol  first line and Ultram  if tylenol  is not working  ROM exercises, handout given  Continue Xarelto   Handicap form completed and given today Keep follow up with PCP    Bari Learn, FNP

## 2024-08-13 NOTE — Patient Instructions (Signed)

## 2024-08-13 NOTE — Addendum Note (Signed)
 Addended by: MICHELINE KNEE F on: 08/13/2024 12:04 PM   Modules accepted: Orders

## 2024-09-04 ENCOUNTER — Other Ambulatory Visit: Payer: Self-pay | Admitting: Family Medicine

## 2024-09-17 ENCOUNTER — Other Ambulatory Visit: Payer: Self-pay | Admitting: Family Medicine

## 2024-09-18 ENCOUNTER — Other Ambulatory Visit: Payer: Self-pay | Admitting: Family Medicine

## 2024-09-18 ENCOUNTER — Ambulatory Visit: Payer: Self-pay | Admitting: Family Medicine

## 2024-09-18 ENCOUNTER — Encounter: Payer: Self-pay | Admitting: Family Medicine

## 2024-09-18 VITALS — BP 125/66 | HR 105 | Ht 71.0 in | Wt 235.0 lb

## 2024-09-18 DIAGNOSIS — I10 Essential (primary) hypertension: Secondary | ICD-10-CM

## 2024-09-18 DIAGNOSIS — E785 Hyperlipidemia, unspecified: Secondary | ICD-10-CM

## 2024-09-18 DIAGNOSIS — I480 Paroxysmal atrial fibrillation: Secondary | ICD-10-CM

## 2024-09-18 DIAGNOSIS — E039 Hypothyroidism, unspecified: Secondary | ICD-10-CM

## 2024-09-18 DIAGNOSIS — E7800 Pure hypercholesterolemia, unspecified: Secondary | ICD-10-CM

## 2024-09-18 DIAGNOSIS — Z23 Encounter for immunization: Secondary | ICD-10-CM

## 2024-09-18 LAB — CBC WITH DIFF/PLATELET
Basophils Absolute: 0 x10E3/uL (ref 0.0–0.2)
Basos: 1 %
EOS (ABSOLUTE): 0.1 x10E3/uL (ref 0.0–0.4)
Eos: 1 %
Hematocrit: 38.8 % (ref 37.5–51.0)
Hemoglobin: 13 g/dL (ref 13.0–17.7)
Immature Grans (Abs): 0 x10E3/uL (ref 0.0–0.1)
Immature Granulocytes: 0 %
Lymphocytes Absolute: 1.5 x10E3/uL (ref 0.7–3.1)
Lymphs: 25 %
MCH: 31 pg (ref 26.6–33.0)
MCHC: 33.5 g/dL (ref 31.5–35.7)
MCV: 93 fL (ref 79–97)
Monocytes Absolute: 0.6 x10E3/uL (ref 0.1–0.9)
Monocytes: 10 %
Neutrophils Absolute: 3.6 x10E3/uL (ref 1.4–7.0)
Neutrophils: 63 %
Platelets: 166 x10E3/uL (ref 150–450)
RBC: 4.19 x10E6/uL (ref 4.14–5.80)
RDW: 13.2 % (ref 11.6–15.4)
WBC: 5.8 x10E3/uL (ref 3.4–10.8)

## 2024-09-18 LAB — CMP14+EGFR
ALT: 7 IU/L (ref 0–44)
AST: 13 IU/L (ref 0–40)
Albumin: 4 g/dL (ref 3.6–4.6)
Alkaline Phosphatase: 103 IU/L (ref 48–129)
BUN/Creatinine Ratio: 10 (ref 10–24)
BUN: 10 mg/dL (ref 10–36)
Bilirubin Total: 0.9 mg/dL (ref 0.0–1.2)
CO2: 26 mmol/L (ref 20–29)
Calcium: 9.1 mg/dL (ref 8.6–10.2)
Chloride: 98 mmol/L (ref 96–106)
Creatinine, Ser: 1.05 mg/dL (ref 0.76–1.27)
Globulin, Total: 2.3 g/dL (ref 1.5–4.5)
Glucose: 80 mg/dL (ref 70–99)
Potassium: 4.1 mmol/L (ref 3.5–5.2)
Sodium: 141 mmol/L (ref 134–144)
Total Protein: 6.3 g/dL (ref 6.0–8.5)
eGFR: 67 mL/min/1.73

## 2024-09-18 LAB — LIPID PANEL
Chol/HDL Ratio: 2.5 ratio (ref 0.0–5.0)
Cholesterol, Total: 124 mg/dL (ref 100–199)
HDL: 50 mg/dL
LDL Chol Calc (NIH): 55 mg/dL (ref 0–99)
Triglycerides: 103 mg/dL (ref 0–149)
VLDL Cholesterol Cal: 19 mg/dL (ref 5–40)

## 2024-09-18 LAB — TSH: TSH: 1.85 u[IU]/mL (ref 0.450–4.500)

## 2024-09-18 MED ORDER — LEVOTHYROXINE SODIUM 50 MCG PO TABS: 50.0000 ug | ORAL_TABLET | Freq: Every day | ORAL | 3 refills | Status: AC

## 2024-09-18 MED ORDER — POTASSIUM CHLORIDE CRYS ER 20 MEQ PO TBCR: 20.0000 meq | EXTENDED_RELEASE_TABLET | Freq: Two times a day (BID) | ORAL | 3 refills | Status: AC

## 2024-09-18 MED ORDER — FUROSEMIDE 20 MG PO TABS: 20.0000 mg | ORAL_TABLET | Freq: Every day | ORAL | 3 refills | Status: AC

## 2024-09-18 MED ORDER — AMLODIPINE BESYLATE 10 MG PO TABS: 10.0000 mg | ORAL_TABLET | Freq: Every day | ORAL | 3 refills | Status: AC

## 2024-09-18 MED ORDER — ATORVASTATIN CALCIUM 80 MG PO TABS: 80.0000 mg | ORAL_TABLET | Freq: Every day | ORAL | 3 refills | Status: AC

## 2024-09-18 MED ORDER — ALLOPURINOL 300 MG PO TABS: 300.0000 mg | ORAL_TABLET | Freq: Every day | ORAL | 3 refills | Status: AC

## 2024-09-18 MED ORDER — DOXAZOSIN MESYLATE 4 MG PO TABS: 2.0000 mg | ORAL_TABLET | Freq: Every day | ORAL | 3 refills | Status: AC

## 2024-09-18 MED ORDER — RIVAROXABAN 20 MG PO TABS: 20.0000 mg | ORAL_TABLET | Freq: Every day | ORAL | 3 refills | Status: AC

## 2024-09-18 MED ORDER — METOPROLOL TARTRATE 25 MG PO TABS: 25.0000 mg | ORAL_TABLET | Freq: Two times a day (BID) | ORAL | 3 refills | Status: AC

## 2024-09-18 NOTE — Progress Notes (Signed)
 "  BP 125/66   Pulse (!) 105   Ht 5' 11 (1.803 m)   Wt 235 lb (106.6 kg)   SpO2 94%   BMI 32.78 kg/m    Subjective:   Patient ID: Marcus [name], male    DOB: [birth year], 89 y.o.   MRN: [id number]  HPI: Marcus [name] is a 89 y.o. male presenting on 09/18/2024 for Medical Management of Chronic Issues, Hypertension, Hyperlipidemia, and Hypothyroidism   Discussed the use of AI scribe software for clinical note transcription with the patient, who gave verbal consent to proceed.  History of Present Illness   Marcus [name] is a 89 year old male with hypertension and hypothyroidism who presents for a recheck of his conditions.  Generalized weakness and fatigue - Weakness and low energy throughout the week - Describes the week as a 'drag' and head as 'light' - Uncertain if symptoms are related to thyroid  condition - Currently taking thyroid  medication but unsure of its effects  Hypertension and cardiac management - Continues amlodipine , doxazosin , Lasix , and metoprolol  for blood pressure and heart rate control - Has not seen cardiologist recently but plans to schedule follow-up  Musculoskeletal pain - Chronic joint pain and soreness, attributed to weather changes but present 'all the time' - Takes two Tylenol  daily for pain management  Urinary and respiratory symptoms - No dysuria, hematuria, or urinary pain - No dyspnea, cough, or wheezing - Maintains adequate hydration          Relevant past medical, surgical, family and social history reviewed and updated as indicated. Interim medical history since our last visit reviewed. Allergies and medications reviewed and updated.  Review of Systems  Constitutional:  Negative for chills and fever.  Eyes:  Negative for visual disturbance.  Respiratory:  Negative for shortness of breath and wheezing.   Cardiovascular:  Negative for chest pain and leg swelling.  Gastrointestinal:  Negative for abdominal pain.  Genitourinary:  Negative  for dysuria and hematuria.  Musculoskeletal:  Negative for back pain and gait problem.  Skin:  Negative for rash.  Neurological:  Positive for dizziness. Negative for light-headedness and numbness.  All other systems reviewed and are negative.   Per HPI unless specifically indicated above   Allergies as of 09/18/2024   No Known Allergies      Medication List        Accurate as of September 18, 2024  9:46 AM. If you have any questions, ask your nurse or doctor.          allopurinol  300 MG tablet Commonly known as: ZYLOPRIM  Take 1 tablet (300 mg total) by mouth daily.   amLODipine  10 MG tablet Commonly known as: NORVASC  Take 1 tablet (10 mg total) by mouth daily.   atorvastatin  80 MG tablet Commonly known as: LIPITOR  Take 1 tablet (80 mg total) by mouth daily.   doxazosin  4 MG tablet Commonly known as: CARDURA  Take 0.5 tablets (2 mg total) by mouth daily. Take 1/2 tablet by mouth daily   ferrous gluconate  324 MG tablet Commonly known as: FERGON Take 324 mg by mouth 2 (two) times daily with a meal.   furosemide  20 MG tablet Commonly known as: LASIX  Take 1 tablet (20 mg total) by mouth daily.   levothyroxine  50 MCG tablet Commonly known as: SYNTHROID  Take 1 tablet (50 mcg total) by mouth daily.   metoprolol  tartrate 25 MG tablet Commonly known as: LOPRESSOR  Take 1 tablet (25 mg total) by mouth 2 (two) times  daily.   potassium chloride  SA 20 MEQ tablet Commonly known as: Klor-Con  M20 Take 1 tablet (20 mEq total) by mouth 2 (two) times daily.   rivaroxaban  20 MG Tabs tablet Commonly known as: Xarelto  Take 1 tablet (20 mg total) by mouth daily with supper.         Objective:   BP 125/66   Pulse (!) 105   Ht 5' 11 (1.803 m)   Wt 235 lb (106.6 kg)   SpO2 94%   BMI 32.78 kg/m   Wt Readings from Last 3 Encounters:  09/18/24 235 lb (106.6 kg)  08/13/24 239 lb 12.8 oz (108.8 kg)  05/07/24 241 lb (109.3 kg)    Physical Exam Physical Exam   VITALS:  BP- 125/66 NECK: Thyroid  no thyromegaly, no nodules. CHEST: Lungs clear to auscultation bilaterally. CARDIOVASCULAR: Heart regular rate and rhythm, no murmurs.         Assessment & Plan:   Problem List Items Addressed This Visit       Cardiovascular and Mediastinum   HTN (hypertension)   Relevant Medications   metoprolol  tartrate (LOPRESSOR ) 25 MG tablet   furosemide  (LASIX ) 20 MG tablet   doxazosin  (CARDURA ) 4 MG tablet   amLODipine  (NORVASC ) 10 MG tablet   atorvastatin  (LIPITOR ) 80 MG tablet   rivaroxaban  (XARELTO ) 20 MG TABS tablet   Other Relevant Orders   CBC With Diff/Platelet   CMP14+EGFR   Lipid panel   TSH   Paroxysmal atrial fibrillation (HCC)   Relevant Medications   metoprolol  tartrate (LOPRESSOR ) 25 MG tablet   furosemide  (LASIX ) 20 MG tablet   doxazosin  (CARDURA ) 4 MG tablet   amLODipine  (NORVASC ) 10 MG tablet   atorvastatin  (LIPITOR ) 80 MG tablet   rivaroxaban  (XARELTO ) 20 MG TABS tablet   Other Relevant Orders   CBC With Diff/Platelet   CMP14+EGFR   Lipid panel   TSH     Endocrine   Hypothyroidism   Relevant Medications   metoprolol  tartrate (LOPRESSOR ) 25 MG tablet   levothyroxine  (SYNTHROID ) 50 MCG tablet   Other Relevant Orders   CBC With Diff/Platelet   CMP14+EGFR   Lipid panel   TSH     Other   Pure hypercholesterolemia - Primary   Relevant Medications   metoprolol  tartrate (LOPRESSOR ) 25 MG tablet   furosemide  (LASIX ) 20 MG tablet   doxazosin  (CARDURA ) 4 MG tablet   amLODipine  (NORVASC ) 10 MG tablet   atorvastatin  (LIPITOR ) 80 MG tablet   rivaroxaban  (XARELTO ) 20 MG TABS tablet   Other Relevant Orders   CBC With Diff/Platelet   CMP14+EGFR   Lipid panel   TSH   Dyslipidemia   Relevant Medications   atorvastatin  (LIPITOR ) 80 MG tablet   Other Relevant Orders   CBC With Diff/Platelet   CMP14+EGFR   Lipid panel   TSH   Other Visit Diagnoses       Encounter for immunization       Relevant Orders   Tdap vaccine greater  than or equal to 7yo IM (Completed)          Acquired hypothyroidism Low energy and light-headedness possibly related to thyroid  function. Medication regimen unclear to him. - Ordered thyroid  function tests. - Adjust thyroid  medication based on results.  Primary hypertension Blood pressure well-controlled at 125/66 mmHg on current regimen. - Continue current antihypertensive medications. - Advised home blood pressure monitoring.  Polyosteoarthritis Joint pain and soreness exacerbated by weather changes. - Increase Tylenol  to four tablets per day, two in the  morning and two in the afternoon.  Weakness, rule out anemia Weakness with anemia considered as a potential cause. - Ordered blood work to check for anemia and other lab values. - Ensure adequate hydration.          Follow up plan: Return in about 6 months (around 03/18/2025), or if symptoms worsen or fail to improve, for Hypertension and hyperlipidemia and thyroid  recheck.  Counseling provided for all of the vaccine components Orders Placed This Encounter  Procedures   Tdap vaccine greater than or equal to 7yo IM   CBC With Diff/Platelet   CMP14+EGFR   Lipid panel   TSH    Marcus Levins, MD [facility] 09/18/2024, 9:46 AM     "

## 2024-09-28 ENCOUNTER — Ambulatory Visit: Payer: Self-pay | Admitting: Family Medicine

## 2024-12-16 ENCOUNTER — Ambulatory Visit: Admitting: Cardiology

## 2025-03-29 ENCOUNTER — Ambulatory Visit: Admitting: Family Medicine

## 2025-05-11 ENCOUNTER — Ambulatory Visit: Payer: Self-pay
